# Patient Record
Sex: Female | Born: 1985 | Race: Black or African American | Hispanic: No | Marital: Single | Smoking: Former smoker
Health system: Southern US, Community
[De-identification: ages and names within clinical notes are randomized; demographics above are authoritative.]

## PROBLEM LIST (undated history)

## (undated) DIAGNOSIS — G629 Polyneuropathy, unspecified: Secondary | ICD-10-CM

## (undated) DIAGNOSIS — E7800 Pure hypercholesterolemia, unspecified: Secondary | ICD-10-CM

## (undated) DIAGNOSIS — R51 Headache: Secondary | ICD-10-CM

## (undated) DIAGNOSIS — G8929 Other chronic pain: Secondary | ICD-10-CM

## (undated) DIAGNOSIS — F445 Conversion disorder with seizures or convulsions: Secondary | ICD-10-CM

## (undated) DIAGNOSIS — R42 Dizziness and giddiness: Secondary | ICD-10-CM

## (undated) DIAGNOSIS — M549 Dorsalgia, unspecified: Secondary | ICD-10-CM

## (undated) DIAGNOSIS — R519 Headache, unspecified: Secondary | ICD-10-CM

## (undated) DIAGNOSIS — F32A Depression, unspecified: Secondary | ICD-10-CM

## (undated) DIAGNOSIS — M1990 Unspecified osteoarthritis, unspecified site: Secondary | ICD-10-CM

## (undated) DIAGNOSIS — M797 Fibromyalgia: Secondary | ICD-10-CM

## (undated) DIAGNOSIS — F329 Major depressive disorder, single episode, unspecified: Secondary | ICD-10-CM

## (undated) DIAGNOSIS — G459 Transient cerebral ischemic attack, unspecified: Secondary | ICD-10-CM

## (undated) DIAGNOSIS — R569 Unspecified convulsions: Secondary | ICD-10-CM

## (undated) HISTORY — DX: Pure hypercholesterolemia, unspecified: E78.00

## (undated) HISTORY — DX: Unspecified osteoarthritis, unspecified site: M19.90

## (undated) HISTORY — PX: OVARIAN CYST SURGERY: SHX726

## (undated) HISTORY — DX: Dizziness and giddiness: R42

---

## 2015-02-15 ENCOUNTER — Emergency Department (HOSPITAL_COMMUNITY)
Admission: EM | Admit: 2015-02-15 | Discharge: 2015-02-15 | Disposition: A | Discharge status: Home / Self Care | Payer: Medicare (Managed Care) | Attending: Emergency Medicine | Admitting: Emergency Medicine

## 2015-02-15 ENCOUNTER — Encounter (HOSPITAL_COMMUNITY): Payer: Self-pay | Admitting: *Deleted

## 2015-02-15 DIAGNOSIS — H5711 Ocular pain, right eye: Secondary | ICD-10-CM | POA: Diagnosis present

## 2015-02-15 DIAGNOSIS — F17210 Nicotine dependence, cigarettes, uncomplicated: Secondary | ICD-10-CM | POA: Diagnosis not present

## 2015-02-15 DIAGNOSIS — Z79899 Other long term (current) drug therapy: Secondary | ICD-10-CM | POA: Diagnosis not present

## 2015-02-15 DIAGNOSIS — G629 Polyneuropathy, unspecified: Secondary | ICD-10-CM | POA: Diagnosis not present

## 2015-02-15 DIAGNOSIS — G43109 Migraine with aura, not intractable, without status migrainosus: Secondary | ICD-10-CM | POA: Insufficient documentation

## 2015-02-15 HISTORY — DX: Polyneuropathy, unspecified: G62.9

## 2015-02-15 MED ORDER — DEXAMETHASONE SODIUM PHOSPHATE 10 MG/ML IJ SOLN
10.0000 mg | Freq: Once | INTRAMUSCULAR | Status: AC
  Administered 2015-02-15: 10 mg via INTRAMUSCULAR
  Filled 2015-02-15: qty 1

## 2015-02-15 MED ORDER — FLUORESCEIN SODIUM 1 MG OP STRP
1.0000 | ORAL_STRIP | Freq: Once | OPHTHALMIC | Status: DC
  Filled 2015-02-15: qty 1

## 2015-02-15 MED ORDER — TETRACAINE HCL 0.5 % OP SOLN
2.0000 [drp] | Freq: Once | OPHTHALMIC | Status: DC
  Filled 2015-02-15: qty 2

## 2015-02-15 MED ORDER — BUTALBITAL-APAP-CAFFEINE 50-325-40 MG PO TABS: 1.0000 | ORAL_TABLET | Freq: Four times a day (QID) | ORAL | Status: DC | PRN

## 2015-02-15 MED ORDER — IBUPROFEN 400 MG PO TABS
800.0000 mg | ORAL_TABLET | Freq: Once | ORAL | Status: AC
  Administered 2015-02-15: 800 mg via ORAL
  Filled 2015-02-15: qty 2

## 2015-02-15 MED ORDER — DIPHENHYDRAMINE HCL 25 MG PO CAPS
50.0000 mg | ORAL_CAPSULE | Freq: Once | ORAL | Status: AC
  Administered 2015-02-15: 50 mg via ORAL
  Filled 2015-02-15: qty 2

## 2015-02-15 MED ORDER — KETOROLAC TROMETHAMINE 60 MG/2ML IM SOLN
60.0000 mg | Freq: Once | INTRAMUSCULAR | Status: AC
  Administered 2015-02-15: 60 mg via INTRAMUSCULAR
  Filled 2015-02-15: qty 2

## 2015-02-15 NOTE — ED Notes (Signed)
PT [facility] . Pt . Pt is now here today with same pain in the RT eye . Pt reports blurred vision in Rt .

## 2015-02-15 NOTE — ED Notes (Signed)
Declined W/C at D/C and was escorted to lobby by RN. 

## 2015-02-15 NOTE — ED Provider Notes (Signed)
CSN: [id number]     Arrival date & time 02/15/15  [id number] History  By signing my name below, I, Budd PalmerVanessa Prueter, attest that this documentation has been prepared under the direction and in the presence of PA Fayrene HelperBowie Valdez Brannan. Electronically Signed: Budd PalmerVanessa Prueter, ED Scribe. 02/15/2015. 9:29 AM.    Chief Complaint  Patient presents with  . Eye Pain   The history is provided by the patient and a relative. No language interpreter was used.   HPI Comments: Angela Doyle is a 29 y.o. female who presents to the Emergency Department complaining of right eye pain onset 2 weeks ago. She reports associated HA, neck pain, and right-sided facial pain. Pt states she was seen in the ED in [place] one week ago, where she was diagnosed with a migraine. She notes at the time, she had right sided eye pressure, as well as right sided facial and neck pain. She notes that at the time she was given anti-nausea medication and a percocet before being discharged. She reports a PMHx of migraines, but states that this feels different. She states that since being seen, the pain has progressively worsened, and she has been having vision problems as well. She notes a PMHx of seizures, with her last one occuring 3.5 weeks ago, per aunt. She notes pt just moved here and does not have a neurologist in the area yet. She states she is taking Kepra and Neurontin for this. Pt denies fever, eye discharge, and rash. Does wear corrective lenses.  Past Medical History  Diagnosis Date  . Seizures (HCC)   . Neuropathy [facility](HCC)    Past Surgical History  Procedure Laterality Date  . Cesarean section  2011   History reviewed. No pertinent family history. Social History  Substance Use Topics  . Smoking status: Current Every Day Smoker    Types: Cigarettes  . Smokeless tobacco: Never Used  . Alcohol Use: No   OB History    No data available     Review of Systems  All other systems reviewed and are negative.     Allergies   Pineapple  Home Medications   Prior to Admission medications   Medication Sig Start Date End Date Taking? Authorizing Provider  gabapentin (NEURONTIN) 300 MG capsule Take 300 mg by mouth 2 (two) times daily.   Yes Historical Provider, MD  levETIRAcetam (KEPPRA) 500 MG tablet Take 1,500 mg by mouth 2 (two) times daily.   Yes Historical Provider, MD   BP 131/79 mmHg  Pulse 78  Temp(Src) 98.3 F (36.8 C) (Oral)  Resp 18  Ht 5\' 2"  (1.575 m)  Wt 220 lb (99.791 kg)  BMI 40.23 kg/m2  SpO2 97%  LMP 02/15/2015 Physical Exam  Constitutional: She is oriented to person, place, and time. She appears well-developed and well-nourished.  HENT:  Head: Normocephalic and atraumatic.  Eyes: Conjunctivae and EOM are normal. Pupils are equal, round, and reactive to light. Right eye exhibits no discharge. Left eye exhibits no discharge. No scleral icterus.  Lids everted, no foreign object noted, no crusting.  Neck: Normal range of motion.  No nuchal rigidity  Pulmonary/Chest: Effort normal. No respiratory distress.  Neurological: She is alert and oriented to person, place, and time. She has normal strength. No cranial nerve deficit or sensory deficit. Coordination normal. GCS eye subscore is 4. GCS verbal subscore is 5. GCS motor subscore is 6.  Normal grip strength  Skin: Skin is warm and dry. No rash noted. She is not diaphoretic. No  erythema.  Psychiatric: She has a normal mood and affect.  Nursing note and vitals reviewed.   ED Course  Procedures  DIAGNOSTIC STUDIES: Oxygen Saturation is 97% on RA, adequate by my interpretation.    COORDINATION OF CARE: 9:29 AM - Discussed probable migraine. Discussed plans to order a migraine cocktail and observe. Will order fioricet. Pt advised of plan for treatment and pt agrees.    MDM   Final diagnoses:  Complicated migraine    BP 131/79 mmHg  Pulse 78  Temp(Src) 98.3 F (36.8 C) (Oral)  Resp 18  Ht  (1.575 m)  Wt 99.791 kg  BMI  40.23 kg/m2  SpO2 97%  LMP 02/15/2015  I personally performed the services described in this documentation, which was scribed in my presence. The recorded information has been reviewed and is accurate.     9:50 AM Patient presents with persistent right-sided face pain with eye pain and neck pain. She also endorsed headache. She is afebrile, vital signs stable, low suspicion for meningitis. Her eye exam is unremarkable. I suspect that this is a complicated Migraine. She has no focal neuro deficit. Will treat symptoms and refer patient to neurologist for further management. Return precaution discussed. Migraine cocktail given in the ED with improvement.    Fayrene Helper, PA-C 02/15/15 1003  Azalia Bilis, MD 02/15/15 [phone]

## 2015-02-15 NOTE — Discharge Instructions (Signed)
Recurrent Migraine Headache A migraine headache is an intense, throbbing pain on one or both sides of your head. Recurrent migraines keep coming back. A migraine can last for 30 minutes to several hours. CAUSES  The exact cause of a migraine headache is not always known. However, a migraine may be caused when nerves in the brain become irritated and release chemicals that cause inflammation. This causes pain. Certain things may also trigger migraines, such as:   Alcohol.  Smoking.  Stress.  Menstruation.  Aged cheeses.  Foods or drinks that contain nitrates, glutamate, aspartame, or tyramine.  Lack of sleep.  Chocolate.  Caffeine.  Hunger.  Physical exertion.  Fatigue.  Medicines used to treat chest pain (nitroglycerine), birth control pills, estrogen, and some blood pressure medicines. SYMPTOMS   Pain on one or both sides of your head.  Pulsating or throbbing pain.  Severe pain that prevents daily activities.  Pain that is aggravated by any physical activity.  Nausea, vomiting, or both.  Dizziness.  Pain with exposure to bright lights, loud noises, or activity.  General sensitivity to bright lights, loud noises, or smells. Before you get a migraine, you may get warning signs that a migraine is coming (aura). An aura may include:  Seeing flashing lights.  Seeing bright spots, halos, or zigzag lines.  Having tunnel vision or blurred vision.  Having feelings of numbness or tingling.  Having trouble talking.  Having muscle weakness. DIAGNOSIS  A recurrent migraine headache is often diagnosed based on:  Symptoms.  Physical examination.  A CT scan or MRI of your head. These imaging tests cannot diagnose migraines but can help rule out other causes of headaches.  TREATMENT  Medicines may be given for pain and nausea. Medicines can also be given to help prevent recurrent migraines. HOME CARE INSTRUCTIONS  Only take over-the-counter or prescription  medicines for pain or discomfort as directed by your health care provider. The use of long-term narcotics is not recommended.  Lie down in a dark, quiet room when you have a migraine.  Keep a journal to find out what may trigger your migraine headaches. For example, write down:  What you eat and drink.  How much sleep you get.  Any change to your diet or medicines.  Limit alcohol consumption.  Quit smoking if you smoke.  Get 7-9 hours of sleep, or as recommended by your health care provider.  Limit stress.  Keep lights dim if bright lights bother you and make your migraines worse. SEEK MEDICAL CARE IF:   You do not get relief from the medicines given to you.  You have a recurrence of pain.  You have a fever. SEEK IMMEDIATE MEDICAL CARE IF:  Your migraine becomes severe.  You have a stiff neck.  You have loss of vision.  You have muscular weakness or loss of muscle control.  You start losing your balance or have trouble walking.  You feel faint or pass out.  You have severe symptoms that are different from your first symptoms. MAKE SURE YOU:   Understand these instructions.  Will watch your condition.  Will get help right away if you are not doing well or get worse.   This information is not intended to replace advice given to you by your health care provider. Make sure you discuss any questions you have with your health care provider.   Document Released: 11/09/2000 Document Revised: 03/07/2014 Document Reviewed: 10/22/2012 Elsevier Interactive Patient Education [employer]   Emergency Department  Resource Guide 1) Find a Librarian, academic and Pay Out of Pocket Although you won't have to find out who is covered by your insurance plan, it is a good idea to ask around and get recommendations. You will then need to call the office and see if the doctor you have chosen will accept you as a new patient and what types of options they offer for patients who are  self-pay. Some doctors offer discounts or will set up payment plans for their patients who do not have insurance, but you will need to ask so you aren't surprised when you get to your appointment.  2) Contact Your Local Health Department Not all health departments have doctors that can see patients for sick visits, but many do, so it is worth a call to see if yours does. If you don't know where your local health department is, you can check in your phone book. The CDC also has a tool to help you locate your state's health department, and many state websites also have listings of all of their local health departments.  3) Find a Walk-in Clinic If your illness is not likely to be very severe or complicated, you may want to try a walk in clinic. These are popping up all over the country in pharmacies, drugstores, and shopping centers. They're usually staffed by nurse practitioners or physician assistants that have been trained to treat common illnesses and complaints. They're usually fairly quick and inexpensive. However, if you have serious medical issues or chronic medical problems, these are probably not your best option.  No Primary Care Doctor: - Call Health Connect at  [phone] - they can help you locate a primary care doctor that  accepts your insurance, provides certain services, etc. - Physician Referral Service- [phone]  Chronic Pain Problems: Organization         Address  Phone   Notes  [facility]  [phone] Patients need to be referred by their primary care doctor.   Medication Assistance: Organization         Address  Phone   Notes  [facility] Medication [facility] [address] [place] [place], [place] [address] [phone] --Must be a resident of [facility] -- Must have NO insurance coverage whatsoever (no Medicaid/ Medicare, etc.) -- The pt. MUST have a primary care doctor that directs their care regularly and follows them in  the community   [facility]  [phone]   [employer]  [phone]    Agencies that provide inexpensive medical care: Organization         Address  Phone   Notes  [facility]  [phone]   [facility]    [phone]   [facility] [address] [place], [place] [address] [phone]   [facility] [address] [place] [place] [phone]   Planned Parenthood    [phone]   [facility]    [phone]   [facility]  [address], [place] Phone:  [phone], Fax:  [phone] Hours of Operation:  9 am - 6 pm, M-F.  Also accepts Medicaid/Medicare and self-pay.  [facility]  [address], [place] Phone: [phone], Fax: [phone]. Hours of Operation:  8:30 am - 5:30 pm, M-F.  Also accepts Medicaid and self-pay.  [facility] [place] [address]  [place] Phone: [phone]   [facility] [address] Natasha Bence [place], [place] [phone], Ext. [phone] Mondays & Thursdays: 7-9 AM.  First 15 patients are seen on a first come, first serve basis.    Medicaid-accepting [facility] Providers:  Organization         Address  Phone   Notes  [facility] [address], [place] [phone] Also accepts self-pay patients.  [facility] [address] Laurell Josephs [place], [place]  [phone]   [facility] [address], [place] [phone]   [facility] [address], [place] [phone]   Renaye Rakers [address], [place]   [phone] Only accepts [place] patients after they have their name applied to their card.   Self-Pay (no insurance) in [facility]:  Organization         Address  Phone   Notes  Sickle Cell Patients,  [facility] [address] [place], [place] [phone]   [facility] [address] [place], [place] [phone]   [facility]  [address], [place] [phone]   [facility]/Dr. Osei-Bonsu  [address], [place] or [address], [place] [phone] Phone number for both [place] and [place] locations is the same.  [facility] [address] [place] [phone]   [facility] [address], [place] or [address] [phone]   [facility] [address], [place] [phone], phone; [phone], fax Sees patients 1st and 3rd Saturday of every month.  Must not qualify for public or private insurance (i.e. Medicaid, Medicare, [place] Health Choice, Veterans' Benefits)  Household income should be no more than 200% of the poverty level The clinic cannot treat you if you are pregnant or think you are pregnant  Sexually transmitted diseases are not treated at the clinic.    Dental Care: Organization         Address  Phone  Notes  [facility] Department of [facility] [address] [place], [place] [phone] Accepts children up to age 72 who are enrolled in [place] or [place] Health Choice; pregnant women with a Medicaid card; and children who have applied for Medicaid or [place] Health Choice, but were declined, whose parents can pay a reduced fee at time of service.  [facility] of [facility]  [address], [place] [phone] Accepts children up to age 21 who are enrolled in [place] or [place] Health Choice; pregnant women with a Medicaid card; and children who have applied for Medicaid or [place] Health Choice, but were declined, whose parents can pay a reduced fee at time of service.  [facility]   [address] [place], [place] [phone] Patients are seen by appointment only. Walk-ins are not accepted. [facility] will see patients 66 years of age and older. Monday - Tuesday (8am-5pm) Most Wednesdays (8:30-5pm) $30 per visit, cash only  [facility]  [address], [facility] [phone] Patients are seen by appointment only. Walk-ins are not accepted. [facility] will see patients 98 years of age and older. One Wednesday  Evening (Monthly: Volunteer Based).  $30 per visit, cash only  [employer]  [phone] for adults; Children under age 13, call Graduate Pediatric Dentistry at [phone]. Children aged 59-14, please call [phone] to request a pediatric application.  Dental services are provided in all areas of dental care including fillings, crowns and bridges, complete and partial dentures, implants, gum treatment, root canals, and extractions. Preventive care is also provided. Treatment is provided to both adults and children. Patients are selected via a lottery and there is often a waiting list.   [facility] [address], [place]  [phone] [email or web address]   [facility] [address] [place], [place] [phone], Ext. [phone] Second and Fourth Thursday of each month, opens at 6:30 AM; Clinic ends at 9 AM.  Patients are seen on a first-come first-served basis, and a limited number are seen during each clinic.   [facility]  [address] Ether Griffins [place], [place] [phone]   Eligibility Requirements You must have lived in [place], [place], or [place] counties for at least the last three months.   You cannot be eligible for state or federal sponsored [place], including [employer], [place], or [employer].   You generally cannot be eligible for healthcare insurance through your employer.    How to apply: Eligibility screenings are  held every Tuesday and Wednesday afternoon from 1:00 pm until 4:00 pm. You do not need an appointment for the interview!  [facility] [address] [place], [place] [phone]   [facility] Health Department  [phone]   [facility] Health Department  [phone]   [facility] Health Department  [phone]    Behavioral Health Resources in the Community: Intensive Outpatient Programs Organization         Address  Phone  Notes  [facility] [address] [place], [place] [phone]   [facility] Outpatient [address] [place], [place] [phone]   [facility]: [facility] [address] [place], [place]  [phone]   [facility] Mental Health [address],  [place], [place] [phone] or [phone]   Substance Abuse Resources Organization         Address  Phone  Notes  [facility] Drug Services  [phone]   [facility]  [phone]   [facility] [place]  [phone]   Floydene Flock  [phone]   Residential & Outpatient Substance Abuse Program  [phone]   Psychological Services Organization         Address  Phone  Notes  [facility] Behavioral Health  [phone]   [facility]  [phone]   [facility] Mental Health [address] [place] [phone] or [phone]    Mobile Crisis Teams Organization         Address  Phone  Notes  [facility], Mobile Crisis Care Unit  [phone]   [facility]  [address] [place], [place] [phone]   Doristine Locks [address] [place] [phone]    Self-Help/Support Groups Organization         Address  Phone             Notes  [facility] [place] - variety of support groups  336- I7437963 Call for more information  Narcotics Anonymous (NA), [facility] [address], [employer] [place]  2 meetings at this location  Residential Treatment Programs Organization         Address  Phone  Notes  [facility] [address],    [place]  [phone]   [facility]  [address] [place] [phone], [place], [place] [phone]   [facility] [address], [place] [phone] Admissions: 8am-3pm M-F  [facility] [address]    [place], [place] [phone]   [facility] [address], [place], [place] [phone]   [facility] [address]  [place], [place] [phone]   [facility] - Intensive Outpatient [address], [place], [place] [phone]   [facility] ([facility] [address]  [place], [place] [phone] or [phone]   [facility] ([facility]) [address] [place], [place] [phone] Accepts Medicaid  [facility] [address]  [place] [phone] Substance Abuse/Addiction Treatment   [facility] Behavioral Health Resources Organization         Address  Phone  Notes  [facility]  [phone]) [phone]   Angie FavaJulie Brannon, PhD [address] [place], [place]   [phone](336) [phone] or [phone](336) [phone]   [facility]   [address] [place], [place] [phone]) [phone]   [facility] [address], [place], [place] [phone]) [phone] Insurance/Medicaid/sponsorship through [facility]  [facility] [address]                                    [place], [place] [phone]) [phone] Therapy/tele-psych/case  [facility] [address]   [place], [place] [phone](336) [phone]    Dr. Lolly MustacheArfeen  [phone](336) [phone]   [facility]  [employer] [facility] 1) [address], [place] 2) [address], [place] 3)  [address], [place] [phone](336) [phone](336) [phone]  [phone]([phone]   [facility] Child Abuse Hotline [phone](336) [phone] or [phone](336) [phone] (After  Hours)

## 2015-03-25 ENCOUNTER — Emergency Department (HOSPITAL_COMMUNITY)
Admission: EM | Admit: 2015-03-25 | Discharge: 2015-03-26 | Disposition: A | Discharge status: Home / Self Care | Payer: Medicaid Other | Attending: Emergency Medicine | Admitting: Emergency Medicine

## 2015-03-25 ENCOUNTER — Encounter (HOSPITAL_COMMUNITY): Payer: Self-pay | Admitting: Emergency Medicine

## 2015-03-25 DIAGNOSIS — F17210 Nicotine dependence, cigarettes, uncomplicated: Secondary | ICD-10-CM | POA: Insufficient documentation

## 2015-03-25 DIAGNOSIS — G629 Polyneuropathy, unspecified: Secondary | ICD-10-CM | POA: Insufficient documentation

## 2015-03-25 DIAGNOSIS — Z79899 Other long term (current) drug therapy: Secondary | ICD-10-CM | POA: Insufficient documentation

## 2015-03-25 DIAGNOSIS — Z760 Encounter for issue of repeat prescription: Secondary | ICD-10-CM | POA: Insufficient documentation

## 2015-03-25 MED ORDER — LACOSAMIDE 50 MG PO TABS
100.0000 mg | ORAL_TABLET | Freq: Once | ORAL | Status: AC
  Administered 2015-03-25: 100 mg via ORAL
  Filled 2015-03-25: qty 2

## 2015-03-25 NOTE — ED Notes (Signed)
Patient alert, oriented, in no acute distress

## 2015-03-25 NOTE — ED Notes (Signed)
Patient visitor at bedside given peanut butter & saltine crackers per request.

## 2015-03-25 NOTE — ED Notes (Signed)
Patient presents for medication refill for vimpat. Ran out two days ago, setting up medicaid. No seizure since running out of vimpat. Patient A&O x4, ambulatory with steady gait in triage. C/o HA, denies visual changes.

## 2015-03-26 MED ORDER — LEVETIRACETAM 500 MG PO TABS: 1500.0000 mg | ORAL_TABLET | Freq: Two times a day (BID) | ORAL | Status: DC

## 2015-03-26 MED ORDER — LACOSAMIDE 100 MG PO TABS: 100.0000 mg | ORAL_TABLET | Freq: Two times a day (BID) | ORAL | Status: DC

## 2015-03-26 NOTE — Discharge Instructions (Signed)
Medicine Refill at the Emergency Department  We have refilled your medicine today, but it is best for you to get refills through your primary health care provider's office. In the future, please plan ahead so you do not need to get refills from the emergency department.  If the medicine we refilled was a maintenance medicine, you may have received only enough to get you by until you are able to see your regular health care provider.     This information is not intended to replace advice given to you by your health care provider. Make sure you discuss any questions you have with your health care provider.     Document Released: 06/03/2003 Document Revised: 03/07/2014 Document Reviewed: 05/24/2013  Elsevier Interactive Patient Education 2016 Elsevier Inc.

## 2015-03-26 NOTE — ED Provider Notes (Signed)
CSN: [id number]     Arrival date & time 03/25/15  2115 History   First MD Initiated Contact with Patient 03/25/15 2254     Chief Complaint  Patient presents with  . Medication Refill     (Consider location/radiation/quality/duration/timing/severity/associated sxs/prior Treatment) HPI Comments: Patient presents to the emergency department with chief complaint of medication refill. She states that she is new to the area. She has run out of her Vimpat. She takes this for seizures. She has not had her medication several days. She denies any new seizures. She denies any symptoms at this time. She states that occasionally she feels jittery. She denies any fevers, chills, recent illness.  The history is provided by the patient. No language interpreter was used.    Past Medical History  Diagnosis Date  . Seizures (HCC)   . Neuropathy [facility])    Past Surgical History  Procedure Laterality Date  . Cesarean section  2011   No family history on file. Social History  Substance Use Topics  . Smoking status: Current Every Day Smoker    Types: Cigarettes  . Smokeless tobacco: Never Used  . Alcohol Use: No   OB History    No data available     Review of Systems  All other systems reviewed and are negative.     Allergies  Pineapple  Home Medications   Prior to Admission medications   Medication Sig Start Date End Date Taking? Authorizing Provider  butalbital-acetaminophen-caffeine (FIORICET) [phone] MG tablet Take 1-2 tablets by mouth every 6 (six) hours as needed for headache. 02/15/15 02/15/16 Yes Fayrene Helper, PA-C  cyclobenzaprine (FLEXERIL) 10 MG tablet Take 10 mg by mouth 3 (three) times daily as needed for muscle spasms.    Yes Historical Provider, MD  gabapentin (NEURONTIN) 300 MG capsule Take 300 mg by mouth 3 (three) times daily.    Yes Historical Provider, MD  levETIRAcetam (KEPPRA) 500 MG tablet Take 1,500 mg by mouth 2 (two) times daily.   Yes Historical Provider, MD   meclizine (ANTIVERT) 12.5 MG tablet Take 12.5 mg by mouth 3 (three) times daily as needed for dizziness.   Yes Historical Provider, MD  sertraline (ZOLOFT) 100 MG tablet Take 100 mg by mouth daily.   Yes Historical Provider, MD  Lacosamide (VIMPAT) 100 MG TABS Take 1 tablet (100 mg total) by mouth 2 (two) times daily. 03/26/15   Roxy Horseman, PA-C   BP 113/77 mmHg  Pulse 74  Temp(Src) 98.5 F (36.9 C) (Oral)  Resp 16  SpO2 98% Physical Exam  Constitutional: She is oriented to person, place, and time. She appears well-developed and well-nourished.  HENT:  Head: Normocephalic and atraumatic.  Eyes: Conjunctivae and EOM are normal. Pupils are equal, round, and reactive to light.  Neck: Normal range of motion. Neck supple.  Cardiovascular: Normal rate and regular rhythm.  Exam reveals no gallop and no friction rub.   No murmur heard. Pulmonary/Chest: Effort normal and breath sounds normal. No respiratory distress. She has no wheezes. She has no rales. She exhibits no tenderness.  Abdominal: Soft. Bowel sounds are normal. She exhibits no distension and no mass. There is no tenderness. There is no rebound and no guarding.  Musculoskeletal: Normal range of motion. She exhibits no edema or tenderness.  Neurological: She is alert and oriented to person, place, and time.  Skin: Skin is warm and dry.  Psychiatric: She has a normal mood and affect. Her behavior is normal. Judgment and thought content normal.  Nursing note and vitals reviewed.   ED Course  Procedures (including critical care time)   MDM   Final diagnoses:  Medication refill    Patient here for refill of seizure medicine. Refill in the ED. Recommend primary care/neurology follow-up. Patient understands and agrees with plan. She is stable and ready for discharge.    Roxy Horseman, PA-C 03/26/15 0015  Cy Blamer, MD 03/26/15 [phone]

## 2015-03-26 NOTE — ED Notes (Signed)
Discharge instructions, follow up care, and rx x2 reviewed with patient. Patient verbalized understanding. 

## 2015-03-30 ENCOUNTER — Encounter (HOSPITAL_COMMUNITY): Payer: Self-pay

## 2015-03-30 ENCOUNTER — Emergency Department (HOSPITAL_COMMUNITY)
Admission: EM | Admit: 2015-03-30 | Discharge: 2015-03-30 | Disposition: A | Discharge status: Home / Self Care | Payer: Medicaid Other | Attending: Emergency Medicine | Admitting: Emergency Medicine

## 2015-03-30 DIAGNOSIS — F17210 Nicotine dependence, cigarettes, uncomplicated: Secondary | ICD-10-CM | POA: Insufficient documentation

## 2015-03-30 DIAGNOSIS — G40909 Epilepsy, unspecified, not intractable, without status epilepticus: Secondary | ICD-10-CM | POA: Insufficient documentation

## 2015-03-30 DIAGNOSIS — Z79899 Other long term (current) drug therapy: Secondary | ICD-10-CM | POA: Diagnosis not present

## 2015-03-30 DIAGNOSIS — R569 Unspecified convulsions: Secondary | ICD-10-CM | POA: Diagnosis present

## 2015-03-30 DIAGNOSIS — Z3202 Encounter for pregnancy test, result negative: Secondary | ICD-10-CM | POA: Insufficient documentation

## 2015-03-30 LAB — BLOOD GAS, VENOUS

## 2015-03-30 LAB — I-STAT VENOUS BLOOD GAS, ED
ACID-BASE DEFICIT: 2 mmol/L (ref 0.0–2.0)
Bicarbonate: 24.8 mEq/L — ABNORMAL HIGH (ref 20.0–24.0)
O2 SAT: 46 %
PCO2 VEN: 50.8 mmHg — AB (ref 45.0–50.0)
TCO2: 26 mmol/L (ref 0–100)
pH, Ven: 7.297 (ref 7.250–7.300)
pO2, Ven: 28 mmHg — CL (ref 30.0–45.0)

## 2015-03-30 LAB — COMPREHENSIVE METABOLIC PANEL
ALBUMIN: 3.9 g/dL (ref 3.5–5.0)
ALK PHOS: 82 U/L (ref 38–126)
ALT: 24 U/L (ref 14–54)
AST: 24 U/L (ref 15–41)
Anion gap: 13 (ref 5–15)
BILIRUBIN TOTAL: 0.7 mg/dL (ref 0.3–1.2)
BUN: 9 mg/dL (ref 6–20)
CO2: 24 mmol/L (ref 22–32)
CREATININE: 0.94 mg/dL (ref 0.44–1.00)
Calcium: 9.4 mg/dL (ref 8.9–10.3)
Chloride: 105 mmol/L (ref 101–111)
GFR calc Af Amer: >60 mL/min (ref >60)
GFR calc non Af Amer: >60 mL/min (ref >60)
GLUCOSE: 98 mg/dL (ref 65–99)
POTASSIUM: 4 mmol/L (ref 3.5–5.1)
Sodium: 142 mmol/L (ref 135–145)
TOTAL PROTEIN: 7.3 g/dL (ref 6.5–8.1)

## 2015-03-30 LAB — RAPID URINE DRUG SCREEN, HOSP PERFORMED
Amphetamines: NOT DETECTED
BARBITURATES: NOT DETECTED
Benzodiazepines: POSITIVE — AB
Cocaine: NOT DETECTED
OPIATES: NOT DETECTED
Tetrahydrocannabinol: NOT DETECTED

## 2015-03-30 LAB — CBC WITH DIFFERENTIAL/PLATELET
BASOS ABS: 0 10*3/uL (ref 0.0–0.1)
Basophils Relative: 0 %
EOS PCT: 1 %
Eosinophils Absolute: 0.1 10*3/uL (ref 0.0–0.7)
HCT: 43.8 % (ref 36.0–46.0)
Hemoglobin: 15 g/dL (ref 12.0–15.0)
LYMPHS PCT: 35 %
Lymphs Abs: 2.4 10*3/uL (ref 0.7–4.0)
MCH: 31.7 pg (ref 26.0–34.0)
MCHC: 34.2 g/dL (ref 30.0–36.0)
MCV: 92.6 fL (ref 78.0–100.0)
MONO ABS: 0.7 10*3/uL (ref 0.1–1.0)
Monocytes Relative: 11 %
Neutro Abs: 3.5 10*3/uL (ref 1.7–7.7)
Neutrophils Relative %: 53 %
PLATELETS: 185 10*3/uL (ref 150–400)
RBC: 4.73 MIL/uL (ref 3.87–5.11)
RDW: 12.8 % (ref 11.5–15.5)
WBC: 6.7 10*3/uL (ref 4.0–10.5)

## 2015-03-30 LAB — POC URINE PREG, ED: PREG TEST UR: NEGATIVE

## 2015-03-30 LAB — CK: CK TOTAL: 439 U/L — AB (ref 38–234)

## 2015-03-30 LAB — CBG MONITORING, ED: Glucose-Capillary: 87 mg/dL (ref 65–99)

## 2015-03-30 MED ORDER — SODIUM CHLORIDE 0.9 % IV SOLN
200.0000 mg | Freq: Two times a day (BID) | INTRAVENOUS | Status: DC
  Administered 2015-03-30: 200 mg via INTRAVENOUS
  Filled 2015-03-30 (×2): qty 20

## 2015-03-30 MED ORDER — SODIUM CHLORIDE 0.9 % IV SOLN
1000.0000 mg | Freq: Once | INTRAVENOUS | Status: DC
  Filled 2015-03-30: qty 10

## 2015-03-30 MED ORDER — FENTANYL CITRATE (PF) 100 MCG/2ML IJ SOLN
100.0000 ug | Freq: Once | INTRAMUSCULAR | Status: AC
  Administered 2015-03-30: 100 ug via INTRAVENOUS
  Filled 2015-03-30: qty 2

## 2015-03-30 MED ORDER — MECLIZINE HCL 25 MG PO TABS: 12.5000 mg | ORAL_TABLET | Freq: Two times a day (BID) | ORAL | Status: DC | PRN

## 2015-03-30 MED ORDER — LEVETIRACETAM 500 MG PO TABS: 1500.0000 mg | ORAL_TABLET | Freq: Two times a day (BID) | ORAL | Status: DC

## 2015-03-30 MED ORDER — MECLIZINE HCL 12.5 MG PO TABS: 12.5000 mg | ORAL_TABLET | Freq: Three times a day (TID) | ORAL | Status: DC | PRN

## 2015-03-30 MED ORDER — LACOSAMIDE 100 MG PO TABS: 1.0000 | ORAL_TABLET | Freq: Two times a day (BID) | ORAL | Status: DC

## 2015-03-30 NOTE — ED Notes (Signed)
Pt verbal at this time but continues to have involuntary movements/twitching of neck.  MD made aware.

## 2015-03-30 NOTE — Discharge Planning (Signed)
Pt provided savings card for Vimpat.  Pt aware that she may visit [facility] to fill Rx and apply for additional savings programs.

## 2015-03-30 NOTE — ED Notes (Signed)
Phlebotomy at bedside.

## 2015-03-30 NOTE — Progress Notes (Signed)
Spoke to patient regarding primary care resources and the [facility] orange card. Patient has not met the minimum requirement of being a [employer] resident at this time to be a candidate for the orange card. Follow up appointment made with the [facility] for Friday March 3,2017 @ 10:45am, pt verbalized understanding of the upcoming appointment. Family at bedside states they have started the medicaid process here in [place]. Patient was instructed to contact me in 2 weeks if no response was given from medicaid to proceed with orange card process. My contact information provided for any future questions or concerns. No other [place] needs identified at this time. NCM C.Wood consulted regarding patients medications.   [place] Specialist [facility] [phone]

## 2015-03-30 NOTE — Discharge Instructions (Signed)
Epilepsy Call the sickle cell clinic tomorrow arrange to get it primary care physician. You may get your medications tomorrow at the [facility] and [facility] People with epilepsy have times when they shake and jerk uncontrollably (seizures). This happens when there is a sudden change in brain function. Epilepsy may have many possible causes. Anything that disturbs the normal pattern of brain cell activity can lead to seizures. HOME CARE   Follow your doctor's instructions about driving and safety during normal activities.  Get enough sleep.  Only take medicine as told by your doctor.  Avoid things that you know can cause you to have seizures (triggers).  Write down when your seizures happen and what you remember about each seizure. Write down anything you think may have caused the seizure to happen.  Tell the people you live and work with that you have seizures. Make sure they know how to help you. They should:  Cushion your head and body.  Turn you on your side.  Not restrain you.  Not place anything inside your mouth.  Call for local emergency medical help if there is any question about what has happened.  Keep all follow-up visits with your doctor. This is very important. GET HELP IF:  You get an infection or start to feel sick. You may have more seizures when you are sick.  You are having seizures more often.  Your seizure pattern is changing. GET HELP RIGHT AWAY IF:   A seizure does not stop after a few seconds or minutes.  A seizure causes you to have trouble breathing.  A seizure gives you a very bad headache.  A seizure makes you unable to speak or use a part of your body.   This information is not intended to replace advice given to you by your health care provider. Make sure you discuss any questions you have with your health care provider.   Document Released: 12/12/2008 Document Revised: 12/05/2012 Document Reviewed: 09/26/2012 Elsevier  Interactive Patient Education [employer]

## 2015-03-30 NOTE — ED Notes (Signed)
Pt brought in EMS for 3 seizures today.  Pt is normally on Keppra and Vimpac but family reports pt has been out of medications due to insurance issues.  Seizures are tonic clonic and pt had 1 witnessed seizure with EMS.  Pt given 2.5mg  Valium and is sleeping with even and unlabored respirations upon arrival

## 2015-03-30 NOTE — ED Provider Notes (Signed)
CSN: [id number]     Arrival date & time 03/30/15  1128 History   First MD Initiated Contact with Patient 03/30/15 1152     Chief Complaint  Patient presents with  . Seizures     (Consider location/radiation/quality/duration/timing/severity/associated sxs/prior Treatment) HPI Level V caveat patient unresponsive history is obtained from EMS here to I attempted to call patient's telephone, no answer. Patient reportedly had 3 seizures while at home today. She had been out of Keppra and Vimpat 1 week. EMS treated patient with Valium 2.5 mill igrams IV in the field.  Past Medical History  Diagnosis Date  . Seizures (HCC)   . Neuropathy [facility])    Past Surgical History  Procedure Laterality Date  . Cesarean section  2011   History reviewed. No pertinent family history. Social History  Substance Use Topics  . Smoking status: Current Every Day Smoker    Types: Cigarettes  . Smokeless tobacco: Never Used  . Alcohol Use: No   OB History    No data available     Review of Systems  Unable to perform ROS: Patient unresponsive  Neurological: Positive for seizures.      Allergies  Pineapple  Home Medications   Prior to Admission medications   Medication Sig Start Date End Date Taking? Authorizing Provider  butalbital-acetaminophen-caffeine (FIORICET) [phone] MG tablet Take 1-2 tablets by mouth every 6 (six) hours as needed for headache. 02/15/15 02/15/16  Fayrene Helper, PA-C  cyclobenzaprine (FLEXERIL) 10 MG tablet Take 10 mg by mouth 3 (three) times daily as needed for muscle spasms.     Historical Provider, MD  gabapentin (NEURONTIN) 300 MG capsule Take 300 mg by mouth 3 (three) times daily.     Historical Provider, MD  Lacosamide (VIMPAT) 100 MG TABS Take 1 tablet (100 mg total) by mouth 2 (two) times daily. 03/26/15   Roxy Horseman, PA-C  levETIRAcetam (KEPPRA) 500 MG tablet Take 3 tablets (1,500 mg total) by mouth 2 (two) times daily. 03/26/15   Roxy Horseman, PA-C  meclizine  (ANTIVERT) 12.5 MG tablet Take 12.5 mg by mouth 3 (three) times daily as needed for dizziness.    Historical Provider, MD  sertraline (ZOLOFT) 100 MG tablet Take 100 mg by mouth daily.    Historical Provider, MD   BP 114/80 mmHg  Pulse 71  Temp(Src) 97.9 F (36.6 C) (Oral)  Resp 25  Ht  (1.702 m)  Wt 210 lb (95.255 kg)  BMI 32.88 kg/m2  SpO2 98% Physical Exam  Constitutional:  Unresponsive. Opens eyes to noxious stimulus. Does not follow commands. Nonverbal. Glasgow Coma Score equals 7  HENT:  Head: Normocephalic and atraumatic.  Eyes: Conjunctivae are normal.  Pupils 2 mm reactive to light  Neck: Neck supple. No tracheal deviation present. No thyromegaly present.  Cardiovascular: Normal rate and regular rhythm.   No murmur heard. Pulmonary/Chest: Effort normal and breath sounds normal.  Abdominal: Soft. Bowel sounds are normal. She exhibits no distension. There is no tenderness.  Obese  Musculoskeletal: Normal range of motion. She exhibits no edema or tenderness.  Neurological: Coordination normal.  Unresponsive Glasgow Coma Scale score 7spontaneous movement of her extremities. Does not follow commands  Skin: Skin is warm and dry. No rash noted.  Nursing note and vitals reviewed.  12:20 PM patient noted have mild generalized seizure activity. She is managing her airway.. Intravenous Vimpat ordered.  Patient's aunt with whom she lives arrived and provided further history. Patient has not had any Vimpat in 13 days.  She currently does have Keppra and gabapentin has only for Keppra tablets left but has had this morning's dose of Keppra. She's had similar episodes in the past after missing her medications.   1:40 AM patient is alert Glasgow Coma Score 15 complains of diffuse body aches. No longer having seizure activity after treatment with intravenous Vimpat  At 430 pm pt alert ambulates without assistance and feels ready to go home. Alert gcs 15Pain improved after treatment  with iv opioid Case mangement was consulted and made arrangements for pt to get Rxs for vimpat and keppra free of charge . Walker offered to pt as she had one  When sheli ved in [place] but she declined it ED Course  Procedures (including critical care time) Labs Review Labs Reviewed  CBG MONITORING, ED    Imaging Review No results found. I have personally reviewed and evaluated these images and lab results as part of my medical decision-making.   EKG Interpretation None     Results for orders placed or performed during the hospital encounter of 03/30/15  Blood gas, venous  Result Value Ref Range   FIO2 TEST WILL BE CREDITED    O2 Content TEST WILL BE CREDITED L/min   Delivery systems TEST WILL BE CREDITED    Mode TEST WILL BE CREDITED    VT TEST WILL BE CREDITED mL   LHR TEST WILL BE CREDITED resp/min   Hi Frequency JET Vent Rate TEST WILL BE CREDITED    Peep/cpap TEST WILL BE CREDITED cm H20   PIP TEST WILL BE CREDITED cm H2O   Hi Frequency JET Vent PIP TEST WILL BE CREDITED    Pressure support TEST WILL BE CREDITED cm H20   Pressure control TEST WILL BE CREDITED cm H20   pH, Ven TEST WILL BE CREDITED 7.250 - 7.300   pCO2, Ven TEST WILL BE CREDITED 45.0 - 50.0 mmHg   pO2, Ven TEST WILL BE CREDITED 30.0 - 45.0 mmHg   Bicarbonate TEST WILL BE CREDITED 20.0 - 24.0 mEq/L   TCO2 TEST WILL BE CREDITED 0 - 100 mmol/L   Acid-Base Excess TEST WILL BE CREDITED 0.0 - 2.0 mmol/L   Acid-base deficit TEST WILL BE CREDITED 0.0 - 2.0 mmol/L   O2 Saturation TEST WILL BE CREDITED %   Patient temperature TEST WILL BE CREDITED    Amplitude TEST WILL BE CREDITED    Map TEST WILL BE CREDITED cmH20   Hertz TEST WILL BE CREDITED    Nitric Oxide TEST WILL BE CREDITED    Collection site TEST WILL BE CREDITED    Drawn by TEST WILL BE CREDITED    Sample type TEST WILL BE CREDITED    Mechanical Rate TEST WILL BE CREDITED   Comprehensive metabolic panel  Result Value Ref Range   Sodium 142 135 -  145 mmol/L   Potassium 4.0 3.5 - 5.1 mmol/L   Chloride 105 101 - 111 mmol/L   CO2 24 22 - 32 mmol/L   Glucose, Bld 98 65 - 99 mg/dL   BUN 9 6 - 20 mg/dL   Creatinine, Ser 1.61 0.44 - 1.00 mg/dL   Calcium 9.4 8.9 - 09.6 mg/dL   Total Protein 7.3 6.5 - 8.1 g/dL   Albumin 3.9 3.5 - 5.0 g/dL   AST 24 15 - 41 U/L   ALT 24 14 - 54 U/L   Alkaline Phosphatase 82 38 - 126 U/L   Total Bilirubin 0.7 0.3 - 1.2 mg/dL  GFR calc non Af Amer >60 >60 mL/min   GFR calc Af Amer >60 >60 mL/min   Anion gap 13 5 - 15  CBC with Differential/Platelet  Result Value Ref Range   WBC 6.7 4.0 - 10.5 K/uL   RBC 4.73 3.87 - 5.11 MIL/uL   Hemoglobin 15.0 12.0 - 15.0 g/dL   HCT 96.0 45.4 - 09.8 %   MCV 92.6 78.0 - 100.0 fL   MCH 31.7 26.0 - 34.0 pg   MCHC 34.2 30.0 - 36.0 g/dL   RDW 11.9 14.7 - 82.9 %   Platelets 185 150 - 400 K/uL   Neutrophils Relative % 53 %   Neutro Abs 3.5 1.7 - 7.7 K/uL   Lymphocytes Relative 35 %   Lymphs Abs 2.4 0.7 - 4.0 K/uL   Monocytes Relative 11 %   Monocytes Absolute 0.7 0.1 - 1.0 K/uL   Eosinophils Relative 1 %   Eosinophils Absolute 0.1 0.0 - 0.7 K/uL   Basophils Relative 0 %   Basophils Absolute 0.0 0.0 - 0.1 K/uL  Urine rapid drug screen (hosp performed)  Result Value Ref Range   Opiates NONE DETECTED NONE DETECTED   Cocaine NONE DETECTED NONE DETECTED   Benzodiazepines POSITIVE (A) NONE DETECTED   Amphetamines NONE DETECTED NONE DETECTED   Tetrahydrocannabinol NONE DETECTED NONE DETECTED   Barbiturates NONE DETECTED NONE DETECTED  CK  Result Value Ref Range   Total CK 439 (H) 38 - 234 U/L  CBG monitoring, ED  Result Value Ref Range   Glucose-Capillary 87 65 - 99 mg/dL  I-Stat venous blood gas, ED  Result Value Ref Range   pH, Ven 7.297 7.250 - 7.300   pCO2, Ven 50.8 (H) 45.0 - 50.0 mmHg   pO2, Ven 28.0 (LL) 30.0 - 45.0 mmHg   Bicarbonate 24.8 (H) 20.0 - 24.0 mEq/L   TCO2 26 0 - 100 mmol/L   O2 Saturation 46.0 %   Acid-base deficit 2.0 0.0 - 2.0 mmol/L    Patient temperature HIDE    Sample type VENOUS    Comment NOTIFIED PHYSICIAN   POC urine preg, ED (not at [facility])  Result Value Ref Range   Preg Test, Ur NEGATIVE NEGATIVE   No results found.  MDM  Pt had sz secondary to missed doses of antiepilectics . Mildly elevated ck d/t mild rhabdomyolysis from sz. Should clearspontaneously,as pt wit normal renal function  Final diagnoses:  None   Plan Rx keppra , vimpat, meclizine (pt requests rx as suffers from chronic vertigo), [facility] to get PMD Dx Seizure disorder     Doug Sou, MD 03/30/15 1755

## 2015-03-30 NOTE — ED Notes (Signed)
Called pharmacy regarding Vimpat, reports they are still mixing it and will send it up ASAP

## 2015-03-30 NOTE — ED Notes (Signed)
Pt now calm and resting comfortably.  Family remains at bedside and vitals remain stable.

## 2015-03-31 MED FILL — VIMPAT 100 MG TABLET: 100 | 14 days supply | Qty: 28 | Fill #0

## 2015-03-31 MED FILL — TRAVEL SICKNESS 25 MG TAB C: 25 | 30 days supply | Qty: 30 | Fill #0

## 2015-03-31 MED FILL — ?LEVETIRACETAM 500 MG TABLE: 500 | 30 days supply | Qty: 180 | Fill #0

## 2015-04-17 ENCOUNTER — Encounter (HOSPITAL_COMMUNITY): Payer: Self-pay | Admitting: Emergency Medicine

## 2015-04-17 ENCOUNTER — Telehealth: Payer: Self-pay | Admitting: *Deleted

## 2015-04-17 ENCOUNTER — Emergency Department (HOSPITAL_COMMUNITY)
Admission: EM | Admit: 2015-04-17 | Discharge: 2015-04-17 | Disposition: A | Discharge status: Home / Self Care | Payer: Medicaid - Out of State | Attending: Emergency Medicine | Admitting: Emergency Medicine

## 2015-04-17 DIAGNOSIS — R200 Anesthesia of skin: Secondary | ICD-10-CM | POA: Diagnosis present

## 2015-04-17 DIAGNOSIS — M79602 Pain in left arm: Secondary | ICD-10-CM | POA: Diagnosis not present

## 2015-04-17 DIAGNOSIS — F17210 Nicotine dependence, cigarettes, uncomplicated: Secondary | ICD-10-CM | POA: Insufficient documentation

## 2015-04-17 MED FILL — levETIRAcetam 500 MG TABS: 500 | 30 days supply | Qty: 180 | Fill #0

## 2015-04-17 NOTE — ED Notes (Signed)
Unable to locate when called for room x3 

## 2015-04-17 NOTE — ED Notes (Signed)
Pt reports left arm pain and numbness for the last 2 weeks. Pt moving all extremities equally. No neuro deficits, alert, oriented 4, VSS.

## 2015-04-20 MED FILL — VIMPAT 100 MG TABLET: 100 | 30 days supply | Qty: 60 | Fill #0

## 2015-05-01 ENCOUNTER — Ambulatory Visit: Payer: Medicaid - Out of State | Admitting: Family Medicine

## 2015-05-13 ENCOUNTER — Ambulatory Visit: Payer: Medicaid - Out of State | Admitting: Family Medicine

## 2015-05-16 ENCOUNTER — Encounter (HOSPITAL_COMMUNITY): Payer: Self-pay | Admitting: Certified Nurse Midwife

## 2015-05-16 ENCOUNTER — Inpatient Hospital Stay (HOSPITAL_COMMUNITY): Payer: Medicaid Other

## 2015-05-16 ENCOUNTER — Inpatient Hospital Stay (HOSPITAL_COMMUNITY)
Admission: AD | Admit: 2015-05-16 | Discharge: 2015-05-16 | Disposition: A | Discharge status: Home / Self Care | Payer: Medicaid Other | Source: Ambulatory Visit | Attending: Obstetrics & Gynecology | Admitting: Obstetrics & Gynecology

## 2015-05-16 DIAGNOSIS — R1030 Lower abdominal pain, unspecified: Secondary | ICD-10-CM | POA: Diagnosis not present

## 2015-05-16 DIAGNOSIS — R102 Pelvic and perineal pain: Secondary | ICD-10-CM | POA: Insufficient documentation

## 2015-05-16 HISTORY — DX: Headache: R51

## 2015-05-16 HISTORY — DX: Headache, unspecified: R51.9

## 2015-05-16 LAB — WET PREP, GENITAL
Sperm: NONE SEEN
TRICH WET PREP: NONE SEEN
YEAST WET PREP: NONE SEEN

## 2015-05-16 LAB — URINALYSIS, ROUTINE W REFLEX MICROSCOPIC
BILIRUBIN URINE: NEGATIVE
Glucose, UA: NEGATIVE mg/dL
KETONES UR: NEGATIVE mg/dL
NITRITE: NEGATIVE
PH: 6.5 (ref 5.0–8.0)
Protein, ur: NEGATIVE mg/dL
Specific Gravity, Urine: 1.02 (ref 1.005–1.030)

## 2015-05-16 LAB — CBC WITH DIFFERENTIAL/PLATELET
BASOS PCT: 0 %
Basophils Absolute: 0 10*3/uL (ref 0.0–0.1)
EOS ABS: 0.1 10*3/uL (ref 0.0–0.7)
Eosinophils Relative: 1 %
HEMATOCRIT: 41.1 % (ref 36.0–46.0)
HEMOGLOBIN: 14 g/dL (ref 12.0–15.0)
Lymphocytes Relative: 44 %
Lymphs Abs: 2.9 10*3/uL (ref 0.7–4.0)
MCH: 31.8 pg (ref 26.0–34.0)
MCHC: 34.1 g/dL (ref 30.0–36.0)
MCV: 93.4 fL (ref 78.0–100.0)
MONOS PCT: 4 %
Monocytes Absolute: 0.3 10*3/uL (ref 0.1–1.0)
NEUTROS ABS: 3.2 10*3/uL (ref 1.7–7.7)
NEUTROS PCT: 51 %
Platelets: 224 10*3/uL (ref 150–400)
RBC: 4.4 MIL/uL (ref 3.87–5.11)
RDW: 13.4 % (ref 11.5–15.5)
WBC: 6.5 10*3/uL (ref 4.0–10.5)

## 2015-05-16 LAB — URINE MICROSCOPIC-ADD ON

## 2015-05-16 LAB — POCT PREGNANCY, URINE: PREG TEST UR: NEGATIVE

## 2015-05-16 MED ORDER — KETOROLAC TROMETHAMINE 60 MG/2ML IM SOLN
60.0000 mg | Freq: Once | INTRAMUSCULAR | Status: AC
  Administered 2015-05-16: 60 mg via INTRAMUSCULAR
  Filled 2015-05-16: qty 2

## 2015-05-16 MED ORDER — NAPROXEN 500 MG PO TABS: 500.0000 mg | ORAL_TABLET | Freq: Two times a day (BID) | ORAL | Status: DC

## 2015-05-16 NOTE — MAU Provider Note (Signed)
CSN: [id number]     Arrival date & time 05/16/15  1351 History   None    Chief Complaint  Patient presents with  . Abdominal Pain    Pt states "iud pain"     (Consider location/radiation/quality/duration/timing/severity/associated sxs/prior Treatment) Patient is a 30 y.o. female presenting with abdominal pain. The history is provided by the patient.  Abdominal Pain The primary symptoms of the illness include abdominal pain. The current episode started more than 2 days ago. The onset of the illness was gradual.  The patient states that she believes she is currently not pregnant. The patient has not had a change in bowel habit.   Zoeie Nation is a 30 y.o. G2P1011 who presents to the MAU with abdominal pain that started about 2 weeks ago. She describes the pain as being in the lower abdomen and cramping. She has an IUD that was placed about a year ago while she was living in [place]. She has not tried to feel the string but is afraid that the IUD may be the cause of her pain. She has a new sex partner that she has had unprotected sex with for the past 2 months. She has a hx of Chlamydia. She denies UTI symptoms.   Past Medical History  Diagnosis Date  . Seizures (HCC)   . Neuropathy (HCC)   . Epilepsy (HCC)   . Neuropathy (HCC)   . Headache    Past Surgical History  Procedure Laterality Date  . Cesarean section  2011  . Ovarian cyst surgery     No family history on file. Social History  Substance Use Topics  . Smoking status: Current Every Day Smoker    Types: Cigarettes  . Smokeless tobacco: Never Used  . Alcohol Use: No   OB History    Gravida Para Term Preterm AB TAB SAB Ectopic Multiple Living   Review of Systems  Gastrointestinal: Positive for abdominal pain.  all other systems negative    Allergies  Pineapple  Home Medications   Prior to Admission medications   Medication Sig Start Date End Date Taking? Authorizing Provider  gabapentin  (NEURONTIN) 300 MG capsule Take 300 mg by mouth 3 (three) times daily.    Yes Historical Provider, MD  Lacosamide (VIMPAT) 100 MG TABS Take 1 tablet (100 mg total) by mouth 2 (two) times daily. 03/26/15  Yes Roxy Horseman, PA-C  levETIRAcetam (KEPPRA) 500 MG tablet Take 3 tablets (1,500 mg total) by mouth 2 (two) times daily. 03/30/15  Yes Doug Sou, MD  meclizine (ANTIVERT) 12.5 MG tablet Take 1 tablet (12.5 mg total) by mouth 3 (three) times daily as needed for dizziness. 03/30/15  Yes Doug Sou, MD  sertraline (ZOLOFT) 100 MG tablet Take 100 mg by mouth daily.   Yes Historical Provider, MD  butalbital-acetaminophen-caffeine (FIORICET) [phone] MG tablet Take 1-2 tablets by mouth every 6 (six) hours as needed for headache. 02/15/15 02/15/16  Fayrene Helper, PA-C  cyclobenzaprine (FLEXERIL) 10 MG tablet Take 10 mg by mouth 3 (three) times daily as needed for muscle spasms.     Historical Provider, MD  meclizine (ANTIVERT) 25 MG tablet Take 0.5 tablets (12.5 mg total) by mouth 2 (two) times daily as needed for dizziness. Patient not taking: Reported on 05/16/2015 03/30/15   Doug Sou, MD   BP 133/74 mmHg  Pulse 92  Temp(Src) 98 F (36.7 C) (Oral)  Resp 20  Ht  5\' 2"  (1.575 m)  Wt 234 lb (106.142 kg)  BMI 42.79 kg/m2  LMP 05/06/2015 Physical Exam  Constitutional: She is oriented to person, place, and time. No distress.  obese  HENT:  Head: Normocephalic.  Eyes: EOM are normal.  Neck: Neck supple.  Cardiovascular: Normal rate.   Pulmonary/Chest: Effort normal.  Abdominal: Soft. There is tenderness in the suprapubic area and left lower quadrant. There is no rebound, no guarding and no CVA tenderness.  Genitourinary:  External genitalia without lesions, IUD string visible from cervix. Mild CMT, left adnexal tenderness, unable to palpate uterus due to patient habitus.   Musculoskeletal: Normal range of motion.  Neurological: She is alert and oriented to person, place, and time. No  cranial nerve deficit.  Skin: Skin is warm and dry.  Psychiatric: She has a normal mood and affect. Her behavior is normal.  Nursing note and vitals reviewed.   ED Course  Procedures (including critical care time) Labs Review Labs Reviewed  WET PREP, GENITAL - Abnormal; Notable for the following:    Clue Cells Wet Prep HPF POC PRESENT (*)    WBC, Wet Prep HPF POC FEW (*)    All other components within normal limits  URINALYSIS, ROUTINE W REFLEX MICROSCOPIC (NOT AT [facility]) - Abnormal; Notable for the following:    Hgb urine dipstick MODERATE (*)    Leukocytes, UA SMALL (*)    All other components within normal limits  URINE MICROSCOPIC-ADD ON - Abnormal; Notable for the following:    Squamous Epithelial / LPF 0-5 (*)    Bacteria, UA FEW (*)    All other components within normal limits  CBC WITH DIFFERENTIAL/PLATELET  HIV ANTIBODY (ROUTINE TESTING)  RPR  POCT PREGNANCY, URINE  GC/CHLAMYDIA PROBE AMP ([place]) NOT AT [facility]    Imaging Review [place] Transvaginal Non-ob  05/16/2015  CLINICAL DATA:  Pelvic pain EXAM: TRANSABDOMINAL AND TRANSVAGINAL ULTRASOUND OF PELVIS TECHNIQUE: Both transabdominal and transvaginal ultrasound examinations of the pelvis were performed. Transabdominal technique was performed for global imaging of the pelvis including uterus, ovaries, adnexal regions, and pelvic cul-de-sac. It was necessary to proceed with endovaginal exam following the transabdominal exam to visualize the ovaries. COMPARISON:  None FINDINGS: Uterus Measurements: 8.6 x 4.6 x 4.5 cm. No fibroids or other mass visualized. Endometrium IUD is noted in place. Minimal fluid is noted in the cervical canal. Right ovary Measurements: 3.0 x 3.8 x 2.3 cm. Scattered follicles are noted. The dominant follicle measures 1.4 cm. Left ovary Measurements: 3.6 x 2.1 x 3.2 cm. Within normal limits. Other findings No abnormal free fluid. IMPRESSION: IUD in place. Mild follicular changes.  No acute abnormality noted.  Electronically Signed   By: Alcide CleverMark  Lukens M.D.   On: 05/16/2015 15:54   [place] Pelvis Complete  05/16/2015  CLINICAL DATA:  Pelvic pain EXAM: TRANSABDOMINAL AND TRANSVAGINAL ULTRASOUND OF PELVIS TECHNIQUE: Both transabdominal and transvaginal ultrasound examinations of the pelvis were performed. Transabdominal technique was performed for global imaging of the pelvis including uterus, ovaries, adnexal regions, and pelvic cul-de-sac. It was necessary to proceed with endovaginal exam following the transabdominal exam to visualize the ovaries. COMPARISON:  None FINDINGS: Uterus Measurements: 8.6 x 4.6 x 4.5 cm. No fibroids or other mass visualized. Endometrium IUD is noted in place. Minimal fluid is noted in the cervical canal. Right ovary Measurements: 3.0 x 3.8 x 2.3 cm. Scattered follicles are noted. The dominant follicle measures 1.4 cm. Left ovary Measurements: 3.6 x 2.1 x 3.2 cm. Within normal  limits. Other findings No abnormal free fluid. IMPRESSION: IUD in place. Mild follicular changes.  No acute abnormality noted. Electronically Signed   By: Alcide Clever M.D.   On: 05/16/2015 15:54   I have personally reviewed and evaluated these images and lab results as part of my medical decision-making.   MDM  30 y.o. female with pelvic pain stable for d/c with IUD in correct position and no torsion, abscess or other findings on exam or ultrasound that would indicate any immediate intervention. Will treat for pelvic pain with NSAIDS and have patient follow up with the GYN Clinic or GYN of choice. Discussed with the patient and all questioned fully answered. She will return if any problems arise. Rx for Naprosyn.   Final diagnoses:  Lower abdominal pain

## 2015-05-16 NOTE — Discharge Instructions (Signed)
Your ultrasound today shows that the IUD in in correct position. There is no abscess or other findings that would suggest pain. Follow up with the GYN Clinic or the GYN of choice for your pap smear and routine care. Return here as needed.

## 2015-05-16 NOTE — MAU Note (Signed)
Got an IUD almost a year ago. 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 Pain in abdomen now and was unable to finish shift at work.

## 2015-05-16 NOTE — MAU Note (Signed)
Patient c/o increased pain requested order for pain medicatino.

## 2015-05-17 LAB — RPR: RPR Ser Ql: NONREACTIVE

## 2015-05-17 LAB — HIV ANTIBODY (ROUTINE TESTING W REFLEX): HIV SCREEN 4TH GENERATION: NONREACTIVE

## 2015-05-18 LAB — GC/CHLAMYDIA PROBE AMP (~~LOC~~) NOT AT ARMC
CHLAMYDIA, DNA PROBE: NEGATIVE
NEISSERIA GONORRHEA: NEGATIVE

## 2015-05-25 MED FILL — VIMPAT 100 MG TABLET: 100 | 30 days supply | Qty: 60 | Fill #1

## 2015-05-25 MED FILL — levETIRAcetam 500 MG TABS: 500 | 30 days supply | Qty: 180 | Fill #1

## 2015-05-26 ENCOUNTER — Emergency Department (HOSPITAL_COMMUNITY)
Admission: EM | Admit: 2015-05-26 | Discharge: 2015-05-26 | Disposition: A | Discharge status: Home / Self Care | Payer: Medicaid Other | Attending: Emergency Medicine | Admitting: Emergency Medicine

## 2015-05-26 ENCOUNTER — Encounter (HOSPITAL_COMMUNITY): Payer: Self-pay | Admitting: Family Medicine

## 2015-05-26 DIAGNOSIS — F17210 Nicotine dependence, cigarettes, uncomplicated: Secondary | ICD-10-CM | POA: Diagnosis not present

## 2015-05-26 DIAGNOSIS — Z79899 Other long term (current) drug therapy: Secondary | ICD-10-CM | POA: Insufficient documentation

## 2015-05-26 DIAGNOSIS — R569 Unspecified convulsions: Secondary | ICD-10-CM | POA: Diagnosis present

## 2015-05-26 DIAGNOSIS — G40909 Epilepsy, unspecified, not intractable, without status epilepticus: Secondary | ICD-10-CM | POA: Diagnosis not present

## 2015-05-26 LAB — CBC WITH DIFFERENTIAL/PLATELET
BASOS ABS: 0 10*3/uL (ref 0.0–0.1)
BASOS PCT: 0 %
EOS ABS: 0.1 10*3/uL (ref 0.0–0.7)
EOS PCT: 2 %
HCT: 42.8 % (ref 36.0–46.0)
Hemoglobin: 14 g/dL (ref 12.0–15.0)
Lymphocytes Relative: 39 %
Lymphs Abs: 2.9 10*3/uL (ref 0.7–4.0)
MCH: 30.5 pg (ref 26.0–34.0)
MCHC: 32.7 g/dL (ref 30.0–36.0)
MCV: 93.2 fL (ref 78.0–100.0)
MONO ABS: 0.5 10*3/uL (ref 0.1–1.0)
Monocytes Relative: 7 %
Neutro Abs: 3.9 10*3/uL (ref 1.7–7.7)
Neutrophils Relative %: 52 %
PLATELETS: 191 10*3/uL (ref 150–400)
RBC: 4.59 MIL/uL (ref 3.87–5.11)
RDW: 13 % (ref 11.5–15.5)
WBC: 7.5 10*3/uL (ref 4.0–10.5)

## 2015-05-26 LAB — BASIC METABOLIC PANEL
ANION GAP: 9 (ref 5–15)
BUN: 12 mg/dL (ref 6–20)
CALCIUM: 8.8 mg/dL — AB (ref 8.9–10.3)
CO2: 22 mmol/L (ref 22–32)
Chloride: 109 mmol/L (ref 101–111)
Creatinine, Ser: 0.81 mg/dL (ref 0.44–1.00)
GFR calc Af Amer: >60 mL/min (ref >60)
GLUCOSE: 98 mg/dL (ref 65–99)
POTASSIUM: 3.8 mmol/L (ref 3.5–5.1)
SODIUM: 140 mmol/L (ref 135–145)

## 2015-05-26 MED ORDER — LORAZEPAM 2 MG/ML IJ SOLN
1.0000 mg | Freq: Once | INTRAMUSCULAR | Status: AC
  Administered 2015-05-26: 1 mg via INTRAVENOUS
  Filled 2015-05-26: qty 1

## 2015-05-26 MED ORDER — ACETAMINOPHEN 500 MG PO TABS: 500.0000 mg | ORAL_TABLET | Freq: Four times a day (QID) | ORAL | Status: DC | PRN

## 2015-05-26 MED ORDER — GABAPENTIN 300 MG PO CAPS: 300.0000 mg | ORAL_CAPSULE | Freq: Three times a day (TID) | ORAL | Status: DC

## 2015-05-26 MED ORDER — LEVETIRACETAM 500 MG PO TABS
500.0000 mg | ORAL_TABLET | Freq: Once | ORAL | Status: AC
  Administered 2015-05-26: 500 mg via ORAL
  Filled 2015-05-26: qty 1

## 2015-05-26 MED ORDER — SODIUM CHLORIDE 0.9 % IV SOLN
1000.0000 mg | Freq: Once | INTRAVENOUS | Status: AC
  Administered 2015-05-26: 1000 mg via INTRAVENOUS
  Filled 2015-05-26: qty 10

## 2015-05-26 MED ORDER — LACOSAMIDE 100 MG PO TABS: 100.0000 mg | ORAL_TABLET | Freq: Two times a day (BID) | ORAL | Status: DC

## 2015-05-26 MED ORDER — GABAPENTIN 300 MG PO CAPS
300.0000 mg | ORAL_CAPSULE | Freq: Once | ORAL | Status: AC
  Administered 2015-05-26: 300 mg via ORAL
  Filled 2015-05-26: qty 1

## 2015-05-26 MED ORDER — ACETAMINOPHEN 325 MG PO TABS
650.0000 mg | ORAL_TABLET | Freq: Once | ORAL | Status: AC
  Administered 2015-05-26: 650 mg via ORAL
  Filled 2015-05-26: qty 2

## 2015-05-26 MED ORDER — LEVETIRACETAM 500 MG PO TABS: 1500.0000 mg | ORAL_TABLET | Freq: Two times a day (BID) | ORAL | Status: DC

## 2015-05-26 MED ORDER — SODIUM CHLORIDE 0.9 % IV SOLN
100.0000 mg | Freq: Two times a day (BID) | INTRAVENOUS | Status: DC
  Administered 2015-05-26: 100 mg via INTRAVENOUS
  Filled 2015-05-26 (×2): qty 10

## 2015-05-26 NOTE — ED Notes (Signed)
PA Will at the bedside   

## 2015-05-26 NOTE — ED Notes (Signed)
Pt here for seizure like activity that started this am. sts she hasnt been taking her seizure meds since Saturday. Pt having jerking movements. Responding and following commands.

## 2015-05-26 NOTE — Discharge Instructions (Signed)

## 2015-05-26 NOTE — ED Provider Notes (Signed)
CSN: [id number]     Arrival date & time 05/26/15  0757 History   First MD Initiated Contact with Patient 05/26/15 [phone]     Chief Complaint  Patient presents with  . Seizures   Level V Caveat: Seizure   Angela Doyle is a 30 y.o. female with a history of seizures who presents to the ED with a seizure today. The patient's mother is at bedside. She reports that the patient started having a seizure today. The patient has recently moved to the area from [place]. She was seen in the emergency department approximately 2 months ago and is provided with 2 months worth of her seizure medication. The patient's mother reports that she ran out of her Vimpat and gabapentin 3 days ago. She also reports she is out of her Keppra beginning today. Her last dose of Keppra was last night. Patient is been having jerking like movements to her mouth and right arm. No fevers, no vomiting, no recent illness.    Patient is a 30 y.o. female presenting with seizures. The history is provided by a parent. The history is limited by the condition of the patient.  Seizures   Past Medical History  Diagnosis Date  . Seizures (HCC)   . Neuropathy (HCC)   . Epilepsy (HCC)   . Neuropathy (HCC)   . Headache    Past Surgical History  Procedure Laterality Date  . Cesarean section  2011  . Ovarian cyst surgery     History reviewed. No pertinent family history. Social History  Substance Use Topics  . Smoking status: Current Every Day Smoker    Types: Cigarettes  . Smokeless tobacco: Never Used  . Alcohol Use: No   OB History    Gravida Para Term Preterm AB TAB SAB Ectopic Multiple Living   Review of Systems  Unable to perform ROS: Other (seizure)  Constitutional: Negative for fever.  Skin: Negative for wound.  Neurological: Positive for seizures. Negative for syncope.      Allergies  Pineapple  Home Medications   Prior to Admission medications   Medication Sig Start Date End Date  Taking? Authorizing Provider  butalbital-acetaminophen-caffeine (FIORICET) [id number] MG tablet Take 1-2 tablets by mouth every 6 (six) hours as needed for headache. 02/15/15 02/15/16 Yes Fayrene Helper, PA-C  cyclobenzaprine (FLEXERIL) 10 MG tablet Take 10 mg by mouth 3 (three) times daily as needed for muscle spasms.    Yes Historical Provider, MD  meclizine (ANTIVERT) 12.5 MG tablet Take 1 tablet (12.5 mg total) by mouth 3 (three) times daily as needed for dizziness. 03/30/15  Yes Doug Sou, MD  naproxen (NAPROSYN) 500 MG tablet Take 1 tablet (500 mg total) by mouth 2 (two) times daily. 05/16/15  Yes Hope Orlene Och, NP  sertraline (ZOLOFT) 100 MG tablet Take 100 mg by mouth daily.   Yes Historical Provider, MD  gabapentin (NEURONTIN) 300 MG capsule Take 1 capsule (300 mg total) by mouth 3 (three) times daily. 05/26/15   Everlene Farrier, PA-C  Lacosamide (VIMPAT) 100 MG TABS Take 1 tablet (100 mg total) by mouth 2 (two) times daily. 05/26/15   Everlene Farrier, PA-C  levETIRAcetam (KEPPRA) 500 MG tablet Take 3 tablets (1,500 mg total) by mouth 2 (two) times daily. 05/26/15   Everlene Farrier, PA-C   BP 103/64 mmHg  Pulse 97  Temp(Src) 98.6 F (37 C) (Oral)  Resp 18  SpO2 97%  LMP 05/06/2015 Physical Exam  Constitutional: She appears well-developed and well-nourished. No distress.  Nontoxic appearing. Patient will open eyes to verbal command.  HENT:  Head: Normocephalic and atraumatic.  Mouth/Throat: Oropharynx is clear and moist.  Airway is intact.   Eyes: Conjunctivae are normal. Pupils are equal, round, and reactive to light. Right eye exhibits no discharge. Left eye exhibits no discharge.  Neck: Neck supple.  Cardiovascular: Normal rate, regular rhythm, normal heart sounds and intact distal pulses.  Exam reveals no gallop and no friction rub.   No murmur heard. Pulmonary/Chest: Effort normal and breath sounds normal. No respiratory distress. She has no wheezes. She has no rales.  Abdominal:  Soft. There is no tenderness.  Musculoskeletal: She exhibits no edema.  Lymphadenopathy:    She has no cervical adenopathy.  Neurological: She is alert.  Patient will open eyes to verbal command and she is following simple commands. She is having jerking movement of her mouth and right arm. She is not answering my questions. Airway is intact.   Skin: Skin is warm and dry. No rash noted. She is not diaphoretic. No erythema. No pallor.  Nursing note and vitals reviewed.   ED Course  Procedures (including critical care time) Labs Review Labs Reviewed  BASIC METABOLIC PANEL - Abnormal; Notable for the following:    Calcium 8.8 (*)    All other components within normal limits  CBC WITH DIFFERENTIAL/PLATELET    Imaging Review No results found.    EKG Interpretation   Date/Time:  Tuesday May 26 2015 10:00:49 EDT Ventricular Rate:  75 PR Interval:  155 QRS Duration: 81 QT Interval:  390 QTC Calculation: 436 R Axis:   50 Text Interpretation:  Sinus rhythm Borderline low voltage, extremity leads  Confirmed by [facility] MD, Barbara CowerJASON [phone]([id number]) on 05/26/2015 11:23:04 AM Also  confirmed by [facility], JASON [phone]([id number])  on 05/26/2015 11:23:18 AM      Filed Vitals:   05/26/15 1015 05/26/15 1045 05/26/15 1115 05/26/15 1130  BP: 118/61 111/75 116/68 103/64  Pulse: 77 79 81 97  Temp:      TempSrc:      Resp: 22 22 21 18   SpO2: 97% 98% 98% 97%     MDM   Meds given in ED:  Medications  lacosamide (VIMPAT) 100 mg in sodium chloride 0.9 % 25 mL IVPB (100 mg Intravenous Given 05/26/15 1018)  acetaminophen (TYLENOL) tablet 650 mg (not administered)  levETIRAcetam (KEPPRA) tablet 500 mg (not administered)  levETIRAcetam (KEPPRA) 1,000 mg in sodium chloride 0.9 % 100 mL IVPB (0 mg Intravenous Stopped 05/26/15 0956)  gabapentin (NEURONTIN) capsule 300 mg (300 mg Oral Given 05/26/15 1026)  LORazepam (ATIVAN) injection 1 mg (1 mg Intravenous Given 05/26/15 1027)    New Prescriptions   No medications  on file    Final diagnoses:  Seizure (HCC)   This is a 30 y.o. female with a history of seizures who presents to the ED with a seizure today. The patient's mother is at bedside. She reports that the patient started having a seizure today. [place] She was seen in the emergency department approximately 2 months ago and is provided with 2 months worth of her seizure medication. The patient's mother reports that she ran out of her Vimpat and gabapentin 3 days ago. She also reports she is out of her Keppra beginning today. Her last dose of Keppra was last night. Patient is been having jerking  like movements to her mouth and right arm. On exam the patient is afebrile nontoxic appearing. The patient is having a seizure on arrival with jerking movements of her mouth and right arm. She is following commands. She is not answering questions verbally. We'll provide with home meds of Keppra, Vimpat and gabapentin. After patient received a gram of Keppra her seizure has stopped. She still waiting Vimpat and gabapentin. She is alert and oriented. She complains of some chest pain and generalized body pain. She reports this is typical after her seizure. Patient's chest pain is reproducible on palpation. EKG shows normal sinus rhythm. At third reevaluation patient has had Vimpat, Keppra and gabapentin. She has not had any further seizures. She is alert and oriented 3. She has no focal neurological deficits. She is spontaneously moving all of her extremities without difficulty. She reports feeling ready for discharge. She does complain of some generalized body ache after her seizure.  Patient has follow-up with primary care in 2 days. Will provide with prescriptions for Vimpat, keppra and gabapentin. I encouraged close follow-up by primary care and discussed strict and specific return precautions. I advised the patient to follow-up with their primary care provider this week.  I advised the patient to return to the emergency department with new or worsening symptoms or new concerns. The patient verbalized understanding and agreement with plan.    This patient was discussed with Dr. Clayborne Dana who agrees with assessment and plan.   Everlene Farrier, PA-C 05/26/15 1148  Marily Memos, MD 05/27/15 1050

## 2015-05-28 ENCOUNTER — Other Ambulatory Visit: Payer: Self-pay | Admitting: Family Medicine

## 2015-05-28 ENCOUNTER — Encounter: Payer: Self-pay | Admitting: Family Medicine

## 2015-05-28 ENCOUNTER — Ambulatory Visit (INDEPENDENT_AMBULATORY_CARE_PROVIDER_SITE_OTHER): Payer: Medicaid Other | Admitting: Family Medicine

## 2015-05-28 VITALS — BP 125/84 | HR 106 | Temp 98.1°F | Resp 16 | Ht 62.0 in | Wt 241.0 lb

## 2015-05-28 DIAGNOSIS — G40909 Epilepsy, unspecified, not intractable, without status epilepticus: Secondary | ICD-10-CM | POA: Diagnosis not present

## 2015-05-28 DIAGNOSIS — E8881 Metabolic syndrome: Secondary | ICD-10-CM

## 2015-05-28 DIAGNOSIS — G629 Polyneuropathy, unspecified: Secondary | ICD-10-CM

## 2015-05-28 DIAGNOSIS — G40901 Epilepsy, unspecified, not intractable, with status epilepticus: Secondary | ICD-10-CM | POA: Insufficient documentation

## 2015-05-28 DIAGNOSIS — R8290 Unspecified abnormal findings in urine: Secondary | ICD-10-CM

## 2015-05-28 DIAGNOSIS — Z23 Encounter for immunization: Secondary | ICD-10-CM

## 2015-05-28 DIAGNOSIS — F17200 Nicotine dependence, unspecified, uncomplicated: Secondary | ICD-10-CM | POA: Diagnosis not present

## 2015-05-28 DIAGNOSIS — F32A Depression, unspecified: Secondary | ICD-10-CM

## 2015-05-28 DIAGNOSIS — G4733 Obstructive sleep apnea (adult) (pediatric): Secondary | ICD-10-CM

## 2015-05-28 DIAGNOSIS — R600 Localized edema: Secondary | ICD-10-CM

## 2015-05-28 DIAGNOSIS — F329 Major depressive disorder, single episode, unspecified: Secondary | ICD-10-CM

## 2015-05-28 LAB — POCT URINALYSIS DIP (DEVICE)
BILIRUBIN URINE: NEGATIVE
Glucose, UA: NEGATIVE mg/dL
Ketones, ur: NEGATIVE mg/dL
NITRITE: NEGATIVE
PH: 6.5 (ref 5.0–8.0)
Protein, ur: NEGATIVE mg/dL
Specific Gravity, Urine: 1.025 (ref 1.005–1.030)
UROBILINOGEN UA: 0.2 mg/dL (ref 0.0–1.0)

## 2015-05-28 LAB — LIPID PANEL
CHOLESTEROL: 183 mg/dL (ref 125–200)
HDL: 44 mg/dL — ABNORMAL LOW (ref >=46)
LDL CALC: 118 mg/dL (ref <130)
Total CHOL/HDL Ratio: 4.2 Ratio (ref <=5.0)
Triglycerides: 104 mg/dL (ref <150)
VLDL: 21 mg/dL (ref <30)

## 2015-05-28 LAB — TSH: TSH: 1.93 mIU/L

## 2015-05-28 MED ORDER — SERTRALINE HCL 100 MG PO TABS: 100.0000 mg | ORAL_TABLET | Freq: Every day | ORAL | Status: DC

## 2015-05-28 MED ORDER — GABAPENTIN 300 MG PO CAPS: 300.0000 mg | ORAL_CAPSULE | Freq: Three times a day (TID) | ORAL | Status: DC

## 2015-05-28 MED ORDER — LACOSAMIDE 100 MG PO TABS: 100.0000 mg | ORAL_TABLET | Freq: Two times a day (BID) | ORAL | Status: DC

## 2015-05-28 MED ORDER — FUROSEMIDE 20 MG PO TABS: 20.0000 mg | ORAL_TABLET | Freq: Every day | ORAL | Status: DC

## 2015-05-28 MED ORDER — LEVETIRACETAM 500 MG PO TABS: 1500.0000 mg | ORAL_TABLET | Freq: Two times a day (BID) | ORAL | Status: DC

## 2015-05-28 NOTE — Progress Notes (Signed)
Subjective:    Patient ID: Angela Doyle, female    DOB: 10-28-1985, 28 y.o.   MRN: [id number]  HPI Ms. Angela Doyle, a 30 year old female with a history of seizure disorder and neuropathy presents to establish care. Patient says that she relocated from [place] several months ago and was out of medications until emergency room evaluation on 05/26/2015. She maintains that she has been taking medications consistently since that time. She states that she was a patient of Dr. Phill Mutter at [facility] [place] [facility] in [place], [place]. She says that she was under the care of neurology while living in [place]. Patient's aunt reports that her most recent seizure was 2 days ago.  Patient had been out of medications for several days. She states that she has been taking medications consistently since going to the emergency department.    Patient is also complaining of migraine headaches. Symptoms began several years ago. Generally, the headaches last several hours per day and occur intermittently. The headache is not specific to any time of day.  The headaches are usually dull and poorly described and are bilateral in location.  The patient rates her most severe headaches a 6 on a scale from 1 to 10. Recently, the headaches are stable. Precipitating factors have not been determined.  The headaches are usually not preceded by an aura. Associated neurologic symptoms which are present include: depression and numbness of extremities. The patient denies loss of balance, muscle weakness and vision problems.  Other history includes: migraine headaches diagnosed in the past.  Patient complains of swelling to lower extremities for greater than 1 week. She states that it has been difficult to apply shoes. She does not have a history of high blood pressure, but she does endorse a family history of heart disease. The location of the edema is to bilateral lower extremities.   The edema has been moderate.    The swelling has  been aggravated by dependency of involved area and increased salt intake, relieved by elevation of involved area. Cardiac risk factors include obesity (BMI >= 30 kg/m2), sedentary lifestyle and smoking/ tobacco exposure.   She describes symptoms of neuropathy. She states that she has had neuropathy for several years that has been controlled on gabapentin. The patient denies fatigue, recent falls, weakness.   Patient also reports a history of depression. complains of depression. She states that the onset was several years ago and it was primarily situational initially. She reports occasional symptoms of anhedonia, depressed mood and hopelessness.   She denies current suicidal and homicidal plan or intent.  She states that symptoms are controlled on Zoloft.   Past Medical History  Diagnosis Date  . Seizures (HCC)   . Neuropathy (HCC)   . Epilepsy (HCC)   . Neuropathy (HCC)   . Headache    There is no immunization history on file for this patient.  Allergies  Allergen Reactions  . Pineapple Swelling    Oral swelling   Past Surgical History  Procedure Laterality Date  . Cesarean section  2011  . Ovarian cyst surgery     Social History   Social History  . Marital Status: Single    Spouse Name: N/A  . Number of Children: N/A  . Years of Education: N/A   Occupational History  . Not on file.   Social History Main Topics  . Smoking status: Current Every Day Smoker    Types: Cigarettes  . Smokeless tobacco: Never Used  .  Alcohol Use: No  . Drug Use: No  . Sexual Activity: Yes   Other Topics Concern  . Not on file   Social History Narrative   Review of Systems  Constitutional: Positive for unexpected weight change (weight gain). Negative for fever and fatigue.  HENT: Negative for congestion.   Eyes: Positive for photophobia. Negative for visual disturbance.  Respiratory: Negative.  Negative for chest tightness, shortness of breath and wheezing.   Cardiovascular: Positive  for leg swelling. Negative for chest pain and palpitations.  Gastrointestinal: Negative.   Endocrine: Negative.  Negative for cold intolerance, heat intolerance, polydipsia, polyphagia and polyuria.  Genitourinary: Negative.   Musculoskeletal: Negative.   Skin: Negative.   Allergic/Immunologic: Negative.   Neurological: Positive for numbness and headaches.  Hematological: Negative.   Psychiatric/Behavioral: Negative for suicidal ideas and sleep disturbance.       History of depression       Objective:   Physical Exam  Constitutional: She is oriented to person, place, and time. She appears well-developed and well-nourished.  Morbid obesity  HENT:  Head: Normocephalic and atraumatic.  Right Ear: External ear normal.  Left Ear: External ear normal.  Mouth/Throat: Oropharynx is clear and moist.  Eyes: Conjunctivae and EOM are normal. Pupils are equal, round, and reactive to light.  Neck: Normal range of motion. Neck supple.  Cardiovascular: Normal rate, regular rhythm, normal heart sounds and intact distal pulses.   Pulses:      Carotid pulses are 2+ on the right side, and 2+ on the left side.      Radial pulses are 2+ on the right side, and 2+ on the left side.       Femoral pulses are 2+ on the right side, and 2+ on the left side.      Popliteal pulses are 2+ on the right side, and 2+ on the left side.       Dorsalis pedis pulses are 2+ on the right side, and 2+ on the left side.       Posterior tibial pulses are 2+ on the right side, and 2+ on the left side.  2 + pitting edema to lower extremities  Pulmonary/Chest: Effort normal and breath sounds normal.  Abdominal: Soft. Bowel sounds are normal.  Musculoskeletal: Normal range of motion.  Neurological: She is alert and oriented to person, place, and time. She has normal reflexes.  Skin: Skin is warm and dry.  Psychiatric: She has a normal mood and affect. Her behavior is normal. Judgment and thought content normal.       BP  125/84 mmHg  Pulse 106  Temp(Src) 98.1 F (36.7 C) (Oral)  Resp 16  Ht  (1.575 Doyle)  Wt 241 lb (109.317 kg)  BMI 44.07 kg/m2  LMP 05/06/2015 Assessment & Plan:  1. Nonintractable epilepsy with status epilepticus, unspecified epilepsy type (HCC) Will continue medications as previously prescribed. I will also review medical records from [place] as they come available. Will also forward notes to neurology. I will defer to neurology for further evaluation.   - Ambulatory referral to Neurology  2. Seizure disorder (HCC) There have been some compliance issues here. I have discussed with her the great importance of following the treatment plan exactly as directed in order to achieve a good medical outcome. - levETIRAcetam (KEPPRA) 500 MG tablet; Take 3 tablets (1,500 mg total) by mouth 2 (two) times daily.  Dispense: 180 tablet; Refill: 3 - Lacosamide (VIMPAT) 100 MG TABS; Take 1 tablet (  100 mg total) by mouth 2 (two) times daily.  Dispense: 60 tablet; Refill: 2  3. Neuropathy (HCC) - gabapentin (NEURONTIN) 300 MG capsule; Take 1 capsule (300 mg total) by mouth 3 (three) times daily.  Dispense: 90 capsule; Refill: 3  4. Depression She maintains that depression is controlled on Zoloft. Will continue medication.  - sertraline (ZOLOFT) 100 MG tablet; Take 1 tablet (100 mg total) by mouth daily.  Dispense: 30 tablet; Refill: 2  5. Obstructive sleep apnea  - Split night study; Future  6. Metabolic syndrome Current BMI is 44.07 and patient has excessive weight around waist. Recommend a lowfat, low carbohydrate diet divided over 5-6 small meals, increase water intake to 6-8 glasses, and 150 minutes per week of cardiovascular exercise.   - Hemoglobin A1c - TSH - Lipid Panel  7. Edema of both legs I will start a trial of Lasix 20 mg daily. Elevate lower extremities to heart level while at rest.  - furosemide (LASIX) 20 MG tablet; Take 1 tablet (20 mg total) by mouth daily.  Dispense: 30  tablet; Refill: 0  8. Immunization due - Pneumococcal polysaccharide vaccine 23-valent greater than or equal to 2yo subcutaneous/IM  9. Tobacco dependence Smoking cessation instruction/counseling given:  counseled patient on the dangers of tobacco use, advised patient to stop smoking, and reviewed strategies to maximize success  10. Abnormal urinalysis Urine leukocytes present, will add on urine culture.  - Urine culture  Requested medical records. Will review as they become available   RTC: Will schedule follow up appointment after reviewing labs.       Orian Amberg M, FNP  The patient was given clear instructions to go to ER or return to medical center if symptoms do not improve, worsen or new problems develop. The patient verbalized understanding. Will notify patient with laboratory results.

## 2015-05-28 NOTE — Patient Instructions (Addendum)
Epilepsy Epilepsy is a disorder in which a person has repeated seizures over time. A seizure is a release of abnormal electrical activity in the brain. Seizures can cause a change in attention, behavior, or the ability to remain awake and alert (altered mental status). Seizures often involve uncontrollable shaking (convulsions).  Most people with epilepsy lead normal lives. However, people with epilepsy are at an increased risk of falls, accidents, and injuries. Therefore, it is important to begin treatment right away. CAUSES  Epilepsy has many possible causes. Anything that disturbs the normal pattern of brain cell activity can lead to seizures. This may include:   Head injury.  Birth trauma.  High fever as a child.  Stroke.  Bleeding into or around the brain.  Certain drugs.  Prolonged low oxygen, such as what occurs after CPR efforts.  Abnormal brain development.  Certain illnesses, such as meningitis, encephalitis (brain infection), malaria, and other infections.  An imbalance of nerve signaling chemicals (neurotransmitters).  SIGNS AND SYMPTOMS  The symptoms of a seizure can vary greatly from one person to another. Right before a seizure, you may have a warning (aura) that a seizure is about to occur. An aura may include the following symptoms:  Fear or anxiety.  Nausea.  Feeling like the room is spinning (vertigo).  Vision changes, such as seeing flashing lights or spots. Common symptoms during a seizure include:  Abnormal sensations, such as an abnormal smell or a bitter taste in the mouth.   Sudden, general body stiffness.   Convulsions that involve rhythmic jerking of the face, arm, or leg on one or both sides.   Sudden change in consciousness.   Appearing to be awake but not responding.   Appearing to be asleep but cannot be awakened.   Grimacing, chewing, lip smacking, drooling, tongue biting, or loss of bowel or bladder control. After a seizure,  you may feel sleepy for a while. DIAGNOSIS  Your health care provider will ask about your symptoms and take a medical history. Descriptions from any witnesses to your seizures will be very helpful in the diagnosis. A physical exam, including a detailed neurological exam, is necessary. Various tests may be done, such as:   An electroencephalogram (EEG). This is a painless test of your brain waves. In this test, a diagram is created of your brain waves. These diagrams can be interpreted by a specialist.  An MRI of the brain.   A CT scan of the brain.   A spinal tap (lumbar puncture, LP).  Blood tests to check for signs of infection or abnormal blood chemistry. TREATMENT  There is no cure for epilepsy, but it is generally treatable. Once epilepsy is diagnosed, it is important to begin treatment as soon as possible. For most people with epilepsy, seizures can be controlled with medicines. The following may also be used:  A pacemaker for the brain (vagus nerve stimulator) can be used for people with seizures that are not well controlled by medicine.  Surgery on the brain. For some people, epilepsy eventually goes away. HOME CARE INSTRUCTIONS   Follow your health care provider's recommendations on driving and safety in normal activities.  Get enough rest. Lack of sleep can cause seizures.  Only take over-the-counter or prescription medicines as directed by your health care provider. Take any prescribed medicine exactly as directed.  Avoid any known triggers of your seizures.  Keep a seizure diary. Record what you recall about any seizure, especially any possible trigger.   Make   sure the people you live and work with know that you are prone to seizures. They should receive instructions on how to help you. In general, a witness to a seizure should:   Cushion your head and body.   Turn you on your side.   Avoid unnecessarily restraining you.   Not place anything inside your  mouth.   Call for emergency medical help if there is any question about what has occurred.   Follow up with your health care provider as directed. You may need regular blood tests to monitor the levels of your medicine.  SEEK MEDICAL CARE IF:   You develop signs of infection or other illness. This might increase the risk of a seizure.   You seem to be having more frequent seizures.   Your seizure pattern is changing.  SEEK IMMEDIATE MEDICAL CARE IF:   You have a seizure that does not stop after a few moments.   You have a seizure that causes any difficulty in breathing.   You have a seizure that results in a very severe headache.   You have a seizure that leaves you with the inability to speak or use a part of your body.    This information is not intended to replace advice given to you by your health care provider. Make sure you discuss any questions you have with your health care provider.   Document Released: 02/14/2005 Document Revised: 12/05/2012 Document Reviewed: 09/26/2012 Elsevier Interactive Patient Education 2016 Else[employer] Inc[employer] Peripheral Neuropathy Peripheral neuropathy is a type of nerve damage. It affects nerves that carry signals between the spinal cord and other parts of the body. These are called peripheral nerves. With peripheral neuropathy, one nerve or a group of nerves may be damaged.  CAUSES  Many things can damage peripheral nerves. For some people with peripheral neuropathy, the cause is unknown. Some causes include:  Diabetes. This is the most common cause of peripheral neuropathy.  Injury to a nerve.  Pressure or stress on a nerve that lasts a long time.  Too little vitamin B. Alcoholism can lead to this.  Infections.  Autoimmune diseases, such as multiple sclerosis and systemic lupus erythematosus.  Inherited nerve diseases.  Some medicines, such as cancer drugs.  Toxic substances, such as lead and mercury.  Too little blood  flowing to the legs.  Kidney disease.  Thyroid disease. SIGNS AND SYMPTOMS  Different people have different symptoms. The symptoms you have will depend on which of your nerves is damaged. Common symptoms include:  Loss of feeling (numbness) in the feet and hands.  Tingling in the feet and hands.  Pain that burns.  Very sensitive skin.  Weakness.  Not being able to move a part of the body (paralysis).  Muscle twitching.  Clumsiness or poor coordination.  Loss of balance.  Not being able to control your bladder.  Feeling dizzy.  Sexual problems. DIAGNOSIS  Peripheral neuropathy is a symptom, not a disease. Finding the cause of peripheral neuropathy can be hard. To figure that out, your health care provider will take a medical history and do a physical exam. A neurological exam will also be done. This involves checking things affected by your brain, spinal cord, and nerves (nervous system). For example, your health care provider will check your reflexes, how you move, and what you can feel.  Other types of tests may also be ordered, such as:  Blood tests.  A test of the fluid in your spinal cord.  Imaging  tests, such as CT scans or an MRI.  Electromyography (EMG). This test checks the nerves that control muscles.  Nerve conduction velocity tests. These tests check how fast messages pass through your nerves.  Nerve biopsy. A small piece of nerve is removed. It is then checked under a microscope. TREATMENT   Medicine is often used to treat peripheral neuropathy. Medicines may include:  Pain-relieving medicines. Prescription or over-the-counter medicine may be suggested.  Antiseizure medicine. This may be used for pain.  Antidepressants. These also may help ease pain from neuropathy.  Lidocaine. This is a numbing medicine. You might wear a patch or be given a shot.  Mexiletine. This medicine is typically used to help control irregular heart rhythms.  Surgery.  Surgery may be needed to relieve pressure on a nerve or to destroy a nerve that is causing pain.  Physical therapy to help movement.  Assistive devices to help movement. HOME CARE INSTRUCTIONS   Only take over-the-counter or prescription medicines as directed by your health care provider. Follow the instructions carefully for any given medicines. Do not take any other medicines without first getting approval from your health care provider.  If you have diabetes, work closely with your health care provider to keep your blood sugar under control.  If you have numbness in your feet:  Check every day for signs of injury or infection. Watch for redness, warmth, and swelling.  Wear padded socks and comfortable shoes. These help protect your feet.  Do not do things that put pressure on your damaged nerve.  Do not smoke. Smoking keeps blood from getting to damaged nerves.  Avoid or limit alcohol. Too much alcohol can cause a lack of B vitamins. These vitamins are needed for healthy nerves.  Develop a good support system. Coping with peripheral neuropathy can be stressful. Talk to a mental health specialist or join a support group if you are struggling.  Follow up with your health care provider as directed. SEEK MEDICAL CARE IF:   You have new signs or symptoms of peripheral neuropathy.  You are struggling emotionally from dealing with peripheral neuropathy.  You have a fever. SEEK IMMEDIATE MEDICAL CARE IF:   You have an injury or infection that is not healing.  You feel very dizzy or begin vomiting.  You have chest pain.  You have trouble breathing.   This information is not intended to replace advice given to you by your health care provider. Make sure you discuss any questions you have with your health care provider.   Document Released: 02/04/2002 Document Revised: 10/27/2010 Document Reviewed: 10/22/2012 Elsevier Interactive Patient Education 2016 Elsevier Inc. Metabolic  Syndrome Metabolic syndrome is the presence of at least three factors that increase your risk of getting cardiovascular disease and diabetes. These factors are:  High blood sugar.  High blood triglyceride level.  High blood pressure.  Low levels of good blood cholesterol (high-density lipoprotein or HDL).  Excess weight around the waist. This factor is present with a waist measurement of:  More than 40 inches in men.  More than 35 inches in women. Metabolic syndrome is sometimes called insulin resistance syndrome and syndrome X. CAUSES The exact cause is not known, but genetics and lifestyle choices play a role. RISK FACTORS You are more likely to develop metabolic syndrome if:  You eat a diet high in calories and saturated fat.  You do not exercise regularly.  You are overweight.  You have a family history of metabolic syndrome.  You  are Asian.  You are older in age.  You have insulin resistance.  You use any tobacco products, including cigarettes, chewing tobacco, or electronic cigarettes. SIGNS AND SYMPTOMS Metabolic syndrome has no specific symptoms. DIAGNOSIS To make a diagnosis, your health care provider will determine whether you have at least three of the factors that make up metabolic syndrome by:  Taking your blood pressure.  Measuring your waist.  Ordering blood tests. TREATMENT Treatment may include:  Lifestyle changes to reduce your risk for heart disease and stroke, such as:  Exercise.  Weight loss.  Maintaining a healthy diet.  Quitting the use of any tobacco products, including cigarettes, chewing tobacco, or electronic cigarettes.  Medicines that:  Help your body to maintain glucose control.  Reduce your blood pressure and your blood triglyceride levels. HOME CARE INSTRUCTIONS  Exercise regularly.  Maintain a healthy diet.  Do not use any tobacco products, including cigarettes, chewing tobacco, or electronic cigarettes. If you  need help quitting, ask your health care provider.  Keep all follow-up visits as directed by your health care provider. This is important.  Measure your waist regularly and record the measurement. To measure your waist:  Stand up straight.  Breathe out.  Wrap the measuring tape around the part of your waist that is just above your hipbones.  Read the measurement. SEEK MEDICAL CARE IF:  You feel very tired.  You develop excessive thirst.  You pass large quantities of urine.  You put on weight around your waist.  You have headaches over and over again.  You have a dizzy spell. SEEK IMMEDIATE MEDICAL CARE IF:  You develop sudden blurred vision.  You develop a sudden dizzy spell.  You have sudden trouble speaking or swallowing.  You have sudden weakness in your arm or leg.  You have chest pains or trouble breathing.  You feel like your heartbeat is abnormal.  You faint.   This information is not intended to replace advice given to you by your health care provider. Make sure you discuss any questions you have with your health care provider.   Document Released: 05/24/2007 Document Revised: 03/07/2014 Document Reviewed: 09/20/2013 Elsevier Interactive Patient Education [employer]

## 2015-05-29 ENCOUNTER — Telehealth: Payer: Self-pay

## 2015-05-29 LAB — HEMOGLOBIN A1C
HEMOGLOBIN A1C: 5.9 % — AB (ref <5.7)
Mean Plasma Glucose: 123 mg/dL

## 2015-05-29 NOTE — Telephone Encounter (Signed)
Called and spoke with patient, advised of lab results, advised of elevated hgba1c and the need to start low fat/lowcarb diet. Asked patient to eat 5 to 6 very small meals daily, drink 6 to 8 glasses of water, and to exercise 150 minutes weekly. Patient verbalized understanding and had no other questions at this time. Thanks!

## 2015-05-29 NOTE — Telephone Encounter (Signed)
-----   Message from Massie MaroonLachina M Hollis, [place] sent at 05/29/2015  8:15 AM EDT ----- Regarding: lab results Please inform Ms. Turpen that hemoglobin a1C is 5.9, which is consistent with prediabetes. Recommend a lowfat, low carbohydrate diet divided over 5-6 small meals, increase water intake to 6-8 glasses, and 150 minutes per week of cardiovascular exercise. Will re-check a1C in 6 months. Please follow up as scheduled.  Thanks  ----- Message -----    From: [facility]    Sent: 05/29/2015  12:22 AM      To: Massie MaroonLachina M Hollis, FNP

## 2015-05-29 NOTE — Telephone Encounter (Signed)
Called and left a message for patient to return call regarding labs. Thanks!

## 2015-06-02 ENCOUNTER — Other Ambulatory Visit: Payer: Self-pay

## 2015-06-02 ENCOUNTER — Encounter: Payer: Self-pay | Admitting: Family Medicine

## 2015-06-02 MED ORDER — MECLIZINE HCL 12.5 MG PO TABS: 12.5000 mg | ORAL_TABLET | Freq: Three times a day (TID) | ORAL | Status: DC | PRN

## 2015-06-02 NOTE — Telephone Encounter (Signed)
Refill for meclizine has been sent to pharmacy. Thanks!

## 2015-06-03 ENCOUNTER — Emergency Department (HOSPITAL_COMMUNITY)
Admission: EM | Admit: 2015-06-03 | Discharge: 2015-06-03 | Disposition: A | Discharge status: Home / Self Care | Payer: Medicaid Other | Attending: Emergency Medicine | Admitting: Emergency Medicine

## 2015-06-03 ENCOUNTER — Encounter (HOSPITAL_COMMUNITY): Payer: Self-pay | Admitting: *Deleted

## 2015-06-03 DIAGNOSIS — Z791 Long term (current) use of non-steroidal anti-inflammatories (NSAID): Secondary | ICD-10-CM | POA: Diagnosis not present

## 2015-06-03 DIAGNOSIS — G40909 Epilepsy, unspecified, not intractable, without status epilepticus: Secondary | ICD-10-CM | POA: Insufficient documentation

## 2015-06-03 DIAGNOSIS — F17210 Nicotine dependence, cigarettes, uncomplicated: Secondary | ICD-10-CM | POA: Insufficient documentation

## 2015-06-03 DIAGNOSIS — Z79899 Other long term (current) drug therapy: Secondary | ICD-10-CM | POA: Insufficient documentation

## 2015-06-03 DIAGNOSIS — R569 Unspecified convulsions: Secondary | ICD-10-CM | POA: Diagnosis present

## 2015-06-03 DIAGNOSIS — E669 Obesity, unspecified: Secondary | ICD-10-CM | POA: Insufficient documentation

## 2015-06-03 LAB — CBG MONITORING, ED: GLUCOSE-CAPILLARY: 112 mg/dL — AB (ref 65–99)

## 2015-06-03 MED ORDER — LORAZEPAM 2 MG/ML IJ SOLN
1.0000 mg | Freq: Once | INTRAMUSCULAR | Status: AC
  Administered 2015-06-03: 1 mg via INTRAVENOUS
  Filled 2015-06-03: qty 1

## 2015-06-03 MED ORDER — LACOSAMIDE 200 MG/20ML IV SOLN
100.0000 mg | Freq: Two times a day (BID) | INTRAVENOUS | Status: DC
  Administered 2015-06-03: 100 mg via INTRAVENOUS
  Filled 2015-06-03: qty 10

## 2015-06-03 MED ORDER — OXYCODONE-ACETAMINOPHEN 5-325 MG PO TABS: 1.0000 | ORAL_TABLET | Freq: Three times a day (TID) | ORAL | Status: DC | PRN

## 2015-06-03 MED ORDER — DIAZEPAM 10 MG RE GEL: 10.0000 mg | Freq: Once | RECTAL | Status: DC

## 2015-06-03 MED ORDER — SODIUM CHLORIDE 0.9 % IV SOLN
1500.0000 mg | INTRAVENOUS | Status: AC
  Administered 2015-06-03: 1500 mg via INTRAVENOUS
  Filled 2015-06-03: qty 15

## 2015-06-03 MED ORDER — SODIUM CHLORIDE 0.9 % IV SOLN
1000.0000 mg | Freq: Once | INTRAVENOUS | Status: DC
  Filled 2015-06-03: qty 10

## 2015-06-03 MED ORDER — MORPHINE SULFATE (PF) 4 MG/ML IV SOLN
4.0000 mg | Freq: Once | INTRAVENOUS | Status: AC
  Administered 2015-06-03: 4 mg via INTRAVENOUS
  Filled 2015-06-03: qty 1

## 2015-06-03 NOTE — ED Notes (Signed)
Pt able to assist when getting into a gown and following commands until Family member came into room

## 2015-06-03 NOTE — Discharge Instructions (Signed)

## 2015-06-03 NOTE — ED Notes (Signed)
Per EMS, pt from home, family called d/t a focal seizure.  Reports has hx of epilepsy.   States pt's arms was shaking but pt held her R arm up when EMS told her he needed to check her BP.

## 2015-06-03 NOTE — ED Provider Notes (Signed)
CSN: [id number]     Arrival date & time 06/03/15  1630 History   First MD Initiated Contact with Patient 06/03/15 1644     Chief Complaint  Patient presents with  . 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 Pt's aunt is at bedside. She was seen here 8 days ago for the same problem however her aunt states she has not missed a dose of her seizure medicines for at least 5 days. Last dose was this morning. She takes Keppra, Vimpat, and Gabapentin. Aunt states the patient was sitting at home when she started complaining of numbness in her leg and then had a seizure that lasted 5 min. She regained consciousness and began to have another seizure a couple hours later. EMS has brought her in today.    Patient is a 30 y.o. female presenting with seizures.  Seizures   Past Medical History  Diagnosis Date  . Seizures (HCC)   . Neuropathy (HCC)   . Epilepsy (HCC)   . Neuropathy (HCC)   . Headache    Past Surgical History  Procedure Laterality Date  . Cesarean section  2011  . Ovarian cyst surgery     No family history on file. Social History  Substance Use Topics  . Smoking status: Current Every Day Smoker -- 0.25 packs/day    Types: Cigarettes  . Smokeless tobacco: Never Used  . Alcohol Use: Yes     Comment: occ   OB History    Gravida Para Term Preterm AB TAB SAB Ectopic Multiple Living   2 1 1  1     1      Review of Systems  Constitutional: Negative for fever and chills.  Neurological: Positive for seizures.    Allergies  Pineapple  Home Medications   Prior to Admission medications   Medication Sig Start Date End Date Taking? Authorizing Provider  acetaminophen (TYLENOL) 500 MG tablet Take 1 tablet (500 mg total) by mouth every 6 (six) hours as needed. Patient taking differently: Take 500 mg by mouth every 6 (six) hours as needed for moderate pain.  05/26/15  Yes Everlene FarrierWilliam Dansie, PA-C  cyclobenzaprine  (FLEXERIL) 10 MG tablet Take 10 mg by mouth 3 (three) times daily as needed for muscle spasms.    Yes Historical Provider, MD  furosemide (LASIX) 20 MG tablet Take 1 tablet (20 mg total) by mouth daily. 05/28/15  Yes Massie MaroonLachina M Hollis, FNP  gabapentin (NEURONTIN) 300 MG capsule Take 1 capsule (300 mg total) by mouth 3 (three) times daily. 05/28/15  Yes Massie MaroonLachina M Hollis, FNP  Lacosamide (VIMPAT) 100 MG TABS Take 1 tablet (100 mg total) by mouth 2 (two) times daily. 05/28/15  Yes Massie MaroonLachina M Hollis, FNP  levETIRAcetam (KEPPRA) 500 MG tablet Take 3 tablets (1,500 mg total) by mouth 2 (two) times daily. 05/28/15  Yes Massie MaroonLachina M Hollis, FNP  meclizine (ANTIVERT) 12.5 MG tablet Take 1 tablet (12.5 mg total) by mouth 3 (three) times daily as needed for dizziness. 06/02/15  Yes Massie MaroonLachina M Hollis, FNP  naproxen (NAPROSYN) 500 MG tablet Take 1 tablet (500 mg total) by mouth 2 (two) times daily. 05/16/15  Yes Hope Orlene OchM Neese, NP  sertraline (ZOLOFT) 100 MG tablet Take 1 tablet (100 mg total) by mouth daily. 05/28/15  Yes Massie MaroonLachina M Hollis, FNP  butalbital-acetaminophen-caffeine (FIORICET) [phone] MG tablet Take 1-2 tablets by mouth every 6 (six) hours as needed for headache. 02/15/15  02/15/16  Fayrene Helper, PA-C  diazepam (DIASTAT ACUDIAL) 10 MG GEL Place 10 mg rectally once. 06/03/15   Marily Memos, MD  oxyCODONE-acetaminophen (PERCOCET) 5-325 MG tablet Take 1 tablet by mouth every 8 (eight) hours as needed. 06/03/15   Marily Memos, MD   BP 155/91 mmHg  Pulse 80  Temp(Src) 97.9 F (36.6 C) (Axillary)  Resp 22  SpO2 94%  LMP 05/06/2015   Physical Exam  Constitutional: She appears well-developed and well-nourished. No distress.  Obese  HENT:  Head: Normocephalic and atraumatic.  Eyes: Conjunctivae are normal. Pupils are equal, round, and reactive to light.  Eye movement  Cardiovascular: Normal rate and regular rhythm.  Exam reveals no gallop and no friction rub.   No murmur heard. Pulmonary/Chest: Effort normal and  breath sounds normal. No respiratory distress. She has no wheezes. She has no rales. She exhibits no tenderness.  Neurological:  Not responding to questions. Focal jerking of throat, clenched jaw, bilateral clenched fists, and rhythmic jerking of L thigh.  Skin: Skin is warm and dry.  Psychiatric: She has a normal mood and affect.    ED Course  Procedures (including critical care time) Labs Review Labs Reviewed  CBG MONITORING, ED - Abnormal; Notable for the following:    Glucose-Capillary 112 (*)    All other components within normal limits   Meds given in ED:  Medications  LORazepam (ATIVAN) injection 1 mg (1 mg Intravenous Given 06/03/15 1756)  levETIRAcetam (KEPPRA) 1,500 mg in sodium chloride 0.9 % 100 mL IVPB (0 mg Intravenous Stopped 06/03/15 2028)  morphine 4 MG/ML injection 4 mg (4 mg Intravenous Given 06/03/15 1930)    Discharge Medication List as of 06/03/2015  8:50 PM    START taking these medications   Details  diazepam (DIASTAT ACUDIAL) 10 MG GEL Place 10 mg rectally once., Starting 06/03/2015, Print    oxyCODONE-acetaminophen (PERCOCET) 5-325 MG tablet Take 1 tablet by mouth every 8 (eight) hours as needed., Starting 06/03/2015, Until Discontinued, Print         MDM   Final diagnoses:  Seizure (HCC)   30 year old female with ongoing seizure.  Ativan given which has stopped her seizure and she is able to talk.   On reexamination pt is alert however lethargic. She responds appropriately to questions. Now her only complaints are pain in her thighs and numbness in her hands in feet (which is not new). Normal neuro exam. Will give a dose of pain medicine here however pt is already drowsy so will only give one dose. Keppra and Vimpat dose will be given here. Shared visit with Dr. Clayborne Dana. Rx for Diastat and narcotic pain meds will be given. Pt has an appointment with neurology on April 10th which she states she will go to. Pt feels improved after observation and/or treatment  in ED. She is NAD with stable VS. Patient and aunt informed of clinical course, understand medical decision-making process, and agree with plan.     Bethel Born, PA-C 06/04/15 0038  Marily Memos, MD 06/04/15 [phone]

## 2015-06-03 NOTE — ED Provider Notes (Signed)
Medical screening examination/treatment/procedure(s) were conducted as a shared visit with non-physician practitioner(s) and myself.  I personally evaluated the patient during the encounter.  Here for possible seizure activity. Unsure if today's episode was valid. Either way, she is neurologically itnact her. Exam benign. Neuro exam unremarkable. Stable for dc with neurology follow up that is already scheduled.   Marily MemosJason Nimo Verastegui, MD 06/03/15 2053

## 2015-06-08 ENCOUNTER — Encounter: Payer: Self-pay | Admitting: Neurology

## 2015-06-08 ENCOUNTER — Ambulatory Visit (INDEPENDENT_AMBULATORY_CARE_PROVIDER_SITE_OTHER): Payer: Medicaid Other | Admitting: Neurology

## 2015-06-08 VITALS — BP 114/80 | HR 83 | Resp 16 | Ht 62.0 in | Wt 237.0 lb

## 2015-06-08 DIAGNOSIS — F32A Depression, unspecified: Secondary | ICD-10-CM

## 2015-06-08 DIAGNOSIS — F411 Generalized anxiety disorder: Secondary | ICD-10-CM

## 2015-06-08 DIAGNOSIS — F329 Major depressive disorder, single episode, unspecified: Secondary | ICD-10-CM | POA: Diagnosis not present

## 2015-06-08 DIAGNOSIS — G894 Chronic pain syndrome: Secondary | ICD-10-CM | POA: Diagnosis not present

## 2015-06-08 DIAGNOSIS — G629 Polyneuropathy, unspecified: Secondary | ICD-10-CM

## 2015-06-08 DIAGNOSIS — G40909 Epilepsy, unspecified, not intractable, without status epilepticus: Secondary | ICD-10-CM

## 2015-06-08 DIAGNOSIS — R569 Unspecified convulsions: Secondary | ICD-10-CM | POA: Diagnosis not present

## 2015-06-08 DIAGNOSIS — G43009 Migraine without aura, not intractable, without status migrainosus: Secondary | ICD-10-CM | POA: Insufficient documentation

## 2015-06-08 MED ORDER — LEVETIRACETAM 500 MG PO TABS: 1500.0000 mg | ORAL_TABLET | Freq: Two times a day (BID) | ORAL | Status: DC

## 2015-06-08 MED ORDER — GABAPENTIN 300 MG PO CAPS: ORAL_CAPSULE | ORAL | Status: DC

## 2015-06-08 MED ORDER — LACOSAMIDE 100 MG PO TABS: 100.0000 mg | ORAL_TABLET | Freq: Two times a day (BID) | ORAL | Status: DC

## 2015-06-08 NOTE — Patient Instructions (Addendum)
1. Continue Keppra and Vimpat 2. Increase Gabapentin 300mg : Take 1 cap in AM, 1 cap at noon, 2 caps at night for 1 week, then increase to 2 caps in AM, 1 cap at noon, 2 caps at night 3. Refer to Behavioral Medicine for anxiety and depression 4. Refer to Pain Management [facility]. [facility] 6. As per [place] driving laws, no driving until 6 months seizure-free
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
## 2015-06-10 ENCOUNTER — Other Ambulatory Visit: Payer: Self-pay | Admitting: Family Medicine

## 2015-06-10 DIAGNOSIS — F411 Generalized anxiety disorder: Secondary | ICD-10-CM

## 2015-06-10 DIAGNOSIS — F32A Depression, unspecified: Secondary | ICD-10-CM

## 2015-06-10 DIAGNOSIS — F329 Major depressive disorder, single episode, unspecified: Secondary | ICD-10-CM

## 2015-06-10 DIAGNOSIS — G894 Chronic pain syndrome: Secondary | ICD-10-CM

## 2015-06-10 DIAGNOSIS — R569 Unspecified convulsions: Secondary | ICD-10-CM

## 2015-06-14 ENCOUNTER — Telehealth: Payer: Self-pay | Admitting: Neurology

## 2015-06-14 NOTE — Telephone Encounter (Signed)
[place] were [place]. Awake EEG done 07/24/14 normal. MRI brain without contrast done 07/21/14 normal. Records from hospital where she was diagnosed with frontal lobe seizures were unavailable.

## 2015-06-16 ENCOUNTER — Telehealth: Payer: Self-pay | Admitting: Family Medicine

## 2015-06-16 ENCOUNTER — Encounter: Payer: Self-pay | Admitting: Family Medicine

## 2015-06-16 ENCOUNTER — Encounter: Payer: Self-pay | Admitting: Neurology

## 2015-06-16 NOTE — Telephone Encounter (Signed)
Returned call to let Dr Karel JarvisAquino know that there were no more seizures and PT said she feels better and did not have to go to the hospital, did not get the name of who called but it came up [facility]/Dawn

## 2015-06-16 NOTE — Telephone Encounter (Signed)
Patients aunt/Angela Doyle called stating that patient was actively having a seizure and she needed to know what to do. She stated at the time patient had been seizing for about 20 mins, patient was laying in the bed. She was convulsing but was able to speak and answer questions. While we were on the phone when she went back into the patients bedroom she stated that patient was rigid and while I was on the phone with her she was asking her some simple questions like names and patient wouldn't answer her. She states that she didn't take patient to the ED because when patient was seen in the office Dr. Karel JarvisAquino told her not to take her to the ED, but to give our office a call if she had a seizure.  I pulled Dr. Karel JarvisAquino out of a room and explained what was going on with the patient, she advised me to tell Angela NickelBeatrice to make sure patient was laying on her side, and to talk to the patient gently and calmly to try and get her to come out of the seizure and if after about 10-15 mins if patient hasn't started coming down she could go ahead and take her to the ED. She verbalized good understanding and stated she would do what was advised. I told her I would call her back in 15 mins to see how the patient was doing.   I called Angela NickelBeatrice back to see how patient was doing she stated that the patient was doing "much" better now, and she was fully aware and answering all questions. She states she also used Diazepam Gel 10 mg, patient had a Rx that she was given at the hospital to use as rescue med for seizures. I advised that if patient was c/o pain after seizure from the rigidness she experienced during the seizure  she could take either Ibuprofen or Tylenol.

## 2015-06-16 NOTE — Telephone Encounter (Signed)
Noted, thanks!

## 2015-06-21 ENCOUNTER — Encounter (HOSPITAL_COMMUNITY): Payer: Self-pay | Admitting: Emergency Medicine

## 2015-06-21 ENCOUNTER — Emergency Department (HOSPITAL_COMMUNITY)
Admission: EM | Admit: 2015-06-21 | Discharge: 2015-06-21 | Disposition: A | Discharge status: Home / Self Care | Payer: Medicaid Other | Attending: Emergency Medicine | Admitting: Emergency Medicine

## 2015-06-21 DIAGNOSIS — G43909 Migraine, unspecified, not intractable, without status migrainosus: Secondary | ICD-10-CM

## 2015-06-21 DIAGNOSIS — G43009 Migraine without aura, not intractable, without status migrainosus: Secondary | ICD-10-CM | POA: Diagnosis not present

## 2015-06-21 DIAGNOSIS — Z79899 Other long term (current) drug therapy: Secondary | ICD-10-CM | POA: Insufficient documentation

## 2015-06-21 DIAGNOSIS — G40909 Epilepsy, unspecified, not intractable, without status epilepticus: Secondary | ICD-10-CM | POA: Diagnosis not present

## 2015-06-21 DIAGNOSIS — F17210 Nicotine dependence, cigarettes, uncomplicated: Secondary | ICD-10-CM | POA: Insufficient documentation

## 2015-06-21 DIAGNOSIS — G629 Polyneuropathy, unspecified: Secondary | ICD-10-CM | POA: Insufficient documentation

## 2015-06-21 DIAGNOSIS — R51 Headache: Secondary | ICD-10-CM | POA: Diagnosis present

## 2015-06-21 MED ORDER — BUTALBITAL-APAP-CAFFEINE 50-325-40 MG PO TABS: 1.0000 | ORAL_TABLET | Freq: Four times a day (QID) | ORAL | Status: DC | PRN

## 2015-06-21 MED ORDER — METOCLOPRAMIDE HCL 5 MG/ML IJ SOLN: 10.0000 mg | Freq: Once | INTRAMUSCULAR | Status: DC

## 2015-06-21 MED ORDER — DIPHENHYDRAMINE HCL 50 MG/ML IJ SOLN: 25.0000 mg | Freq: Once | INTRAMUSCULAR | Status: DC

## 2015-06-21 MED ORDER — ONDANSETRON 4 MG PO TBDP
4.0000 mg | ORAL_TABLET | Freq: Once | ORAL | Status: AC
  Administered 2015-06-21: 4 mg via ORAL
  Filled 2015-06-21: qty 1

## 2015-06-21 MED ORDER — BUTALBITAL-APAP-CAFFEINE 50-325-40 MG PO TABS
2.0000 | ORAL_TABLET | Freq: Once | ORAL | Status: AC
  Administered 2015-06-21: 2 via ORAL
  Filled 2015-06-21: qty 2

## 2015-06-21 MED ORDER — KETOROLAC TROMETHAMINE 30 MG/ML IJ SOLN: 30.0000 mg | Freq: Once | INTRAMUSCULAR | Status: DC

## 2015-06-21 NOTE — ED Provider Notes (Signed)
CSN: [id number]     Arrival date & time 06/21/15  0259 History   First MD Initiated Contact with Patient 06/21/15 [phone]     Chief Complaint  Patient presents with  . Headache     (Consider location/radiation/quality/duration/timing/severity/associated sxs/prior Treatment) HPI Comments: Patient with a history of Migraines and Seizure Disorder presents today with a chief complaint of headache.  Headache is frontal and has been present for the past week.  She states that she was up with her friend all night last night who was a patient in the ED and the lack of sleep made the headache worse this morning.  She states that the headache is similar to previous migraines.  She denies any acute head injury or trauma.  She reports associated nausea and two episodes of vomiting.  She also reports associated photophobia.  She has not taken anything for symptoms prior to arrival.  She states that she normally takes Fioricet for her migraines, but recently ran out of the medication.  She denies fever, chills, vision changes, neck pain/stiffness, focal weakness, numbness, tingling, seizure activity, or abdominal pain.    The history is provided by the patient.    Past Medical History  Diagnosis Date  . Seizures (HCC)   . Neuropathy (HCC)   . Epilepsy (HCC)   . Neuropathy (HCC)   . Headache   . Vertigo    Past Surgical History  Procedure Laterality Date  . Cesarean section  2011  . Ovarian cyst surgery     Family History  Problem Relation Age of Onset  . Neuropathy Mother   . Heart disease Maternal Grandmother   . Diabetes Maternal Grandmother    Social History  Substance Use Topics  . Smoking status: Current Every Day Smoker -- 0.25 packs/day    Types: Cigarettes  . Smokeless tobacco: Never Used  . Alcohol Use: 0.0 oz/week    0 Standard drinks or equivalent per week     Comment: occ   OB History    Gravida Para Term Preterm AB TAB SAB Ectopic Multiple Living   2 1 1  1     1       Review of Systems  All other systems reviewed and are negative.     Allergies  Pineapple  Home Medications   Prior to Admission medications   Medication Sig Start Date End Date Taking? Authorizing Provider  butalbital-acetaminophen-caffeine (FIORICET) 769762383550-325-40 MG tablet Take 1-2 tablets by mouth every 6 (six) hours as needed for headache. 02/15/15 02/15/16 Yes Fayrene HelperBowie Tran, PA-C  gabapentin (NEURONTIN) 300 MG capsule Take 2 caps in AM, 1 cap at noon, 2 caps at night 06/08/15  Yes Van ClinesKaren M Aquino, MD  Lacosamide (VIMPAT) 100 MG TABS Take 1 tablet (100 mg total) by mouth 2 (two) times daily. 06/08/15  Yes Van ClinesKaren M Aquino, MD  levETIRAcetam (KEPPRA) 500 MG tablet Take 3 tablets (1,500 mg total) by mouth 2 (two) times daily. 06/08/15  Yes Van ClinesKaren M Aquino, MD  meclizine (ANTIVERT) 12.5 MG tablet Take 1 tablet (12.5 mg total) by mouth 3 (three) times daily as needed for dizziness. 06/02/15  Yes Massie MaroonLachina M Hollis, FNP  sertraline (ZOLOFT) 100 MG tablet Take 1 tablet (100 mg total) by mouth daily. 05/28/15  Yes Massie MaroonLachina M Hollis, FNP  acetaminophen (TYLENOL) 500 MG tablet Take 1 tablet (500 mg total) by mouth every 6 (six) hours as needed. Patient not taking: Reported on 06/21/2015 05/26/15   Everlene FarrierWilliam Dansie, PA-C  diazepam (DIASTAT ACUDIAL)  10 MG GEL Place 10 mg rectally once. Patient not taking: Reported on 06/08/2015 06/03/15   Marily Memos, MD  furosemide (LASIX) 20 MG tablet Take 1 tablet (20 mg total) by mouth daily. Patient not taking: Reported on 06/21/2015 05/28/15   Massie Maroon, FNP  naproxen (NAPROSYN) 500 MG tablet Take 1 tablet (500 mg total) by mouth 2 (two) times daily. Patient not taking: Reported on 06/21/2015 05/16/15   Janne Napoleon, NP  oxyCODONE-acetaminophen (PERCOCET) 5-325 MG tablet Take 1 tablet by mouth every 8 (eight) hours as needed. Patient not taking: Reported on 06/21/2015 06/03/15   Marily Memos, MD   BP 134/86 mmHg  Pulse 86  Temp(Src) 98.3 F (36.8 C) (Oral)  Resp 20  Ht   (1.575 m)  Wt 107.276 kg  BMI 43.25 kg/m2  SpO2 97%  LMP 05/01/2015 Physical Exam  Constitutional: She appears well-developed and well-nourished.  HENT:  Head: Normocephalic and atraumatic.  Mouth/Throat: Oropharynx is clear and moist.  Eyes: EOM are normal. Pupils are equal, round, and reactive to light.  Neck: Normal range of motion. Neck supple.  No nuchal rigidity  Cardiovascular: Normal rate, regular rhythm and normal heart sounds.   Pulmonary/Chest: Effort normal and breath sounds normal.  Musculoskeletal: Normal range of motion.  Neurological: She is alert. She has normal strength. No cranial nerve deficit or sensory deficit. Coordination and gait normal.  Normal finger to nose testing, no dysmetria Normal rapid alternating movements   Skin: Skin is warm and dry.  Psychiatric: She has a normal mood and affect.  Nursing note and vitals reviewed.   ED Course  Procedures (including critical care time) Labs Review Labs Reviewed - No data to display  Imaging Review No results found. I have personally reviewed and evaluated these images and lab results as part of my medical decision-making.   EKG Interpretation None     7:07 AM Reassessed patient.  She reports some improvement in her headache at this time. MDM   Final diagnoses:  None   Pt HA treated and improved while in ED.  Presentation is like pts typical HA and non concerning for [facility], ICH, Meningitis, or temporal arteritis. Pt is afebrile with no focal neuro deficits, nuchal rigidity, or change in vision. Pt is to follow up with her Neurologist. Pt verbalizes understanding and is agreeable with plan to dc.  Return precautions given.     Santiago Glad, PA-C 06/21/15 0754  Laurence Spates, MD 06/22/15 [phone]

## 2015-06-21 NOTE — ED Notes (Signed)
Pt. reports migraine headache for 2 days with mild dizziness, denies nausea or vomitting , no fever or photophobia .

## 2015-06-26 ENCOUNTER — Encounter: Payer: Self-pay | Admitting: Neurology

## 2015-06-26 ENCOUNTER — Telehealth: Payer: Self-pay | Admitting: Family Medicine

## 2015-06-26 ENCOUNTER — Encounter: Payer: Self-pay | Admitting: Family Medicine

## 2015-06-26 NOTE — Telephone Encounter (Signed)
Called patient after she sent a my charge message asking about the status of her pain management referral. I did speak with [facility] referral coordinator/Gayle today and she told me that patients records were still in review, but they should be done in 24-48 hours. They will call patient with an appt date.   I did relay this information to the patient and advised her to give me a call back if she hasn't heard anything form their office by Wed morning of next week. She asked what she should do in the meantime for her pain. I explained to her since our office doesn't prescribe pain medication that she could try taking otc meds for pain.

## 2015-06-28 ENCOUNTER — Other Ambulatory Visit: Payer: Self-pay | Admitting: Family Medicine

## 2015-07-01 ENCOUNTER — Telehealth: Payer: Self-pay | Admitting: Neurology

## 2015-07-01 NOTE — Telephone Encounter (Signed)
PT called in regards to a referral to a pain management clinic, she said she is in a lot of pain/Dawn CB#[phone]

## 2015-07-02 ENCOUNTER — Encounter: Payer: Self-pay | Admitting: Family Medicine

## 2015-07-02 ENCOUNTER — Ambulatory Visit: Payer: Medicaid Other | Admitting: Family Medicine

## 2015-07-02 NOTE — Telephone Encounter (Signed)
Returned call. Notified patient that I have called [facility] to check on status of her referral and was told that it's still pending. Once review process is complete they will call patient to schedule an appt.

## 2015-07-13 ENCOUNTER — Encounter: Payer: Self-pay | Admitting: Family Medicine

## 2015-07-13 ENCOUNTER — Telehealth: Payer: Self-pay | Admitting: Family Medicine

## 2015-07-13 ENCOUNTER — Encounter: Payer: Self-pay | Admitting: Neurology

## 2015-07-13 NOTE — Telephone Encounter (Signed)
Patient returned my call. Notified her of all appt information.

## 2015-07-13 NOTE — Telephone Encounter (Signed)
According to the last clinic visit, she was [facility]. Would not make any changes to her AEDs at this time.  Whole body pain is more of a chronic pain syndrome, which our office does not manage.

## 2015-07-13 NOTE — Telephone Encounter (Signed)
Called patient lmovm to rtn my call. I called [facility] to check on status of her referral. I was able to get appt scheduled for her. She is scheduled to see Dr. Nilsa Nuttingakwa on 07/21/15 with arrival time of 9:30 am, [facility] [address] She is to bring her ins card & 2 forms of valid id.

## 2015-07-13 NOTE — Telephone Encounter (Signed)
Patient send my chart msg stating that she is having non stop pain. She is scheduled with [facility] on 07/21/15.  She states the pain is all over her body. She is still taking all of her medications. States she had a seizure last week that lasted about 5 min. Any further advisements?

## 2015-07-13 NOTE — Telephone Encounter (Signed)
Spoke with patient. She is aware of her pain mgmnt appt. She also has Video EEG scheduled for 08/10/15 @ [facility]. She will continue all of her current medications for now.

## 2015-07-15 ENCOUNTER — Ambulatory Visit (HOSPITAL_BASED_OUTPATIENT_CLINIC_OR_DEPARTMENT_OTHER): Payer: Medicaid Other | Attending: Family Medicine | Admitting: Internal Medicine

## 2015-07-15 DIAGNOSIS — R0902 Hypoxemia: Secondary | ICD-10-CM | POA: Diagnosis not present

## 2015-07-15 DIAGNOSIS — G4733 Obstructive sleep apnea (adult) (pediatric): Secondary | ICD-10-CM | POA: Insufficient documentation

## 2015-07-19 DIAGNOSIS — G4733 Obstructive sleep apnea (adult) (pediatric): Secondary | ICD-10-CM | POA: Diagnosis not present

## 2015-07-19 NOTE — Procedures (Signed)
  Patient Name: Angela Doyle, Angela Doyle Study Date: 07/15/2015 Gender: Female D.O.B: 12-Jun-1985 Age (years): 29 Referring Provider: Julianne HandlerLachina Hollis Height (inches): 62 Interpreting Physician: Jetty Duhamellinton Cambri Plourde MD, ABSM Weight (lbs): 230 RPSGT: Ulyess MortSpruill, Vicki BMI: 42 MRN: [id number] Neck Size: 16.00 CLINICAL INFORMATION Sleep Study Type: NPSG Indication for sleep study: Fatigue, Obesity, OSA, Snoring Epworth Sleepiness Score: 7  SLEEP STUDY TECHNIQUE As per the AASM Manual for the Scoring of Sleep and Associated Events v2.3 (April 2016) with a hypopnea requiring 4% desaturations. The channels recorded and monitored were frontal, central and occipital EEG, electrooculogram (EOG), submentalis EMG (chin), nasal and oral airflow, thoracic and abdominal wall motion, anterior tibialis EMG, snore microphone, electrocardiogram, and pulse oximetry.  MEDICATIONS Patient's medications include: charted for review Medications self-administered by patient during sleep study : NEURONTIN, vimpat, KEPPRA, ANTIVERT.  SLEEP ARCHITECTURE The study was initiated at 10:09:23 PM and ended at 4:39:02 AM. Sleep onset time was 4.7 minutes and the sleep efficiency was 97.3%. The total sleep time was 379.0 minutes. Stage REM latency was 107.5 minutes. The patient spent 11.61% of the night in stage N1 sleep, 73.10% in stage N2 sleep, 0.00% in stage N3 and 15.29% in REM. Alpha intrusion was absent. Supine sleep was 20.84%.  RESPIRATORY PARAMETERS The overall apnea/hypopnea index (AHI) was 15.2 per hour. There were 7 total apneas, including 6 obstructive, 0 central and 1 mixed apneas. There were 89 hypopneas and 10 RERAs. The AHI during Stage REM sleep was 42.4 per hour. AHI while supine was 35.7 per hour. The mean oxygen saturation was 93.70%. The minimum SpO2 during sleep was 71.00%. Moderate snoring was noted during this study.  CARDIAC DATA The 2 lead EKG demonstrated sinus rhythm. The mean heart rate was 77.62  beats per minute. Other EKG findings include: None.  LEG MOVEMENT DATA The total PLMS were 0 with a resulting PLMS index of 0.00. Associated arousal with leg movement index was 0.0 .  IMPRESSIONS - Moderate obstructive sleep apnea occurred during this study (AHI = 15.2/h). - There were not enough early events to meet protocol requirements for split CPAP titration. - No significant central sleep apnea occurred during this study (CAI = 0.0/h). - Moderate oxygen desaturation was noted during this study (Min O2 = 71.00%). - The patient snored with Moderate snoring volume. - No cardiac abnormalities were noted during this study. - Clinically significant periodic limb movements did not occur during sleep. No significant associated arousals.  DIAGNOSIS - Obstructive Sleep Apnea (327.23 [G47.33 ICD-10]) - Nocturnal Hypoxemia (327.26 [G47.36 ICD-10])  RECOMMENDATIONS - Therapeutic CPAP titration to determine optimal pressure required to alleviate sleep disordered breathing. - Positional therapy avoiding supine position during sleep. - Avoid alcohol, sedatives and other CNS depressants that may worsen sleep apnea and disrupt normal sleep architecture. - Sleep hygiene should be reviewed to assess factors that may improve sleep quality. - Weight management and regular exercise should be initiated or continued if appropriate.  Waymon BudgeYOUNG,Adylee Leonardo D Diplomate, American Board of Sleep Medicine  ELECTRONICALLY SIGNED ON:  07/19/2015, 1:36 PM  [facility] PH: [phone]   FX: [phone] ACCREDITED BY THE AMERICAN ACADEMY OF SLEEP MEDICINE

## 2015-07-23 ENCOUNTER — Ambulatory Visit (INDEPENDENT_AMBULATORY_CARE_PROVIDER_SITE_OTHER): Payer: Medicaid Other | Admitting: Family Medicine

## 2015-07-23 ENCOUNTER — Encounter: Payer: Self-pay | Admitting: Family Medicine

## 2015-07-23 VITALS — BP 122/82 | HR 92 | Temp 98.4°F | Resp 16 | Ht 62.0 in | Wt 240.0 lb

## 2015-07-23 DIAGNOSIS — G4733 Obstructive sleep apnea (adult) (pediatric): Secondary | ICD-10-CM

## 2015-07-23 DIAGNOSIS — K5900 Constipation, unspecified: Secondary | ICD-10-CM

## 2015-07-23 DIAGNOSIS — R002 Palpitations: Secondary | ICD-10-CM

## 2015-07-23 DIAGNOSIS — F411 Generalized anxiety disorder: Secondary | ICD-10-CM

## 2015-07-23 DIAGNOSIS — G629 Polyneuropathy, unspecified: Secondary | ICD-10-CM

## 2015-07-23 DIAGNOSIS — R569 Unspecified convulsions: Secondary | ICD-10-CM | POA: Diagnosis not present

## 2015-07-23 DIAGNOSIS — G40909 Epilepsy, unspecified, not intractable, without status epilepticus: Secondary | ICD-10-CM

## 2015-07-23 DIAGNOSIS — G894 Chronic pain syndrome: Secondary | ICD-10-CM

## 2015-07-23 LAB — VITAMIN B12: Vitamin B-12: 770 pg/mL (ref 200–1100)

## 2015-07-23 MED ORDER — DOCUSATE SODIUM 100 MG PO CAPS: 100.0000 mg | ORAL_CAPSULE | Freq: Every day | ORAL | Status: DC

## 2015-07-23 MED ORDER — ALPRAZOLAM 0.25 MG PO TABS: 0.2500 mg | ORAL_TABLET | Freq: Two times a day (BID) | ORAL | Status: DC | PRN

## 2015-07-23 MED ORDER — BUSPIRONE HCL 5 MG PO TABS: 5.0000 mg | ORAL_TABLET | Freq: Two times a day (BID) | ORAL | Status: DC

## 2015-07-23 NOTE — Progress Notes (Signed)
Subjective:    Patient ID: Angela Doyle, female    DOB: 11/04/1985, 30 y.o.   MRN: [id number]  HPI Ms. Angela Doyle, a 30 year old female with a history of seizure disorder and neuropathy presents for a follow up. Angela Doyle has a history of seizure disorder.  Patient's most recent seizure was on yesterday. She says that she has been taking medications consistently. Episode duration is several minutes.Seizures appear to be precipitated by stress and anxiety. She does not have a history of CVA. She does not have a history of alcohol or drug abuse. Patient is under the care of neurology and was last seen on 06/08/2015.      She describes symptoms of neuropathy. She states that she has had neuropathy for several years that has been uncontrolled on gabapentin. Gabapentin was recently increased by neurology 1 month ago. The patient denies fatigue, recent falls, weakness.   Patient also reports a history of depression. complains of anxiety.  She states that the onset was several years ago and it was primarily situational initially. She reports occasional symptoms of inability to concentrate, feelings of being out of control,  anhedonia, depressed mood and hopelessness.   She denies current suicidal and homicidal plan or intent.  She states that symptoms are controlled on Zoloft.   Past Medical History  Diagnosis Date  . Seizures (HCC)   . Neuropathy (HCC)   . Epilepsy (HCC)   . Neuropathy (HCC)   . Headache   . Vertigo    Immunization History  Administered Date(s) Administered  . Pneumococcal Polysaccharide-23 05/28/2015    Allergies  Allergen Reactions  . Pineapple Swelling    Oral swelling   Past Surgical History  Procedure Laterality Date  . Cesarean section  2011  . Ovarian cyst surgery     Social History   Social History  . Marital Status: Single    Spouse Name: N/A  . Number of Children: 1  . Years of Education: N/A   Occupational History  . Un employed    Social  History Main Topics  . Smoking status: Current Every Day Smoker -- 0.25 packs/day    Types: Cigarettes  . Smokeless tobacco: Never Used  . Alcohol Use: 0.0 oz/week    0 Standard drinks or equivalent per week     Comment: occ  . Drug Use: No  . Sexual Activity: Yes   Other Topics Concern  . Not on file   Social History Narrative   Review of Systems  Constitutional: Negative for fever and fatigue.  HENT: Negative for congestion.   Eyes: Positive for photophobia. Negative for visual disturbance.  Respiratory: Negative.  Negative for chest tightness, shortness of breath and wheezing.   Cardiovascular: Negative for chest pain and palpitations.  Gastrointestinal: Negative.   Endocrine: Negative.  Negative for cold intolerance, heat intolerance, polydipsia, polyphagia and polyuria.  Genitourinary: Negative.   Musculoskeletal: Negative.   Skin: Negative.   Allergic/Immunologic: Negative.   Neurological: Positive for seizures, numbness and headaches.  Hematological: Negative.   Psychiatric/Behavioral: Negative for suicidal ideas and sleep disturbance. The patient is nervous/anxious.        History of depression       Objective:   Physical Exam  Constitutional: She is oriented to person, place, and time. She appears well-developed and well-nourished.  Morbid obesity  HENT:  Head: Normocephalic and atraumatic.  Right Ear: External ear normal.  Left Ear: External ear normal.  Mouth/Throat: Oropharynx is clear and moist.  Eyes: Conjunctivae and EOM are normal. Pupils are equal, round, and reactive to light.  Neck: Normal range of motion. Neck supple.  Cardiovascular: Normal rate, regular rhythm, normal heart sounds, intact distal pulses and normal pulses.   Pulses:      Carotid pulses are 2+ on the right side, and 2+ on the left side.      Radial pulses are 2+ on the right side, and 2+ on the left side.       Femoral pulses are 2+ on the right side, and 2+ on the left side.       Popliteal pulses are 2+ on the right side, and 2+ on the left side.       Dorsalis pedis pulses are 2+ on the right side, and 2+ on the left side.       Posterior tibial pulses are 2+ on the right side, and 2+ on the left side.  Pulmonary/Chest: Effort normal and breath sounds normal.  Abdominal: Soft. Bowel sounds are normal.  Musculoskeletal: Normal range of motion.  Neurological: She is alert and oriented to person, place, and time. She has normal reflexes.  Skin: Skin is warm and dry.  Psychiatric: Her behavior is normal. Judgment and thought content normal. Her mood appears anxious. She exhibits a depressed mood.       BP 122/82 mmHg  Pulse 92  Temp(Src) 98.4 F (36.9 C) (Oral)  Resp 16  Ht  (1.575 m)  Wt 240 lb (108.863 kg)  BMI 43.89 kg/m2  SpO2 96%  LMP 07/22/2015 Assessment & Plan:   1. Generalized anxiety disorder - busPIRone (BUSPAR) 5 MG tablet; Take 1 tablet (5 mg total) by mouth 2 (two) times daily.  Dispense: 60 tablet; Refill: 1 - ALPRAZolam (XANAX) 0.25 MG tablet; Take 1 tablet (0.25 mg total) by mouth 2 (two) times daily as needed for anxiety.  Dispense: 20 tablet; Refill: 0   GAD 7 : Generalized Anxiety Score 07/23/2015  Nervous, Anxious, on Edge 2  Control/stop worrying 2  Worry too much - different things 2  Trouble relaxing 3  Restless 2  Easily annoyed or irritable 2  Afraid - awful might happen 2  Total GAD 7 Score 15  Anxiety Difficulty Somewhat difficult     2. Neuropathy (HCC) Will continue gabapentin as previously prescribed by neurologist - Vitamin B12  3. Constipation, unspecified constipation type Discussed a high fiber diet and increasing water intake at length.  - docusate sodium (COLACE) 100 MG capsule; Take 1 capsule (100 mg total) by mouth daily.  Dispense: 30 capsule; Refill: 0  4. Convulsions, unspecified convulsion type (HCC) Last seizure activity was on yesterday. Reviewed previous notes   5. Chronic pain  syndrome Previous referral to pain management  6. Heart palpitations  - EKG 12-Lead  7. Seizure disorder [facility]) Patient to follow up with neurology as scheduled  8. Obstructive sleep apnea Reviewed results of sleep study. Patient awaiting CPAP fitting.   RTC: 1 month for GAD    Hollis,[name], FNP   The patient was given clear instructions to go to ER or return to medical center if symptoms do not improve, worsen or new problems develop. The patient verbalized understanding. Will notify patient with laboratory results.

## 2015-07-23 NOTE — Patient Instructions (Signed)
Generalized Anxiety Disorder Generalized anxiety disorder (GAD) is a mental disorder. It interferes with life functions, including relationships, work, and school. GAD is different from normal anxiety, which everyone experiences at some point in their lives in response to specific life events and activities. Normal anxiety actually helps us prepare for and get through these life events and activities. Normal anxiety goes away after the event or activity is over.  GAD causes anxiety that is not necessarily related to specific events or activities. It also causes excess anxiety in proportion to specific events or activities. The anxiety associated with GAD is also difficult to control. GAD can vary from mild to severe. People with severe GAD can have intense waves of anxiety with physical symptoms (panic attacks).  SYMPTOMS The anxiety and worry associated with GAD are difficult to control. This anxiety and worry are related to many life events and activities and also occur more days than not for 6 months or longer. People with GAD also have three or more of the following symptoms (one or more in children):  Restlessness.   Fatigue.  Difficulty concentrating.   Irritability.  Muscle tension.  Difficulty sleeping or unsatisfying sleep. DIAGNOSIS GAD is diagnosed through an assessment by your health care provider. Your health care provider will ask you questions aboutyour mood,physical symptoms, and events in your life. Your health care provider may ask you about your medical history and use of alcohol or drugs, including prescription medicines. Your health care provider may also do a physical exam and blood tests. Certain medical conditions and the use of certain substances can cause symptoms similar to those associated with GAD. Your health care provider may refer you to a mental health specialist for further evaluation. TREATMENT The following therapies are usually used to treat GAD:    Medication. Antidepressant medication usually is prescribed for long-term daily control. Antianxiety medicines may be added in severe cases, especially when panic attacks occur.   Talk therapy (psychotherapy). Certain types of talk therapy can be helpful in treating GAD by providing support, education, and guidance. A form of talk therapy called cognitive behavioral therapy can teach you healthy ways to think about and react to daily life events and activities.  Stress managementtechniques. These include yoga, meditation, and exercise and can be very helpful when they are practiced regularly. A mental health specialist can help determine which treatment is best for you. Some people see improvement with one therapy. However, other people require a combination of therapies.   This information is not intended to replace advice given to you by your health care provider. Make sure you discuss any questions you have with your health care provider.   Document Released: 06/11/2012 Document Revised: 03/07/2014 Document Reviewed: 06/11/2012 Elsevier Interactive Patient Education 2016 [employer]  

## 2015-07-28 ENCOUNTER — Encounter: Payer: Self-pay | Admitting: Family Medicine

## 2015-07-29 ENCOUNTER — Telehealth: Payer: Self-pay | Admitting: Family Medicine

## 2015-07-29 ENCOUNTER — Encounter: Payer: Self-pay | Admitting: Neurology

## 2015-07-29 DIAGNOSIS — G894 Chronic pain syndrome: Secondary | ICD-10-CM

## 2015-07-29 DIAGNOSIS — R569 Unspecified convulsions: Secondary | ICD-10-CM

## 2015-07-29 DIAGNOSIS — G629 Polyneuropathy, unspecified: Secondary | ICD-10-CM

## 2015-07-29 MED ORDER — GABAPENTIN 300 MG PO CAPS: ORAL_CAPSULE | ORAL | Status: DC

## 2015-07-29 NOTE — Telephone Encounter (Signed)
Called patient after she sent my chart msg this morning stating she had seizure & nausea. Patient states she had a seizure this am, doesn't know how long she was seizing states she woke up having seizure. States she has vomited 3-4 times since seizure. She also c/o dizziness, headache, & weakness. States she has been taking her meds daily & hasn't missed any doses. She is resting now.

## 2015-07-29 NOTE — Telephone Encounter (Signed)
Spoke with patient she is agreeable to increase of Gabapentin. Will send in new Rx to her pharmacy to reflect change.

## 2015-07-29 NOTE — Telephone Encounter (Signed)
She can increase gabapentin to 600 mg tid (I know that she is on this for HA as well)

## 2015-07-31 ENCOUNTER — Encounter (HOSPITAL_COMMUNITY): Payer: Self-pay | Admitting: *Deleted

## 2015-07-31 ENCOUNTER — Emergency Department (HOSPITAL_COMMUNITY): Payer: Medicaid Other

## 2015-07-31 ENCOUNTER — Emergency Department (HOSPITAL_COMMUNITY)
Admission: EM | Admit: 2015-07-31 | Discharge: 2015-07-31 | Disposition: A | Discharge status: Home / Self Care | Payer: Medicaid Other | Attending: Emergency Medicine | Admitting: Emergency Medicine

## 2015-07-31 ENCOUNTER — Encounter: Payer: Self-pay | Admitting: Family Medicine

## 2015-07-31 DIAGNOSIS — F17210 Nicotine dependence, cigarettes, uncomplicated: Secondary | ICD-10-CM | POA: Diagnosis not present

## 2015-07-31 DIAGNOSIS — I319 Disease of pericardium, unspecified: Secondary | ICD-10-CM | POA: Diagnosis not present

## 2015-07-31 DIAGNOSIS — R072 Precordial pain: Secondary | ICD-10-CM

## 2015-07-31 DIAGNOSIS — I252 Old myocardial infarction: Secondary | ICD-10-CM | POA: Diagnosis not present

## 2015-07-31 DIAGNOSIS — Z8673 Personal history of transient ischemic attack (TIA), and cerebral infarction without residual deficits: Secondary | ICD-10-CM | POA: Insufficient documentation

## 2015-07-31 DIAGNOSIS — R0602 Shortness of breath: Secondary | ICD-10-CM | POA: Diagnosis present

## 2015-07-31 LAB — BASIC METABOLIC PANEL
Anion gap: 7 (ref 5–15)
BUN: 10 mg/dL (ref 6–20)
CHLORIDE: 108 mmol/L (ref 101–111)
CO2: 25 mmol/L (ref 22–32)
CREATININE: 0.86 mg/dL (ref 0.44–1.00)
Calcium: 9.5 mg/dL (ref 8.9–10.3)
GFR calc Af Amer: >60 mL/min (ref >60)
GFR calc non Af Amer: >60 mL/min (ref >60)
GLUCOSE: 104 mg/dL — AB (ref 65–99)
Potassium: 3.9 mmol/L (ref 3.5–5.1)
SODIUM: 140 mmol/L (ref 135–145)

## 2015-07-31 LAB — BRAIN NATRIURETIC PEPTIDE: B NATRIURETIC PEPTIDE 5: 9.3 pg/mL (ref 0.0–100.0)

## 2015-07-31 LAB — CBC
HEMATOCRIT: 41 % (ref 36.0–46.0)
Hemoglobin: 13.5 g/dL (ref 12.0–15.0)
MCH: 30.3 pg (ref 26.0–34.0)
MCHC: 32.9 g/dL (ref 30.0–36.0)
MCV: 92.1 fL (ref 78.0–100.0)
PLATELETS: 208 10*3/uL (ref 150–400)
RBC: 4.45 MIL/uL (ref 3.87–5.11)
RDW: 13.4 % (ref 11.5–15.5)
WBC: 6.2 10*3/uL (ref 4.0–10.5)

## 2015-07-31 LAB — TROPONIN I
Troponin I: <0.03 ng/mL (ref <0.031)
Troponin I: <0.03 ng/mL (ref <0.031)

## 2015-07-31 LAB — D-DIMER, QUANTITATIVE: D-Dimer, Quant: 0.48 ug/mL-FEU (ref 0.00–0.50)

## 2015-07-31 LAB — I-STAT BETA HCG BLOOD, ED (MC, WL, AP ONLY): I-stat hCG, quantitative: <5 m[IU]/mL (ref <5)

## 2015-07-31 MED ORDER — ASPIRIN 81 MG PO CHEW
324.0000 mg | CHEWABLE_TABLET | Freq: Once | ORAL | Status: AC
  Administered 2015-07-31: 324 mg via ORAL
  Filled 2015-07-31: qty 4

## 2015-07-31 MED ORDER — KETOROLAC TROMETHAMINE 15 MG/ML IJ SOLN
15.0000 mg | Freq: Once | INTRAMUSCULAR | Status: AC
  Administered 2015-07-31: 15 mg via INTRAVENOUS
  Filled 2015-07-31: qty 1

## 2015-07-31 MED ORDER — NITROGLYCERIN 0.4 MG SL SUBL
0.4000 mg | SUBLINGUAL_TABLET | SUBLINGUAL | Status: DC | PRN
  Administered 2015-07-31 (×2): 0.4 mg via SUBLINGUAL
  Filled 2015-07-31: qty 1

## 2015-07-31 MED ORDER — IBUPROFEN 600 MG PO TABS: 600.0000 mg | ORAL_TABLET | Freq: Four times a day (QID) | ORAL | Status: DC | PRN

## 2015-07-31 MED ORDER — PREDNISONE 10 MG PO TABS: 60.0000 mg | ORAL_TABLET | Freq: Every day | ORAL | Status: DC

## 2015-07-31 NOTE — Discharge Instructions (Signed)
We saw you in the ER for the chest pain/shortness of breath. All of our cardiac workup is normal, including labs, EKG and chest X-RAY are normal. We are not sure what is causing your discomfort, but we feel comfortable sending you home at this time. The workup in the ER is not complete, and you should follow up with your primary care doctor for further evaluation.  Please return to the ER if you have worsening chest pain, shortness of breath, pain radiating to your jaw, shoulder, or back, sweats or fainting. Otherwise see the Cardiologist or your primary care doctor as requested.   Pericarditis Pericarditis is swelling (inflammation) of the pericardium. The pericardium is a thin, double-layered, fluid-filled tissue sac that surrounds the heart. The purpose of the pericardium is to contain the heart in the chest cavity and keep the heart from overexpanding. Different types of pericarditis can occur, such as:  Acute pericarditis. Inflammation can develop suddenly in acute pericarditis.  Chronic pericarditis. Inflammation develops gradually and is long-lasting in chronic pericarditis.  Constrictive pericarditis. In this type of pericarditis, the layers of the pericardium stiffen and develop scar tissue. The scar tissue thickens and sticks together. This makes it difficult for the heart to pump and work as it normally does. CAUSES  Pericarditis can be caused from different conditions, such as:  A bacterial, fungal or viral infection.  After a heart attack (myocardial infarction).  After open-heart surgery (coronary bypass graft surgery).  Auto-immune conditions such as lupus, rheumatoid arthritis or scleroderma.  Kidney failure.  Low thyroid condition (hypothyroidism).  Cancer from another part of the body that has spread (metastasized) to the pericardium.  Chest injury or trauma.  After radiation treatment.  Certain medicines. SYMPTOMS  Symptoms of pericarditis can  include:  Chest pain. Chest pain symptoms may increase when laying down and may be relieved when sitting up and leaning forward.  A chronic, dry cough.  Heart palpitations. These may feel like rapid, fluttering or pounding heart beats.  Chest pain may be worse when swallowing.  Dizziness or fainting.  Tiredness, fatigue or lethargy.  Fever. DIAGNOSIS  Pericarditis is diagnosed by the following:  A physical exam. A heart sound called a pericardial friction rub may be heard when your caregiver listens to your heart.  Blood work. Blood may be drawn to check for an infection and to look at your blood chemistry.  Electrocardiography. During electrocardiography your heart's electrical activity is monitored and recorded with a tracing on paper (electrocardiogram [ECG]).  Echocardiography.  Computed tomography (CT).  Magnetic resonance image (MRI). TREATMENT  To treat pericarditis, it is important to know the cause of it. The cause of pericarditis determines the treatment.   If the cause of pericarditis is due to an infection, treatment is based on the type of infection. If an infection is suspected in the pericardial fluid, a procedure called a pericardial fluid culture and biopsy may be done. This takes a sample of the pericardial fluid. The sample is sent to a lab which runs tests on the pericardial fluid to check for an infection.  If the autoimmune disease is the cause, treatment of the autoimmune condition will help improve the pericarditis.  If the cause of pericarditis is not known, anti-inflammatory medicines may be used to help decrease the inflammation.  Surgery may be needed. The following are types of surgeries or procedures that may be done to treat pericarditis:  Pericardial window. A pericardial window makes a cut (incision) into the pericardial sac.  This allows excess fluid in the pericardium to drain.  Pericardiocentesis. A pericardiocentesis is also known as a  pericardial tap. This procedure uses a needle that is guided by X-ray to drain (aspirate) excess fluid from the pericardium.  Pericardiectomy. A pericardiectomy removes part or all of the pericardium. HOME CARE INSTRUCTIONS   Do not smoke. If you smoke, quit. Your caregiver can help you quit smoking.  Maintain a healthy weight.  Follow an exercise program as directed by your health care provider. You may need to limit your exercising until your symptoms go away.  If you drink alcohol, do so in moderation.  Eat a heart healthy diet. A registered dietitian can help you learn about healthy food choices.  Keep a list of all your medicines with you at all times. Include the name, dose, how often it is taken and how it is taken. SEEK IMMEDIATE MEDICAL CARE IF:   You have chest pain or feelings of chest pressure.  You have sweating (diaphoresis) when at rest.  You have irregular heartbeats (palpitations).  You have rapid, racing heart beats.  You have unexplained fainting episodes.  You feel sick to your stomach (nausea) or vomiting without cause.  You have unexplained weakness. If you develop any of the symptoms which originally made you seek care, call for local emergency medical help. Do not drive yourself to the hospital.   This information is not intended to replace advice given to you by your health care provider. Make sure you discuss any questions you have with your health care provider.   Document Released: 08/10/2000 Document Revised: 07/01/2014 Document Reviewed: 08/27/2014 Elsevier Interactive Patient Education 2016 Else[employer] Inc[employer]  Nonspecific Chest Pain  Chest pain can be caused by many different conditions. There is always a chance that your pain could be related to something serious, such as a heart attack or a blood clot in your lungs. Chest pain can also be caused by conditions that are not life-threatening. If you have chest pain, it is very important to follow up  with your health care provider. CAUSES  Chest pain can be caused by:  Heartburn.  Pneumonia or bronchitis.  Anxiety or stress.  Inflammation around your heart (pericarditis) or lung (pleuritis or pleurisy).  A blood clot in your lung.  A collapsed lung (pneumothorax). It can develop suddenly on its own (spontaneous pneumothorax) or from trauma to the chest.  Shingles infection (varicella-zoster virus).  Heart attack.  Damage to the bones, muscles, and cartilage that make up your chest wall. This can include:  Bruised bones due to injury.  Strained muscles or cartilage due to frequent or repeated coughing or overwork.  Fracture to one or more ribs.  Sore cartilage due to inflammation (costochondritis). RISK FACTORS  Risk factors for chest pain may include:  Activities that increase your risk for trauma or injury to your chest.  Respiratory infections or conditions that cause frequent coughing.  Medical conditions or overeating that can cause heartburn.  Heart disease or family history of heart disease.  Conditions or health behaviors that increase your risk of developing a blood clot.  Having had chicken pox (varicella zoster). SIGNS AND SYMPTOMS Chest pain can feel like:  Burning or tingling on the surface of your chest or deep in your chest.  Crushing, pressure, aching, or squeezing pain.  Dull or sharp pain that is worse when you move, cough, or take a deep breath.  Pain that is also felt in your back, neck, shoulder, or  arm, or pain that spreads to any of these areas. Your chest pain may come and go, or it may stay constant. DIAGNOSIS Lab tests or other studies may be needed to find the cause of your pain. Your health care provider may have you take a test called an ambulatory ECG (electrocardiogram). An ECG records your heartbeat patterns at the time the test is performed. You may also have other tests, such as:  Transthoracic echocardiogram (TTE). During  echocardiography, sound waves are used to create a picture of all of the heart structures and to look at how blood flows through your heart.  Transesophageal echocardiogram (TEE).This is a more advanced imaging test that obtains images from inside your body. It allows your health care provider to see your heart in finer detail.  Cardiac monitoring. This allows your health care provider to monitor your heart rate and rhythm in real time.  Holter monitor. This is a portable device that records your heartbeat and can help to diagnose abnormal heartbeats. It allows your health care provider to track your heart activity for several days, if needed.  Stress tests. These can be done through exercise or by taking medicine that makes your heart beat more quickly.  Blood tests.  Imaging tests. TREATMENT  Your treatment depends on what is causing your chest pain. Treatment may include:  Medicines. These may include:  Acid blockers for heartburn.  Anti-inflammatory medicine.  Pain medicine for inflammatory conditions.  Antibiotic medicine, if an infection is present.  Medicines to dissolve blood clots.  Medicines to treat coronary artery disease.  Supportive care for conditions that do not require medicines. This may include:  Resting.  Applying heat or cold packs to injured areas.  Limiting activities until pain decreases. HOME CARE INSTRUCTIONS  If you were prescribed an antibiotic medicine, finish it all even if you start to feel better.  Avoid any activities that bring on chest pain.  Do not use any tobacco products, including cigarettes, chewing tobacco, or electronic cigarettes. If you need help quitting, ask your health care provider.  Do not drink alcohol.  Take medicines only as directed by your health care provider.  Keep all follow-up visits as directed by your health care provider. This is important. This includes any further testing if your chest pain does not go  away.  If heartburn is the cause for your chest pain, you may be told to keep your head raised (elevated) while sleeping. This reduces the chance that acid will go from your stomach into your esophagus.  Make lifestyle changes as directed by your health care provider. These may include:  Getting regular exercise. Ask your health care provider to suggest some activities that are safe for you.  Eating a heart-healthy diet. A registered dietitian can help you to learn healthy eating options.  Maintaining a healthy weight.  Managing diabetes, if necessary.  Reducing stress. SEEK MEDICAL CARE IF:  Your chest pain does not go away after treatment.  You have a rash with blisters on your chest.  You have a fever. SEEK IMMEDIATE MEDICAL CARE IF:   Your chest pain is worse.  You have an increasing cough, or you cough up blood.  You have severe abdominal pain.  You have severe weakness.  You faint.  You have chills.  You have sudden, unexplained chest discomfort.  You have sudden, unexplained discomfort in your arms, back, neck, or jaw.  You have shortness of breath at any time.  You suddenly start to  sweat, or your skin gets clammy.  You feel nauseous or you vomit.  You suddenly feel light-headed or dizzy.  Your heart begins to beat quickly, or it feels like it is skipping beats. These symptoms may represent a serious problem that is an emergency. Do not wait to see if the symptoms will go away. Get medical help right away. Call your local emergency services (911 in the [place] Do not drive yourself to the hospital.   This information is not intended to replace advice given to you by your health care provider. Make sure you discuss any questions you have with your health care provider.   Document Released: 11/24/2004 Document Revised: 03/07/2014 Document Reviewed: 09/20/2013 Elsevier Interactive Patient Education [employer]2016 Elsevier Inc.

## 2015-07-31 NOTE — ED Notes (Signed)
Pt states chest pain, LE edema and sob x 2 days.  Hx of MI and stroke.  States recent seizure activity as well.

## 2015-07-31 NOTE — ED Notes (Signed)
Pt transported to xray 

## 2015-07-31 NOTE — ED Notes (Signed)
Pt ambulated in the hallway with no assistance. Pt stated that the only discomfort she was feeling was her throat and chest whenever she burped. Pt had no other complaints. Pt O2 was 99% while pulse stayed in the low 80s.

## 2015-07-31 NOTE — ED Provider Notes (Signed)
CSN: [id number]     Arrival date & time 07/31/15  1056 History   First MD Initiated Contact with Patient 07/31/15 1131     Chief Complaint  Patient presents with  . Shortness of Breath  . Chest Pain     (Consider location/radiation/quality/duration/timing/severity/associated sxs/prior Treatment) HPI Comments: Pt comes in with cc of chest pain. She reports hx of MI - but denies any stent or interventions. She also reports stroke hx, but has no side effects and unsure if she had a TIA. Pt has been having chest pain x 2 days. Pain is constant, and is located in her mid-chest. She reports that the pain is sharp, worse with inspiration and when she lays flat. She also thinks that the pain is better sitting up. The pain is not exertional. Pain is worse with palpation of her chest. Pt denies smoking hx, drug use. She denies CAD at young age in her immediate family. She reports that her MI was 2 years ago, she had chest pain, and she was in [place] at that time.   ROS 10 Systems reviewed and are negative for acute change except as noted in the HPI.     Patient is a 30 y.o. female presenting with shortness of breath and chest pain. The history is provided by the patient.  Shortness of Breath Associated symptoms: chest pain   Chest Pain Associated symptoms: shortness of breath     Past Medical History  Diagnosis Date  . Seizures (HCC)   . Neuropathy (HCC)   . Epilepsy (HCC)   . Neuropathy (HCC)   . Headache   . Vertigo   . Stroke [facility](HCC)     tia   Past Surgical History  Procedure Laterality Date  . Cesarean section  2011  . Ovarian cyst surgery     Family History  Problem Relation Age of Onset  . Neuropathy Mother   . Heart disease Maternal Grandmother   . Diabetes Maternal Grandmother    Social History  Substance Use Topics  . Smoking status: Current Every Day Smoker -- 0.25 packs/day    Types: Cigarettes  . Smokeless tobacco: Never Used  . Alcohol Use: 0.0 oz/week    0  Standard drinks or equivalent per week     Comment: occ   OB History    Gravida Para Term Preterm AB TAB SAB Ectopic Multiple Living   2 1 1  1     1      Review of Systems  Respiratory: Positive for shortness of breath.   Cardiovascular: Positive for chest pain.      Allergies  Pineapple  Home Medications   Prior to Admission medications   Medication Sig Start Date End Date Taking? Authorizing Provider  acetaminophen (TYLENOL) 500 MG tablet Take 1 tablet (500 mg total) by mouth every 6 (six) hours as needed. Patient not taking: Reported on 06/21/2015 05/26/15   Everlene FarrierWilliam Dansie, PA-C  ALPRAZolam Prudy Feeler(XANAX) 0.25 MG tablet Take 1 tablet (0.25 mg total) by mouth 2 (two) times daily as needed for anxiety. 07/23/15   Massie MaroonLachina M Hollis, FNP  busPIRone (BUSPAR) 5 MG tablet Take 1 tablet (5 mg total) by mouth 2 (two) times daily. 07/23/15   Massie MaroonLachina M Hollis, FNP  butalbital-acetaminophen-caffeine (FIORICET) [phone] MG tablet Take 1-2 tablets by mouth every 6 (six) hours as needed for headache. Patient not taking: Reported on 07/23/2015 06/21/15 06/20/16  Santiago GladHeather Laisure, PA-C  docusate sodium (COLACE) 100 MG capsule Take 1 capsule (100 mg  total) by mouth daily. 07/23/15   Massie Maroon, FNP  furosemide (LASIX) 20 MG tablet TAKE 1 TABLET(20 MG) BY MOUTH DAILY 06/29/15   Massie Maroon, FNP  gabapentin (NEURONTIN) 300 MG capsule Take 2 caps three times daily 07/29/15   Octaviano Batty Tat, DO  Lacosamide (VIMPAT) 100 MG TABS Take 1 tablet (100 mg total) by mouth 2 (two) times daily. 06/08/15   Van Clines, MD  levETIRAcetam (KEPPRA) 500 MG tablet Take 3 tablets (1,500 mg total) by mouth 2 (two) times daily. 06/08/15   Van Clines, MD  meclizine (ANTIVERT) 12.5 MG tablet Take 1 tablet (12.5 mg total) by mouth 3 (three) times daily as needed for dizziness. 06/02/15   Massie Maroon, FNP  naproxen (NAPROSYN) 500 MG tablet Take 1 tablet (500 mg total) by mouth 2 (two) times daily. Patient not taking:  Reported on 06/21/2015 05/16/15   Janne Napoleon, NP  sertraline (ZOLOFT) 100 MG tablet Take 1 tablet (100 mg total) by mouth daily. 05/28/15   Massie Maroon, FNP   BP 113/71 mmHg  Pulse 83  Temp(Src) 98.3 F (36.8 C) (Oral)  Resp 23  Ht  (1.575 m)  Wt 241 lb 8 oz (109.544 kg)  BMI 44.16 kg/m2  SpO2 99%  LMP 07/22/2015 Physical Exam  Constitutional: She is oriented to person, place, and time. She appears well-developed.  HENT:  Head: Normocephalic and atraumatic.  Eyes: EOM are normal.  Neck: Normal range of motion. Neck supple. No JVD present.  Cardiovascular: Normal rate and intact distal pulses.   No murmur heard. Pulmonary/Chest: Effort normal. No respiratory distress. She has no wheezes. She has no rales.  Abdominal: Bowel sounds are normal. There is no tenderness.  Neurological: She is alert and oriented to person, place, and time.  Skin: Skin is warm and dry.  Nursing note and vitals reviewed.   ED Course  Procedures (including critical care time)  3:15 Repeat exam reveals chest pain that has improved significantly since the toradol. Patient is not in any respiratory distress nor is there hypoxia. She was ambulated and her O2 sats were in the 90s with Hr in the 80s. Pain is pleuritic, positional, and wasn't exertional. Trops x 2 neg and dimer is neg. Presume pericarditis, although EKG is not showing classic signs of it. Strict ER return precautions have been discussed, and patient is agreeing with the plan and is comfortable with the workup done and the recommendations from the ER.    Labs Review Labs Reviewed  BASIC METABOLIC PANEL - Abnormal; Notable for the following:    Glucose, Bld 104 (*)    All other components within normal limits  CBC  TROPONIN I  TROPONIN I  D-DIMER, QUANTITATIVE (NOT AT [facility])  BRAIN NATRIURETIC PEPTIDE  I-STAT BETA HCG BLOOD, ED ([facility], [facility], [facility] ONLY)    Imaging Review Dg Chest 2 View  07/31/2015  CLINICAL DATA:  Left chest pain,  shortness of breath EXAM: CHEST  2 VIEW COMPARISON:  None. FINDINGS: Lungs are essentially clear. Right hemidiaphragm is mildly obscured on the frontal radiograph but appears clear on the lateral view. No pleural effusion or pneumothorax. The heart is top-normal in size. Visualized osseous structures are within normal limits. IMPRESSION: No evidence of acute cardiopulmonary disease. Electronically Signed   By: Charline Bills M.D.   On: 07/31/2015 12:25   I have personally reviewed and evaluated these images and lab results as part of my medical decision-making.  EKG Interpretation None      MDM   Final diagnoses:  Pericarditis  Precordial pain    Pt comes in with cc of chest pain. Chest pain is atypical - 2 days, constant, positional, pleuritic, worse with palpation. ACS concerns are low. She reports hx of MI - but denies any stents, catheterization - so i doubt that she had a STEMI. HEAR score is 0. Dimer will be ordered, given some pleurisy. BNP ordered as she reports some orthopnea like symptoms. Pt has no hx of PE, DVT and denies any exogenous estrogen use, long distance travels or surgery in the past 6 weeks, active cancer, recent immobilization.    4:06 PM Dimer, trops, bnp neg.  Will tx as pericarditis.    Derwood Kaplan, MD 07/31/15 [phone]

## 2015-07-31 NOTE — ED Notes (Signed)
Pt reports pain with inspiration, is taking shallow breaths with sat at 78% on room air.  O2 applied via nasal cannula at 2 liters with sats increasing to 84%.

## 2015-08-04 ENCOUNTER — Emergency Department (HOSPITAL_COMMUNITY): Payer: Medicaid Other

## 2015-08-04 ENCOUNTER — Ambulatory Visit: Payer: Self-pay | Admitting: Certified Nurse Midwife

## 2015-08-04 ENCOUNTER — Emergency Department (HOSPITAL_COMMUNITY)
Admission: EM | Admit: 2015-08-04 | Discharge: 2015-08-04 | Disposition: A | Discharge status: Home / Self Care | Payer: Medicaid Other | Attending: Emergency Medicine | Admitting: Emergency Medicine

## 2015-08-04 ENCOUNTER — Encounter (HOSPITAL_COMMUNITY): Payer: Self-pay | Admitting: Emergency Medicine

## 2015-08-04 DIAGNOSIS — Z8669 Personal history of other diseases of the nervous system and sense organs: Secondary | ICD-10-CM | POA: Diagnosis not present

## 2015-08-04 DIAGNOSIS — B373 Candidiasis of vulva and vagina: Secondary | ICD-10-CM | POA: Diagnosis not present

## 2015-08-04 DIAGNOSIS — B9689 Other specified bacterial agents as the cause of diseases classified elsewhere: Secondary | ICD-10-CM

## 2015-08-04 DIAGNOSIS — Z791 Long term (current) use of non-steroidal anti-inflammatories (NSAID): Secondary | ICD-10-CM | POA: Insufficient documentation

## 2015-08-04 DIAGNOSIS — R102 Pelvic and perineal pain: Secondary | ICD-10-CM | POA: Insufficient documentation

## 2015-08-04 DIAGNOSIS — Z79899 Other long term (current) drug therapy: Secondary | ICD-10-CM | POA: Diagnosis not present

## 2015-08-04 DIAGNOSIS — N760 Acute vaginitis: Secondary | ICD-10-CM | POA: Insufficient documentation

## 2015-08-04 DIAGNOSIS — F17210 Nicotine dependence, cigarettes, uncomplicated: Secondary | ICD-10-CM | POA: Insufficient documentation

## 2015-08-04 DIAGNOSIS — Z8673 Personal history of transient ischemic attack (TIA), and cerebral infarction without residual deficits: Secondary | ICD-10-CM | POA: Diagnosis not present

## 2015-08-04 DIAGNOSIS — B3731 Acute candidiasis of vulva and vagina: Secondary | ICD-10-CM

## 2015-08-04 DIAGNOSIS — R1030 Lower abdominal pain, unspecified: Secondary | ICD-10-CM | POA: Diagnosis present

## 2015-08-04 LAB — I-STAT BETA HCG BLOOD, ED (MC, WL, AP ONLY): I-stat hCG, quantitative: <5 m[IU]/mL (ref <5)

## 2015-08-04 LAB — URINALYSIS, ROUTINE W REFLEX MICROSCOPIC
Bilirubin Urine: NEGATIVE
GLUCOSE, UA: NEGATIVE mg/dL
HGB URINE DIPSTICK: NEGATIVE
KETONES UR: NEGATIVE mg/dL
Nitrite: NEGATIVE
PROTEIN: NEGATIVE mg/dL
Specific Gravity, Urine: 1.016 (ref 1.005–1.030)
pH: 5.5 (ref 5.0–8.0)

## 2015-08-04 LAB — COMPREHENSIVE METABOLIC PANEL
ALT: 29 U/L (ref 14–54)
AST: 22 U/L (ref 15–41)
Albumin: 4.3 g/dL (ref 3.5–5.0)
Alkaline Phosphatase: 66 U/L (ref 38–126)
Anion gap: 6 (ref 5–15)
BUN: 11 mg/dL (ref 6–20)
CHLORIDE: 109 mmol/L (ref 101–111)
CO2: 24 mmol/L (ref 22–32)
CREATININE: 0.85 mg/dL (ref 0.44–1.00)
Calcium: 9.1 mg/dL (ref 8.9–10.3)
GFR calc Af Amer: >60 mL/min (ref >60)
GFR calc non Af Amer: >60 mL/min (ref >60)
Glucose, Bld: 98 mg/dL (ref 65–99)
Potassium: 3.6 mmol/L (ref 3.5–5.1)
SODIUM: 139 mmol/L (ref 135–145)
Total Bilirubin: 0.6 mg/dL (ref 0.3–1.2)
Total Protein: 7.7 g/dL (ref 6.5–8.1)

## 2015-08-04 LAB — CBC
HCT: 42.3 % (ref 36.0–46.0)
Hemoglobin: 14.3 g/dL (ref 12.0–15.0)
MCH: 31.1 pg (ref 26.0–34.0)
MCHC: 33.8 g/dL (ref 30.0–36.0)
MCV: 92 fL (ref 78.0–100.0)
PLATELETS: 226 10*3/uL (ref 150–400)
RBC: 4.6 MIL/uL (ref 3.87–5.11)
RDW: 13.2 % (ref 11.5–15.5)
WBC: 9 10*3/uL (ref 4.0–10.5)

## 2015-08-04 LAB — URINE MICROSCOPIC-ADD ON: RBC / HPF: NONE SEEN RBC/hpf (ref 0–5)

## 2015-08-04 LAB — LIPASE, BLOOD: LIPASE: 18 U/L (ref 11–51)

## 2015-08-04 LAB — WET PREP, GENITAL
SPERM: NONE SEEN
TRICH WET PREP: NONE SEEN

## 2015-08-04 MED ORDER — FLUCONAZOLE 150 MG PO TABS: 150.0000 mg | ORAL_TABLET | Freq: Every day | ORAL | Status: AC

## 2015-08-04 MED ORDER — KETOROLAC TROMETHAMINE 60 MG/2ML IM SOLN
60.0000 mg | Freq: Once | INTRAMUSCULAR | Status: AC
  Administered 2015-08-04: 60 mg via INTRAMUSCULAR
  Filled 2015-08-04: qty 2

## 2015-08-04 MED ORDER — METRONIDAZOLE 500 MG PO TABS: 500.0000 mg | ORAL_TABLET | Freq: Two times a day (BID) | ORAL | Status: DC

## 2015-08-04 NOTE — ED Notes (Signed)
Per EMS-pt called EMS with c/o lower abdominal pain, radiating to pelvic area x 1 day. C/o sharp pain on urination. Reports NV x 1 yesterday. Reports IUD in place. Pt is alert, oriented and ambulatory

## 2015-08-04 NOTE — Discharge Instructions (Signed)
Read the information below.   You have bacterial vaginosis and yeast. You are being prescribed an antibiotic and antifungal for treatment. Take full course. The gonorrhea and chlamydia will come back at a later date. If there is any abnormality, you will be notified.  Your pelvic ultrasound did not have a great visualization of IUD; however, no acute pathology noted.  Use the prescribed medication as directed.  Please discuss all new medications with your pharmacist.   Be sure to call and schedule a follow up appointment with [facility] for re-evaluation and discussion of possible alternative contraceptive methods.  You can take ibuprofen or naprosyn at home for pain relief.  You may return to the Emergency Department at any time for worsening condition or any new symptoms that concern you. Return to ED if you develop fever, focal abdominal pain, inability to keep fluids down, blood in urine, or blood in stool.    Abdominal Pain, Adult Many things can cause belly (abdominal) pain. Most times, the belly pain is not dangerous. Many cases of belly pain can be watched and treated at home. HOME CARE   Do not take medicines that help you go poop (laxatives) unless told to by your doctor.  Only take medicine as told by your doctor.  Eat or drink as told by your doctor. Your doctor will tell you if you should be on a special diet. GET HELP IF:  You do not know what is causing your belly pain.  You have belly pain while you are sick to your stomach (nauseous) or have runny poop (diarrhea).  You have pain while you pee or poop.  Your belly pain wakes you up at night.  You have belly pain that gets worse or better when you eat.  You have belly pain that gets worse when you eat fatty foods.  You have a fever. GET HELP RIGHT AWAY IF:   The pain does not go away within 2 hours.  You keep throwing up (vomiting).  The pain changes and is only in the right or left part of the belly.  You  have bloody or tarry looking poop. MAKE SURE YOU:   Understand these instructions.  Will watch your condition.  Will get help right away if you are not doing well or get worse.   This information is not intended to replace advice given to you by your health care provider. Make sure you discuss any questions you have with your health care provider.   Document Released: 08/03/2007 Document Revised: 03/07/2014 Document Reviewed: 10/24/2012 Elsevier Interactive Patient Education 2016 Elsevier Inc.   Bacterial Vaginosis Bacterial vaginosis is an infection of the vagina. It happens when too many germs (bacteria) grow in the vagina. Having this infection puts you at risk for getting other infections from sex. Treating this infection can help lower your risk for other infections, such as:   Chlamydia.  Gonorrhea.  HIV.  Herpes. HOME CARE  Take your medicine as told by your doctor.  Finish your medicine even if you start to feel better.  Tell your sex partner that you have an infection. They should see their doctor for treatment.  During treatment:  Avoid sex or use condoms correctly.  Do not douche.  Do not drink alcohol unless your doctor tells you it is ok.  Do not breastfeed unless your doctor tells you it is ok. GET HELP IF:  You are not getting better after 3 days of treatment.  You have more  grey fluid (discharge) coming from your vagina than before.  You have more pain than before.  You have a fever. MAKE SURE YOU:   Understand these instructions.  Will watch your condition.  Will get help right away if you are not doing well or get worse.   This information is not intended to replace advice given to you by your health care provider. Make sure you discuss any questions you have with your health care provider.   Document Released: 11/24/2007 Document Revised: 03/07/2014 Document Reviewed: 09/26/2012 Elsevier Interactive Patient Education 2016 Elsevier  Inc.  Pelvic Pain, Female Pelvic pain is pain felt below the belly button and between your hips. It can be caused by many different things. It is important to get help right away. This is especially true for severe, sharp, or unusual pain that comes on suddenly.  HOME CARE  Only take medicine as told by your doctor.  Rest as told by your doctor.  Eat a healthy diet, such as fruits, vegetables, and lean meats.  Drink enough fluids to keep your pee (urine) clear or pale yellow, or as told.  Avoid sex (intercourse) if it causes pain.  Apply warm or cold packs to your lower belly (abdomen). Use the type of pack that helps the pain.  Avoid situations that cause you stress.  Keep a journal to track your pain. Write down:  When the pain started.  Where it is located.  If there are things that seem to be related to the pain, such as food or your period.  Follow up with your doctor as told. GET HELP RIGHT AWAY IF:   You have heavy bleeding from the vagina.  You have more pelvic pain.  You feel lightheaded or pass out (faint).  You have chills.  You have pain when you pee or have blood in your pee.  You cannot stop having watery poop (diarrhea).  You cannot stop throwing up (vomiting).  You have a fever or lasting symptoms for more than 3 days.  You have a fever and your symptoms suddenly get worse.  You are being physically or sexually abused.  Your medicine does not help your pain.  You have fluid (discharge) coming from your vagina that is not normal. MAKE SURE YOU:  Understand these instructions.  Will watch your condition.  Will get help if you are not doing well or get worse.   This information is not intended to replace advice given to you by your health care provider. Make sure you discuss any questions you have with your health care provider.   Document Released: 08/03/2007 Document Revised: 03/07/2014 Document Reviewed: 06/06/2011 Elsevier Interactive  Patient Education [employer] Elsevier Inc.

## 2015-08-04 NOTE — ED Provider Notes (Signed)
CSN: [id number]     Arrival date & time 08/04/15  1509 History   First MD Initiated Contact with Patient 08/04/15 1614     Chief Complaint  Patient presents with  . Abdominal Pain    1 day hx of pain     (Consider location/radiation/quality/duration/timing/severity/associated sxs/prior Treatment) HPI Comments: Angela Doyle is a 30 y.o. Female G2P1011with history of epilepsy, depression, anxiety, and chronic pain presents to ED with complaint of pelvic pain. Patient states she has had intermittent pelvic pain for the last year following IUD placement. However, pain was worse today and constant prompting patient to come to ED. Pain is 10/10, sharp in nature, in the pelvic and suprapubic region. Denies vaginal pain, bleeding, discharge. No urinary symptoms. She had diffuse abdominal pain approximately 2 days ago with nausea and vomiting; however, pain now is in the suprapubic region. No repeat episodes of vomiting. No current nausea, diarrhea, constipation., Chills. No chest pain, shortness of breath, dizziness, lightheadedness. She is sexually active with 1 female partner in the last 6 months endorses using barrier contraceptive. History of chlamydia infection approximately 3 years ago. Has history of right ovarian cyst. She had her hymen open at age 30. No trauma to abdomen/pelvis. No change in physical activity.   Patient is a 30 y.o. female presenting with abdominal pain. The history is provided by the patient and medical records.  Abdominal Pain   Past Medical History  Diagnosis Date  . Seizures (HCC)   . Neuropathy (HCC)   . Epilepsy (HCC)   . Neuropathy (HCC)   . Headache   . Vertigo   . 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 Cesarean section  2011  . Ovarian cyst surgery     Family History  Problem Relation Age of Onset  . Neuropathy Mother   . Heart disease Maternal Grandmother   . Diabetes Maternal Grandmother    Social History   Substance Use Topics  . Smoking status: Current Every Day Smoker -- 0.25 packs/day    Types: Cigarettes  . Smokeless tobacco: Never Used  . Alcohol Use: 0.0 oz/week    0 Standard drinks or equivalent per week     Comment: occ   OB History    Gravida Para Term Preterm AB TAB SAB Ectopic Multiple Living   2 1 1  1     1      Review of Systems  Constitutional: Positive for diaphoresis.  Gastrointestinal: Positive for abdominal pain ( suprapubic).  Genitourinary: Positive for pelvic pain.  All other systems reviewed and are negative.     Allergies  Pineapple  Home Medications   Prior to Admission medications   Medication Sig Start Date End Date Taking? Authorizing Provider  ALPRAZolam (XANAX) 0.25 MG tablet Take 1 tablet (0.25 mg total) by mouth 2 (two) times daily as needed for anxiety. 07/23/15  Yes Massie MaroonLachina M Hollis, FNP  busPIRone (BUSPAR) 5 MG tablet Take 1 tablet (5 mg total) by mouth 2 (two) times daily. 07/23/15  Yes Massie MaroonLachina M Hollis, FNP  docusate sodium (COLACE) 100 MG capsule Take 1 capsule (100 mg total) by mouth daily. 07/23/15  Yes Massie MaroonLachina M Hollis, FNP  furosemide (LASIX) 20 MG tablet TAKE 1 TABLET(20 MG) BY MOUTH DAILY 06/29/15  Yes Massie MaroonLachina M Hollis, FNP  gabapentin (NEURONTIN) 300 MG capsule Take 2 caps three times daily 07/29/15  Yes Rebecca S Tat, DO  Lacosamide (VIMPAT) 100 MG  TABS Take 1 tablet (100 mg total) by mouth 2 (two) times daily. 06/08/15  Yes Van Clines, MD  levETIRAcetam (KEPPRA) 500 MG tablet Take 3 tablets (1,500 mg total) by mouth 2 (two) times daily. 06/08/15  Yes Van Clines, MD  meclizine (ANTIVERT) 12.5 MG tablet Take 1 tablet (12.5 mg total) by mouth 3 (three) times daily as needed for dizziness. 06/02/15  Yes Massie Maroon, FNP  predniSONE (DELTASONE) 10 MG tablet Take 6 tablets (60 mg total) by mouth daily. 07/31/15  Yes Derwood Kaplan, MD  sertraline (ZOLOFT) 100 MG tablet Take 1 tablet (100 mg total) by mouth daily. 05/28/15  Yes Massie Maroon, FNP  acetaminophen (TYLENOL) 500 MG tablet Take 1 tablet (500 mg total) by mouth every 6 (six) hours as needed. Patient not taking: Reported on 06/21/2015 05/26/15   Everlene Farrier, PA-C  butalbital-acetaminophen-caffeine (FIORICET) [id number] MG tablet Take 1-2 tablets by mouth every 6 (six) hours as needed for headache. Patient not taking: Reported on 07/23/2015 06/21/15 06/20/16  Santiago Glad, PA-C  fluconazole (DIFLUCAN) 150 MG tablet Take 1 tablet (150 mg total) by mouth daily. 08/04/15 08/11/15  Lona Kettle, PA-C  ibuprofen (ADVIL,MOTRIN) 600 MG tablet Take 1 tablet (600 mg total) by mouth every 6 (six) hours as needed. 07/31/15   Derwood Kaplan, MD  metroNIDAZOLE (FLAGYL) 500 MG tablet Take 1 tablet (500 mg total) by mouth 2 (two) times daily. 08/04/15   Lona Kettle, PA-C  naproxen (NAPROSYN) 500 MG tablet Take 1 tablet (500 mg total) by mouth 2 (two) times daily. Patient not taking: Reported on 06/21/2015 05/16/15   Janne Napoleon, NP   BP 105/58 mmHg  Pulse 74  Temp(Src) 98.3 F (36.8 C) (Oral)  Resp 16  SpO2 96%  LMP 07/22/2015 Physical Exam  Constitutional: She appears well-developed and well-nourished. No distress.  HENT:  Head: Normocephalic and atraumatic.  Mouth/Throat: Oropharynx is clear and moist. No oropharyngeal exudate.  Eyes: Conjunctivae and EOM are normal. Pupils are equal, round, and reactive to light. Right eye exhibits no discharge. Left eye exhibits no discharge. No scleral icterus.  Neck: Normal range of motion. Neck supple.  Cardiovascular: Normal rate, regular rhythm, normal heart sounds and intact distal pulses.   No murmur heard. Pulmonary/Chest: Effort normal and breath sounds normal. No respiratory distress.  Abdominal: Soft. Bowel sounds are normal. She exhibits no distension. There is tenderness ( suprapubic, RLQ, LLQ). There is guarding (suprapubic, RLQ, LLQ). There is no rebound.  Genitourinary: Vagina normal. Pelvic exam was performed  with patient prone. There is no rash, tenderness, lesion or injury on the right labia. There is no rash, tenderness, lesion or injury on the left labia. Cervix exhibits discharge. Cervix exhibits no motion tenderness and no friability. Right adnexum displays no mass and no tenderness. Left adnexum displays no mass and no tenderness.  Chaperone present for duration of exam. Normal external genitalia, no masses, lesions, or rashes. No masses, lesions, ulcerations in vaginal cavity. Cervix is closed. Off-white discharge noted. No bloody discharge. IUD strings noted. No CMT or masses palpated on bimanual.  Musculoskeletal: Normal range of motion.  Lymphadenopathy:    She has no cervical adenopathy.  Neurological: She is alert. Coordination normal.  Skin: Skin is warm and dry. She is not diaphoretic.  Psychiatric: She has a normal mood and affect. Her behavior is normal.    ED Course  Procedures (including critical care time) Labs Review Labs Reviewed  WET PREP, GENITAL -  Abnormal; Notable for the following:    Yeast Wet Prep HPF POC PRESENT (*)    Clue Cells Wet Prep HPF POC PRESENT (*)    WBC, Wet Prep HPF POC MANY (*)    All other components within normal limits  URINALYSIS, ROUTINE W REFLEX MICROSCOPIC (NOT AT [facility]) - Abnormal; Notable for the following:    Leukocytes, UA TRACE (*)    All other components within normal limits  URINE MICROSCOPIC-ADD ON - Abnormal; Notable for the following:    Squamous Epithelial / LPF 0-5 (*)    Bacteria, UA FEW (*)    All other components within normal limits  LIPASE, BLOOD  COMPREHENSIVE METABOLIC PANEL  CBC  I-STAT BETA HCG BLOOD, ED ([facility], [facility], [facility] ONLY)  GC/CHLAMYDIA PROBE AMP ([place]) NOT AT [facility]    Imaging Review US Transvaginal Non-ob  08/04/2015  CLINICAL DATA:  30 year old female with left lower quadrant pain EXAM: TRANSABDOMINAL AND TRANSVAGINAL ULTRASOUND OF PELVIS DOPPLER ULTRASOUND OF OVARIES TECHNIQUE: Both transabdominal and  transvaginal ultrasound examinations of the pelvis were performed. Transabdominal technique was performed for global imaging of the pelvis including uterus, ovaries, adnexal regions, and pelvic cul-de-sac. It was necessary to proceed with endovaginal exam following the transabdominal exam to visualize the endometrium and the ovaries. Color and duplex Doppler ultrasound was utilized to evaluate blood flow to the ovaries. COMPARISON:  Ultrasound dated 05/16/2015 FINDINGS: Uterus Measurements: 11.3 x 4.0 x 4.9 cm. No fibroids or other mass visualized. Endometrium Thickness: 5 mm. An intrauterine device is noted. The positioning of the IUD is not well evaluated on this study. No focal abnormality visualized. Right ovary Measurements: 3.6 x 2.3 x 3.1 cm. Normal appearance/no adnexal mass. Left ovary Measurements: 4.7 x 3.2 x 5.1 cm. There is a 3.6 x 2.7 x 3.9 cm cyst in the left ovary. Pulsed Doppler evaluation of both ovaries demonstrates normal low-resistance arterial and venous waveforms. Other findings No abnormal free fluid. IMPRESSION: Left ovarian cyst otherwise unremarkable pelvic ultrasound. Bilateral ovarian Doppler flow demonstrate. IUD device with suboptimal evaluation for positioning. Electronically Signed   By: Elgie Collard M.D.   On: 08/04/2015 19:52   US Pelvis Complete  08/04/2015  CLINICAL DATA:  30 year old female with left lower quadrant pain EXAM: TRANSABDOMINAL AND TRANSVAGINAL ULTRASOUND OF PELVIS DOPPLER ULTRASOUND OF OVARIES TECHNIQUE: Both transabdominal and transvaginal ultrasound examinations of the pelvis were performed. Transabdominal technique was performed for global imaging of the pelvis including uterus, ovaries, adnexal regions, and pelvic cul-de-sac. It was necessary to proceed with endovaginal exam following the transabdominal exam to visualize the endometrium and the ovaries. Color and duplex Doppler ultrasound was utilized to evaluate blood flow to the ovaries. COMPARISON:   Ultrasound dated 05/16/2015 FINDINGS: Uterus Measurements: 11.3 x 4.0 x 4.9 cm. No fibroids or other mass visualized. Endometrium Thickness: 5 mm. An intrauterine device is noted. The positioning of the IUD is not well evaluated on this study. No focal abnormality visualized. Right ovary Measurements: 3.6 x 2.3 x 3.1 cm. Normal appearance/no adnexal mass. Left ovary Measurements: 4.7 x 3.2 x 5.1 cm. There is a 3.6 x 2.7 x 3.9 cm cyst in the left ovary. Pulsed Doppler evaluation of both ovaries demonstrates normal low-resistance arterial and venous waveforms. Other findings No abnormal free fluid. IMPRESSION: Left ovarian cyst otherwise unremarkable pelvic ultrasound. Bilateral ovarian Doppler flow demonstrate. IUD device with suboptimal evaluation for positioning. Electronically Signed   By: Elgie Collard M.D.   On: 08/04/2015 19:52   [place] Art/ven  Flow Abd Pelv Doppler  08/04/2015  CLINICAL DATA:  30 year old female with left lower quadrant pain EXAM: TRANSABDOMINAL AND TRANSVAGINAL ULTRASOUND OF PELVIS DOPPLER ULTRASOUND OF OVARIES TECHNIQUE: Both transabdominal and transvaginal ultrasound examinations of the pelvis were performed. Transabdominal technique was performed for global imaging of the pelvis including uterus, ovaries, adnexal regions, and pelvic cul-de-sac. It was necessary to proceed with endovaginal exam following the transabdominal exam to visualize the endometrium and the ovaries. Color and duplex Doppler ultrasound was utilized to evaluate blood flow to the ovaries. COMPARISON:  Ultrasound dated 05/16/2015 FINDINGS: Uterus Measurements: 11.3 x 4.0 x 4.9 cm. No fibroids or other mass visualized. Endometrium Thickness: 5 mm. An intrauterine device is noted. The positioning of the IUD is not well evaluated on this study. No focal abnormality visualized. Right ovary Measurements: 3.6 x 2.3 x 3.1 cm. Normal appearance/no adnexal mass. Left ovary Measurements: 4.7 x 3.2 x 5.1 cm. There is a 3.6 x 2.7 x  3.9 cm cyst in the left ovary. Pulsed Doppler evaluation of both ovaries demonstrates normal low-resistance arterial and venous waveforms. Other findings No abnormal free fluid. IMPRESSION: Left ovarian cyst otherwise unremarkable pelvic ultrasound. Bilateral ovarian Doppler flow demonstrate. IUD device with suboptimal evaluation for positioning. Electronically Signed   By: Elgie Collard M.D.   On: 08/04/2015 19:52   I have personally reviewed and evaluated these images and lab results as part of my medical decision-making.   EKG Interpretation None      MDM   Final diagnoses:  Pelvic pain in female  Bacterial vaginosis  Yeast vaginitis   Patient is afebrile and nontoxic. Vital signs are stable. Physical exam remarkable for tenderness to palpation of suprapubic, left lower quadrant, right lower quadrant abdomen with mild guarding. Concern for torsion, ectopic, pyelonephritis, UTI, appendicitis, and diverticulitis.   Pregnancy test negative. CBC reassuring, low suspicion for diverticulitis or appendicitis. Lipase normal, doubt pancreatitis. CMP unremarkable. Urinalysis shows leukocytes, bacteria, WBCs; however, squamous epithelial present, no urinary symptoms. Doubt UTI or pyelonephritis. Suspect specimen contamination. Bilateral Doppler flow on [place]. IUD device not well seen, however no focal abnormality. Wet prep positive for yeast and clue cells. Will treat for yeast and BV. ? Side effect of IUD - patient has had intermittent pain since placement of IUD approximately one year ago.   Pain is improved following Toradol. Patient still tender to palpation in suprapubic region. Discussed results with patient. Provided Rx for antibiotics and antifungal. Discussed symptomatic treatment. Encouraged follow-up with women's clinic for reevaluation and discussion of whether contraceptive methods. Discussed return precautions. Patient voiced understanding and is agreeable.    Lona Kettle,  [place] 08/05/15 [id number]  Rolland Porter, MD 08/13/15 [phone]

## 2015-08-04 NOTE — ED Notes (Signed)
US at bedside

## 2015-08-05 LAB — GC/CHLAMYDIA PROBE AMP (~~LOC~~) NOT AT ARMC
Chlamydia: NEGATIVE
Neisseria Gonorrhea: NEGATIVE

## 2015-08-17 ENCOUNTER — Encounter: Payer: Self-pay | Admitting: Family Medicine

## 2015-08-17 ENCOUNTER — Other Ambulatory Visit: Payer: Self-pay | Admitting: Family Medicine

## 2015-08-18 ENCOUNTER — Ambulatory Visit (HOSPITAL_COMMUNITY): Payer: Medicare (Managed Care) | Admitting: Psychiatry

## 2015-08-18 ENCOUNTER — Telehealth: Payer: Self-pay

## 2015-08-18 DIAGNOSIS — F410 Panic disorder [episodic paroxysmal anxiety] without agoraphobia: Secondary | ICD-10-CM

## 2015-08-18 DIAGNOSIS — F411 Generalized anxiety disorder: Secondary | ICD-10-CM

## 2015-08-18 MED ORDER — ALPRAZOLAM 0.25 MG PO TABS: 0.2500 mg | ORAL_TABLET | Freq: Two times a day (BID) | ORAL | Status: DC | PRN

## 2015-08-18 NOTE — Telephone Encounter (Signed)
Refill request for xanax. Please advise. Thanks!  

## 2015-08-19 ENCOUNTER — Ambulatory Visit (INDEPENDENT_AMBULATORY_CARE_PROVIDER_SITE_OTHER): Payer: Medicaid Other | Admitting: Obstetrics

## 2015-08-19 ENCOUNTER — Encounter: Payer: Self-pay | Admitting: Obstetrics

## 2015-08-19 VITALS — BP 126/72 | HR 96 | Temp 98.7°F | Wt 242.0 lb

## 2015-08-19 DIAGNOSIS — Z1389 Encounter for screening for other disorder: Secondary | ICD-10-CM

## 2015-08-19 DIAGNOSIS — Z30431 Encounter for routine checking of intrauterine contraceptive device: Secondary | ICD-10-CM

## 2015-08-19 DIAGNOSIS — Z01419 Encounter for gynecological examination (general) (routine) without abnormal findings: Secondary | ICD-10-CM | POA: Diagnosis not present

## 2015-08-19 DIAGNOSIS — Z0000 Encounter for general adult medical examination without abnormal findings: Secondary | ICD-10-CM

## 2015-08-19 DIAGNOSIS — N946 Dysmenorrhea, unspecified: Secondary | ICD-10-CM

## 2015-08-19 LAB — POCT URINALYSIS DIPSTICK
Bilirubin, UA: NEGATIVE
Glucose, UA: NEGATIVE
KETONES UA: NEGATIVE
LEUKOCYTES UA: NEGATIVE
Nitrite, UA: NEGATIVE
PH UA: 5
PROTEIN UA: NEGATIVE
RBC UA: NEGATIVE
SPEC GRAV UA: 1.02
Urobilinogen, UA: NEGATIVE

## 2015-08-19 MED ORDER — IBUPROFEN 800 MG PO TABS: 800.0000 mg | ORAL_TABLET | Freq: Three times a day (TID) | ORAL | Status: DC | PRN

## 2015-08-20 ENCOUNTER — Encounter: Payer: Self-pay | Admitting: Obstetrics

## 2015-08-20 NOTE — Progress Notes (Signed)
Subjective:        Angela Doyle is a 30 y.o. female here for a routine exam.  Current complaints: Feels that IUD is out of place..    Personal health questionnaire:  Is patient [name], have a family history of breast and/or ovarian cancer: no Is there a family history of uterine cancer diagnosed at age < 4950, gastrointestinal cancer, urinary tract cancer, family member who is a Personnel officerLynch syndrome-associated carrier: no Is the patient overweight and hypertensive, family history of diabetes, personal history of gestational diabetes, preeclampsia or PCOS: no Is patient over 8055, have PCOS,  family history of premature CHD under age 28, diabetes, smoke, have hypertension or peripheral artery disease:  no At any time, has a partner hit, kicked or otherwise hurt or frightened you?: no Over the past 2 weeks, have you felt down, depressed or hopeless?: no Over the past 2 weeks, have you felt little interest or pleasure in doing things?:no   Gynecologic History Patient's last menstrual period was 07/22/2015. Contraception: IUD Last Pap: unknown. Results were: unknown Last mammogram: n/a. Results were: n/a  Obstetric History OB History  Gravida Para Term Preterm AB SAB TAB Ectopic Multiple Living  2 1 1  1     1     # Outcome Date GA Lbr Len/2nd Weight Sex Delivery Anes PTL Lv  2 AB           1 Term               Past Medical History  Diagnosis Date  . Seizures (HCC)   . Neuropathy (HCC)   . Epilepsy (HCC)   . Neuropathy (HCC)   . Headache   . Vertigo   . Stroke [facility](HCC)     tia    Past Surgical History  Procedure Laterality Date  . Cesarean section  2011  . Ovarian cyst surgery       Current outpatient prescriptions:  .  ALPRAZolam (XANAX) 0.25 MG tablet, Take 1 tablet (0.25 mg total) by mouth 2 (two) times daily as needed for anxiety., Disp: 30 tablet, Rfl: 0 .  busPIRone (BUSPAR) 5 MG tablet, Take 1 tablet (5 mg total) by mouth 2 (two) times daily., Disp: 60  tablet, Rfl: 1 .  docusate sodium (COLACE) 100 MG capsule, Take 1 capsule (100 mg total) by mouth daily., Disp: 30 capsule, Rfl: 0 .  furosemide (LASIX) 20 MG tablet, TAKE 1 TABLET(20 MG) BY MOUTH DAILY, Disp: 30 tablet, Rfl: 0 .  ibuprofen (ADVIL,MOTRIN) 600 MG tablet, Take 1 tablet (600 mg total) by mouth every 6 (six) hours as needed., Disp: 30 tablet, Rfl: 0 .  metroNIDAZOLE (FLAGYL) 500 MG tablet, Take 1 tablet (500 mg total) by mouth 2 (two) times daily., Disp: 14 tablet, Rfl: 0 .  naproxen (NAPROSYN) 500 MG tablet, Take 1 tablet (500 mg total) by mouth 2 (two) times daily., Disp: 30 tablet, Rfl: 0 .  sertraline (ZOLOFT) 100 MG tablet, Take 1 tablet (100 mg total) by mouth daily., Disp: 30 tablet, Rfl: 2 .  gabapentin (NEURONTIN) 300 MG capsule, Take 2 caps three times daily, Disp: 180 capsule, Rfl: 2 .  ibuprofen (ADVIL,MOTRIN) 800 MG tablet, Take 1 tablet (800 mg total) by mouth every 8 (eight) hours as needed., Disp: 30 tablet, Rfl: 5 .  meclizine (ANTIVERT) 12.5 MG tablet, Take 1 tablet (12.5 mg total) by mouth 3 (three) times daily as needed for dizziness., Disp: 30 tablet, Rfl: 1 Allergies  Allergen Reactions  .  Pineapple Swelling    Oral swelling    Social History  Substance Use Topics  . Smoking status: Current Every Day Smoker -- 0.25 packs/day    Types: Cigarettes  . Smokeless tobacco: Never Used  . Alcohol Use: 0.0 oz/week    0 Standard drinks or equivalent per week     Comment: occ    Family History  Problem Relation Age of Onset  . Neuropathy Mother   . Heart disease Maternal Grandmother   . Diabetes Maternal Grandmother       Review of Systems  Constitutional: negative for fatigue and weight loss Respiratory: negative for cough and wheezing Cardiovascular: negative for chest pain, fatigue and palpitations Gastrointestinal: negative for abdominal pain and change in bowel habits Musculoskeletal:negative for myalgias Neurological: negative for gait problems and  tremors Behavioral/Psych: negative for abusive relationship, depression Endocrine: negative for temperature intolerance   Genitourinary:negative for abnormal menstrual periods, genital lesions, hot flashes, sexual problems and vaginal discharge Integument/breast: negative for breast lump, breast tenderness, nipple discharge and skin lesion(s)    Objective:       BP 126/72 mmHg  Pulse 96  Temp(Src) 98.7 F (37.1 C)  Wt 242 lb (109.77 kg)  LMP 07/22/2015 General:   alert  Skin:   no rash or abnormalities  Lungs:   clear to auscultation bilaterally  Heart:   regular rate and rhythm, S1, S2 normal, no murmur, click, rub or gallop  Breasts:   normal without suspicious masses, skin or nipple changes or axillary nodes  Abdomen:  normal findings: no organomegaly, soft, non-tender and no hernia  Pelvis:  External genitalia: normal general appearance Urinary system: urethral meatus normal and bladder without fullness, nontender Vaginal: normal without tenderness, induration or masses Cervix: normal appearance Adnexa: normal bimanual exam Uterus: anteverted and non-tender, normal size   Lab Review Urine pregnancy test Labs reviewed no Radiologic studies reviewed no    Assessment:    Healthy female exam.    Contraceptive Surveillance     Plan:    Ultrasound ordered  Education reviewed: calcium supplements, low fat, low cholesterol diet, safe sex/STD prevention, self breast exams, smoking cessation and weight bearing exercise. Contraception: IUD. Follow up in: 2 weeks.   Meds ordered this encounter  Medications  . ibuprofen (ADVIL,MOTRIN) 800 MG tablet    Sig: Take 1 tablet (800 mg total) by mouth every 8 (eight) hours as needed.    Dispense:  30 tablet    Refill:  5   Orders Placed This Encounter  Procedures  . POCT urinalysis dipstick

## 2015-08-22 ENCOUNTER — Other Ambulatory Visit: Payer: Self-pay | Admitting: Obstetrics

## 2015-08-22 DIAGNOSIS — A5901 Trichomonal vulvovaginitis: Secondary | ICD-10-CM

## 2015-08-22 LAB — NUSWAB VG+, CANDIDA 6SP
ATOPOBIUM VAGINAE: HIGH {score} — AB
BVAB 2: HIGH Score — AB
CANDIDA ALBICANS, NAA: NEGATIVE
CANDIDA GLABRATA, NAA: NEGATIVE
CANDIDA KRUSEI, NAA: NEGATIVE
CANDIDA LUSITANIAE, NAA: NEGATIVE
CANDIDA PARAPSILOSIS, NAA: NEGATIVE
CANDIDA TROPICALIS, NAA: NEGATIVE
Chlamydia trachomatis, NAA: NEGATIVE
NEISSERIA GONORRHOEAE, NAA: NEGATIVE
Trich vag by NAA: POSITIVE — AB

## 2015-08-22 LAB — PAP IG W/ RFLX HPV ASCU: PAP SMEAR COMMENT: 0

## 2015-08-22 MED ORDER — TINIDAZOLE 500 MG PO TABS: 2.0000 g | ORAL_TABLET | Freq: Once | ORAL | Status: DC

## 2015-08-27 ENCOUNTER — Telehealth: Payer: Self-pay | Admitting: *Deleted

## 2015-08-27 NOTE — Telephone Encounter (Signed)
Left Messagefor Patient to call back . BV --RX Tindamax.

## 2015-09-03 ENCOUNTER — Ambulatory Visit: Payer: Medicaid Other | Admitting: Family Medicine

## 2015-09-09 ENCOUNTER — Other Ambulatory Visit: Payer: Self-pay

## 2015-09-09 DIAGNOSIS — F411 Generalized anxiety disorder: Secondary | ICD-10-CM

## 2015-09-09 MED ORDER — BUSPIRONE HCL 5 MG PO TABS: 5.0000 mg | ORAL_TABLET | Freq: Two times a day (BID) | ORAL | Status: DC

## 2015-09-09 NOTE — Telephone Encounter (Signed)
Refill for buspirone sent into pharmacy. Thanks!  

## 2015-09-10 ENCOUNTER — Encounter: Payer: Self-pay | Admitting: Obstetrics

## 2015-09-10 ENCOUNTER — Encounter: Payer: Self-pay | Admitting: Family Medicine

## 2015-09-13 ENCOUNTER — Other Ambulatory Visit: Payer: Self-pay | Admitting: Family Medicine

## 2015-09-15 ENCOUNTER — Ambulatory Visit (HOSPITAL_COMMUNITY): Payer: Medicaid Other | Admitting: Clinical

## 2015-09-15 ENCOUNTER — Other Ambulatory Visit: Payer: Self-pay | Admitting: *Deleted

## 2015-09-15 DIAGNOSIS — A599 Trichomoniasis, unspecified: Secondary | ICD-10-CM

## 2015-09-15 MED ORDER — METRONIDAZOLE 500 MG PO TABS: 2000.0000 mg | ORAL_TABLET | Freq: Once | ORAL | Status: DC

## 2015-09-22 ENCOUNTER — Telehealth: Payer: Self-pay | Admitting: Neurology

## 2015-09-22 NOTE — Telephone Encounter (Signed)
Records from vEEG stay at [facility] reviewed:  Baseline EEG normal. Patient had 2 PB events for left arm stiffening, bilateral hand clenching, eye closure, subtle opening and closing of jaw, resisted eye opening. Normal EEG, c/w Psychogenic non-epileptic events. While staring spells or events where she would wake in AM and fell like she had a seizure were not captured, it was felt these were unlikely to represent epileptic seizures as well. She was discharged off Keppra and Vimpat. Behavioral Medicine f/u was arranged.   It was also noted that she likely has medication overuse HAs and OTC pain med taper was recommended.

## 2015-09-23 ENCOUNTER — Other Ambulatory Visit: Payer: Self-pay | Admitting: Internal Medicine

## 2015-09-28 ENCOUNTER — Ambulatory Visit: Payer: Medicaid Other | Admitting: Family Medicine

## 2015-09-28 ENCOUNTER — Telehealth: Payer: Self-pay

## 2015-09-28 DIAGNOSIS — F411 Generalized anxiety disorder: Secondary | ICD-10-CM

## 2015-09-28 DIAGNOSIS — A5901 Trichomonal vulvovaginitis: Secondary | ICD-10-CM

## 2015-09-28 MED ORDER — BUSPIRONE HCL 5 MG PO TABS: 5.0000 mg | ORAL_TABLET | Freq: Two times a day (BID) | ORAL | 1 refills | Status: DC

## 2015-09-28 MED ORDER — FUROSEMIDE 20 MG PO TABS: ORAL_TABLET | ORAL | 0 refills | Status: DC

## 2015-09-28 MED ORDER — MECLIZINE HCL 12.5 MG PO TABS: 12.5000 mg | ORAL_TABLET | Freq: Three times a day (TID) | ORAL | 1 refills | Status: DC | PRN

## 2015-09-28 NOTE — Telephone Encounter (Signed)
Buspirone, Lasix, and meclizine sent into pharmacy. Thanks!

## 2015-09-30 ENCOUNTER — Telehealth: Payer: Self-pay | Admitting: Neurology

## 2015-09-30 NOTE — Telephone Encounter (Signed)
Received faxed results of MRI cervical and lumbar spine done 09/22/15:  Mild cervical spondylosis, no herniated disc, spinal stenosis, or foraminal stenosis. Facet joint arthritis lower lumbar spine. No herniated disc, spinal stenosis, or foraminal stenosis. 3.5cm cystic lesion right pelvis, most likely ovarian cyst.

## 2015-10-01 ENCOUNTER — Emergency Department (HOSPITAL_COMMUNITY)
Admission: EM | Admit: 2015-10-01 | Discharge: 2015-10-01 | Disposition: A | Discharge status: Home / Self Care | Payer: Medicaid Other | Attending: Emergency Medicine | Admitting: Emergency Medicine

## 2015-10-01 ENCOUNTER — Ambulatory Visit (HOSPITAL_COMMUNITY): Payer: Medicaid Other | Admitting: Clinical

## 2015-10-01 ENCOUNTER — Emergency Department (HOSPITAL_COMMUNITY): Payer: Medicaid Other

## 2015-10-01 ENCOUNTER — Encounter (HOSPITAL_COMMUNITY): Payer: Self-pay | Admitting: Emergency Medicine

## 2015-10-01 DIAGNOSIS — Z79899 Other long term (current) drug therapy: Secondary | ICD-10-CM | POA: Insufficient documentation

## 2015-10-01 DIAGNOSIS — Z791 Long term (current) use of non-steroidal anti-inflammatories (NSAID): Secondary | ICD-10-CM | POA: Insufficient documentation

## 2015-10-01 DIAGNOSIS — F17210 Nicotine dependence, cigarettes, uncomplicated: Secondary | ICD-10-CM | POA: Insufficient documentation

## 2015-10-01 DIAGNOSIS — R1084 Generalized abdominal pain: Secondary | ICD-10-CM | POA: Diagnosis present

## 2015-10-01 DIAGNOSIS — R112 Nausea with vomiting, unspecified: Secondary | ICD-10-CM | POA: Insufficient documentation

## 2015-10-01 LAB — COMPREHENSIVE METABOLIC PANEL
ALBUMIN: 4 g/dL (ref 3.5–5.0)
ALK PHOS: 72 U/L (ref 38–126)
ALT: 36 U/L (ref 14–54)
AST: 27 U/L (ref 15–41)
Anion gap: 6 (ref 5–15)
BUN: 20 mg/dL (ref 6–20)
CALCIUM: 8.7 mg/dL — AB (ref 8.9–10.3)
CO2: 26 mmol/L (ref 22–32)
CREATININE: 0.73 mg/dL (ref 0.44–1.00)
Chloride: 109 mmol/L (ref 101–111)
GFR calc Af Amer: >60 mL/min (ref >60)
GFR calc non Af Amer: >60 mL/min (ref >60)
GLUCOSE: 115 mg/dL — AB (ref 65–99)
Potassium: 4.1 mmol/L (ref 3.5–5.1)
SODIUM: 141 mmol/L (ref 135–145)
Total Bilirubin: 0.7 mg/dL (ref 0.3–1.2)
Total Protein: 7.4 g/dL (ref 6.5–8.1)

## 2015-10-01 LAB — CBC
HCT: 42.7 % (ref 36.0–46.0)
HEMOGLOBIN: 14.5 g/dL (ref 12.0–15.0)
MCH: 31.8 pg (ref 26.0–34.0)
MCHC: 34 g/dL (ref 30.0–36.0)
MCV: 93.6 fL (ref 78.0–100.0)
Platelets: 214 10*3/uL (ref 150–400)
RBC: 4.56 MIL/uL (ref 3.87–5.11)
RDW: 13.3 % (ref 11.5–15.5)
WBC: 4.7 10*3/uL (ref 4.0–10.5)

## 2015-10-01 LAB — URINALYSIS, ROUTINE W REFLEX MICROSCOPIC
BILIRUBIN URINE: NEGATIVE
GLUCOSE, UA: NEGATIVE mg/dL
HGB URINE DIPSTICK: NEGATIVE
Ketones, ur: NEGATIVE mg/dL
Leukocytes, UA: NEGATIVE
Nitrite: NEGATIVE
Protein, ur: NEGATIVE mg/dL
SPECIFIC GRAVITY, URINE: 1.021 (ref 1.005–1.030)
pH: 5.5 (ref 5.0–8.0)

## 2015-10-01 LAB — PREGNANCY, URINE: PREG TEST UR: NEGATIVE

## 2015-10-01 LAB — LIPASE, BLOOD: Lipase: 17 U/L (ref 11–51)

## 2015-10-01 MED ORDER — ONDANSETRON 4 MG PO TBDP: 4.0000 mg | ORAL_TABLET | Freq: Three times a day (TID) | ORAL | 0 refills | Status: DC | PRN

## 2015-10-01 MED ORDER — IOPAMIDOL (ISOVUE-300) INJECTION 61%
100.0000 mL | Freq: Once | INTRAVENOUS | Status: AC | PRN
Start: 2015-10-01 — End: 2015-10-01
  Administered 2015-10-01: 100 mL via INTRAVENOUS

## 2015-10-01 MED ORDER — ONDANSETRON HCL 4 MG/2ML IJ SOLN
4.0000 mg | Freq: Once | INTRAMUSCULAR | Status: AC
  Administered 2015-10-01: 4 mg via INTRAVENOUS
  Filled 2015-10-01: qty 2

## 2015-10-01 MED ORDER — ACETAMINOPHEN 325 MG PO TABS
650.0000 mg | ORAL_TABLET | Freq: Once | ORAL | Status: AC
  Administered 2015-10-01: 650 mg via ORAL
  Filled 2015-10-01: qty 2

## 2015-10-01 MED ORDER — HYDROMORPHONE HCL 1 MG/ML IJ SOLN
1.0000 mg | Freq: Once | INTRAMUSCULAR | Status: AC
  Administered 2015-10-01: 1 mg via INTRAVENOUS
  Filled 2015-10-01: qty 1

## 2015-10-01 NOTE — ED Triage Notes (Signed)
Patient is complaining of RLQ abdominal pain with some nausea.  She denies diarrhea.    BP: 134/80  P:80  O2: 99% on room air  R:20

## 2015-10-01 NOTE — Discharge Instructions (Signed)
Please read and follow all provided instructions.  Your diagnoses today include:  1. Generalized abdominal pain     Tests performed today include: Blood counts and electrolytes Blood tests to check liver and kidney function Blood tests to check pancreas function  Urine test to look for infection and pregnancy CT scan of your abdomen and pelvis -- no problems Vital signs. See below for your results today.   Medications prescribed:  Zofran (ondansetron) - for nausea and vomiting  Take any prescribed medications only as directed.  Home care instructions:  Follow any educational materials contained in this packet.  Follow-up instructions: Please follow-up with your primary care provider in the next 2 days for further evaluation of your symptoms.    Return instructions:  SEEK IMMEDIATE MEDICAL ATTENTION IF: The pain does not go away or becomes severe  A temperature above 101F develops  Repeated vomiting occurs (multiple episodes)  The pain becomes localized to portions of the abdomen. The right side could possibly be appendicitis. In an adult, the left lower portion of the abdomen could be colitis or diverticulitis.  Blood is being passed in stools or vomit (bright red or black tarry stools)  You develop chest pain, difficulty breathing, dizziness or fainting, or become confused, poorly responsive, or inconsolable (young children) If you have any other emergent concerns regarding your health  Additional Information: Abdominal (belly) pain can be caused by many things. Your caregiver performed an examination and possibly ordered blood/urine tests and imaging (CT scan, x-rays, ultrasound). Many cases can be observed and treated at home after initial evaluation in the emergency department. Even though you are being discharged home, abdominal pain can be unpredictable. Therefore, you need a repeated exam if your pain does not resolve, returns, or worsens. Most patients with abdominal pain  don't have to be admitted to the hospital or have surgery, but serious problems like appendicitis and gallbladder attacks can start out as nonspecific pain. Many abdominal conditions cannot be diagnosed in one visit, so follow-up evaluations are very important.  Your vital signs today were: BP 141/94    Pulse 107    Temp 98.6 F (37 C)    Resp 22    SpO2 97%  If your blood pressure (bp) was elevated above 135/85 this visit, please have this repeated by your doctor within one month. --------------

## 2015-10-01 NOTE — ED Notes (Signed)
Attempted blood draw with no success.     

## 2015-10-01 NOTE — ED Notes (Signed)
PA at bedside.

## 2015-10-01 NOTE — ED Provider Notes (Signed)
[facility]-EMERGENCY DEPT Provider Note   CSN: [id number] Arrival date & time: 10/01/15  1339  First Provider Contact:  None       History   Chief Complaint Chief Complaint  Patient presents with  . Abdominal Pain    HPI Angela Doyle is a 30 y.o. female.  Patient presents with complaint of generalized abdominal pain which has been ongoing for one week. Patient has no past surgical history other than a cesarean section. Patient states that she began with more mild pain in the right lower quadrant. This became much worse this morning was associated with vomiting. No fevers, chest pain, shortness of breath. No urinary symptoms, vaginal bleeding or discharge (IUD in place). No change in stools. Over-the-counter medications have not been helping. States that she has had ovarian cysts in the past. The onset of this condition was acute. Aggravating factors: movement. Alleviating factors: none.        Past Medical History:  Diagnosis Date  . Epilepsy (HCC)   . Headache   . Neuropathy (HCC)   . Neuropathy (HCC)   . Seizures (HCC)   . Stroke (HCC)    tia  . Vertigo     Patient Active Problem List   Diagnosis Date Noted  . Convulsions (HCC) 06/08/2015  . Generalized anxiety disorder 06/08/2015  . Chronic pain syndrome 06/08/2015  . Migraine without aura and without status migrainosus, not intractable 06/08/2015  . Nonintractable epilepsy with status epilepticus (HCC) 05/28/2015  . Seizure disorder (HCC) 05/28/2015  . Depression 05/28/2015  . Obstructive sleep apnea 05/28/2015  . Tobacco dependence 05/28/2015  . Neuropathy [facility])     Past Surgical History:  Procedure Laterality Date  . CESAREAN SECTION  2011  . OVARIAN CYST SURGERY      OB History    Gravida Para Term Preterm AB Living   SAB TAB Ectopic Multiple Live Births                   Home Medications    Prior to Admission medications   Medication Sig Start Date End Date Taking? Authorizing  Provider  ALPRAZolam (XANAX) 0.25 MG tablet Take 1 tablet (0.25 mg total) by mouth 2 (two) times daily as needed for anxiety. 08/18/15   Massie Maroon, FNP  busPIRone (BUSPAR) 5 MG tablet Take 1 tablet (5 mg total) by mouth 2 (two) times daily. 09/28/15   Henrietta Hoover, NP  docusate sodium (COLACE) 100 MG capsule Take 1 capsule (100 mg total) by mouth daily. 07/23/15   Massie Maroon, FNP  furosemide (LASIX) 20 MG tablet TAKE 1 TABLET(20 MG) BY MOUTH DAILY 09/28/15   Henrietta Hoover, NP  gabapentin (NEURONTIN) 300 MG capsule Take 2 caps three times daily 07/29/15   Octaviano Batty Tat, DO  ibuprofen (ADVIL,MOTRIN) 600 MG tablet Take 1 tablet (600 mg total) by mouth every 6 (six) hours as needed. 07/31/15   Derwood Kaplan, MD  ibuprofen (ADVIL,MOTRIN) 800 MG tablet Take 1 tablet (800 mg total) by mouth every 8 (eight) hours as needed. 08/19/15   Brock Bad, MD  meclizine (ANTIVERT) 12.5 MG tablet Take 1 tablet (12.5 mg total) by mouth 3 (three) times daily as needed for dizziness. 09/28/15   Henrietta Hoover, NP  metroNIDAZOLE (FLAGYL) 500 MG tablet Take 1 tablet (500 mg total) by mouth 2 (two) times daily. 08/04/15   Lona Kettle, PA-C  metroNIDAZOLE (FLAGYL) 500 MG  tablet Take 4 tablets (2,000 mg total) by mouth once. 09/15/15   Brock Bad, MD  naproxen (NAPROSYN) 500 MG tablet Take 1 tablet (500 mg total) by mouth 2 (two) times daily. 05/16/15   Hope Orlene Och, NP  sertraline (ZOLOFT) 100 MG tablet Take 1 tablet (100 mg total) by mouth daily. 05/28/15   Massie Maroon, FNP  tinidazole (TINDAMAX) 500 MG tablet Take 4 tablets (2,000 mg total) by mouth once. 08/22/15   Brock Bad, MD    Family History Family History  Problem Relation Age of Onset  . Neuropathy Mother   . Heart disease Maternal Grandmother   . Diabetes Maternal Grandmother     Social History Social History  Substance Use Topics  . Smoking status: Current Every Day Smoker    Packs/day: 0.25    Types:  Cigarettes  . Smokeless tobacco: Never Used  . Alcohol use 0.0 oz/week     Comment: occ     Allergies   Pineapple   Review of Systems Review of Systems  Constitutional: Negative for fever.  HENT: Negative for rhinorrhea and sore throat.   Eyes: Negative for redness.  Respiratory: Negative for cough.   Cardiovascular: Negative for chest pain.  Gastrointestinal: Positive for abdominal pain, nausea and vomiting. Negative for constipation and diarrhea.  Genitourinary: Negative for dysuria, vaginal bleeding and vaginal discharge.  Musculoskeletal: Negative for myalgias.  Skin: Negative for rash.  Neurological: Negative for headaches.     Physical Exam Updated Vital Signs BP 114/75 (BP Location: Left Arm)   Pulse 75   Temp 97.9 F (36.6 C) (Oral)   Resp 18   SpO2 99%   Physical Exam  Constitutional: She appears well-developed and well-nourished. She appears distressed (uncomfortable appearing).  HENT:  Head: Normocephalic and atraumatic.  Mouth/Throat: Oropharynx is clear and moist.  Eyes: Conjunctivae are normal. Right eye exhibits no discharge. Left eye exhibits no discharge.  Neck: Normal range of motion. Neck supple.  Cardiovascular: Normal rate, regular rhythm and normal heart sounds.   Pulmonary/Chest: Effort normal and breath sounds normal.  Abdominal: Soft. She exhibits no distension and no mass. There is generalized tenderness. There is no guarding.  Generalized tenderness without focality, patient reports pain seems to be worse on R. No focal RUQ, RLQ, pelvic pain. No rebound.   Neurological: She is alert.  Skin: Skin is warm and dry.  Psychiatric: She has a normal mood and affect.  Nursing note and vitals reviewed.    ED Treatments / Results  Labs (all labs ordered are listed, but only abnormal results are displayed) Labs Reviewed  COMPREHENSIVE METABOLIC PANEL - Abnormal; Notable for the following:       Result Value   Glucose, Bld 115 (*)    Calcium  8.7 (*)    All other components within normal limits  LIPASE, BLOOD  CBC  URINALYSIS, ROUTINE W REFLEX MICROSCOPIC (NOT AT [facility])  PREGNANCY, URINE    EKG  EKG Interpretation None       Radiology Ct Abdomen Pelvis W Contrast  Result Date: 10/01/2015 CLINICAL DATA:  Initial evaluation for acute right lower quadrant abdominal pain. EXAM: CT ABDOMEN AND PELVIS WITH CONTRAST TECHNIQUE: Multidetector CT imaging of the abdomen and pelvis was performed using the standard protocol following bolus administration of intravenous contrast. CONTRAST:  ISOVUE-300 IOPAMIDOL (ISOVUE-300) INJECTION 61% COMPARISON:  Prior ultrasound from 08/04/2015. FINDINGS: Scattered linear opacities within the visualized lung bases most compatible with atelectasis. Visualized lungs are otherwise  clear. Liver demonstrates a normal contrast enhanced appearance. Gallbladder within normal limits. No biliary dilatation. Spleen, adrenal glands, and pancreas demonstrate a normal contrast enhanced appearance. Kidneys are equal in size with symmetric enhancement. No nephrolithiasis, hydronephrosis, or focal enhancing renal mass. Subcentimeter hypodense lesion within the left kidney noted, too small the characterize, but statistically likely reflects a small cyst. Stomach within normal limits. No evidence for bowel obstruction. Appendix well visualized in the right lower quadrant and is of normal caliber and appearance associated inflammatory changes to suggest acute appendicitis. No abnormal wall thickening, inflammatory fat stranding, or mucosal enhancement seen elsewhere about the bowels. Bladder within normal limits. IUD in place within the uterus. Ovaries normal for patient age. Small volume free fluid within the pelvis, presumably physiologic. No free intraperitoneal air. No pathologically enlarged intra-abdominal or pelvic lymph nodes. Normal intravascular enhancement seen throughout the intra-abdominal aorta and its branch  vessels. No acute osseous abnormality. No worrisome lytic or blastic osseous lesions. 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 IMPRESSION: 1. No CT evidence for acute intra-abdominal or pelvic process. 2. Normal appendix. 3. Small volume free fluid within the pelvis, presumably physiologic. Electronically Signed   By: Rise Mu M.D.   On: 10/01/2015 20:26    Procedures Procedures (including critical care time)  Medications Ordered in ED Medications  HYDROmorphone (DILAUDID) injection 1 mg (1 mg Intravenous Given 10/01/15 1555)  ondansetron (ZOFRAN) injection 4 mg (4 mg Intravenous Given 10/01/15 1553)  HYDROmorphone (DILAUDID) injection 1 mg (1 mg Intravenous Given 10/01/15 1716)  iopamidol (ISOVUE-300) 61 % injection 100 mL (100 mLs Intravenous Contrast Given 10/01/15 1956)     Initial Impression / Assessment and Plan / ED Course  I have reviewed the triage vital signs and the nursing notes.  Pertinent labs & imaging results that were available during my care of the patient were reviewed by me and considered in my medical decision making (see chart for details).  Clinical Course   3:11 PM Patient seen and examined. Work-up initiated. Medications ordered.   Vital signs reviewed and are as follows: BP 114/75 (BP Location: Left Arm)   Pulse 75   Temp 97.9 F (36.6 C) (Oral)   Resp 18   SpO2 99%   8:54 PM CT was ordered given patient with persistent Generalized abdominal pain without other etiology. Imaging is negative. Will discharge patient to home with Zofran for nausea. Encouraged PCP follow-up for continued evaluation and recheck.  The patient was urged to return to the Emergency Department immediately with worsening of current symptoms, worsening abdominal pain, persistent vomiting, blood noted in stools, fever, or any other concerns. The patient verbalized understanding.    Final Clinical Impressions(s) / ED Diagnoses   Final  diagnoses:  Generalized abdominal pain   Patient with abdominal pain, non-focal. Vitals are stable, no fever. Labs reassuring. Imaging negative with no signs of appendicitis, pelvic etiology. No signs of dehydration, patient is tolerating PO's. Lungs are clear and no signs suggestive of PNA. Low concern for appendicitis, cholecystitis, pancreatitis, ruptured viscus, UTI, kidney stone, aortic dissection, aortic aneurysm ovarian torsion, or other emergent abdominal etiology. Supportive therapy indicated with return if symptoms worsen.    New Prescriptions New Prescriptions   ONDANSETRON (ZOFRAN ODT) 4 MG DISINTEGRATING TABLET    Take 1 tablet (4 mg total) by mouth every 8 (eight) hours as needed for nausea or vomiting.     Renne Crigler, PA-C 10/01/15 2117    Rolan Bucco, MD 10/01/15 [phone]

## 2015-10-01 NOTE — ED Notes (Signed)
Patient transported to CT 

## 2015-10-02 ENCOUNTER — Encounter: Payer: Self-pay | Admitting: Family Medicine

## 2015-10-15 ENCOUNTER — Encounter: Payer: Self-pay | Admitting: Neurology

## 2015-10-21 ENCOUNTER — Other Ambulatory Visit: Payer: Self-pay | Admitting: Family Medicine

## 2015-10-27 ENCOUNTER — Encounter: Payer: Self-pay | Admitting: Family Medicine

## 2015-10-29 ENCOUNTER — Ambulatory Visit (HOSPITAL_COMMUNITY): Payer: Medicaid Other | Admitting: Psychiatry

## 2015-11-03 ENCOUNTER — Other Ambulatory Visit: Payer: Self-pay | Admitting: Family Medicine

## 2015-11-03 DIAGNOSIS — F410 Panic disorder [episodic paroxysmal anxiety] without agoraphobia: Secondary | ICD-10-CM

## 2015-11-03 DIAGNOSIS — F411 Generalized anxiety disorder: Secondary | ICD-10-CM

## 2015-11-13 ENCOUNTER — Ambulatory Visit: Payer: Medicaid Other | Admitting: Neurology

## 2015-11-15 ENCOUNTER — Other Ambulatory Visit: Payer: Self-pay | Admitting: Obstetrics

## 2015-11-15 DIAGNOSIS — N946 Dysmenorrhea, unspecified: Secondary | ICD-10-CM

## 2015-11-19 ENCOUNTER — Ambulatory Visit (INDEPENDENT_AMBULATORY_CARE_PROVIDER_SITE_OTHER): Payer: Medicaid Other | Admitting: Neurology

## 2015-11-19 ENCOUNTER — Encounter: Payer: Self-pay | Admitting: Neurology

## 2015-11-19 ENCOUNTER — Telehealth: Payer: Self-pay | Admitting: Neurology

## 2015-11-19 VITALS — BP 118/78 | HR 78 | Temp 98.1°F | Ht 62.0 in | Wt 234.4 lb

## 2015-11-19 DIAGNOSIS — G894 Chronic pain syndrome: Secondary | ICD-10-CM

## 2015-11-19 DIAGNOSIS — F445 Conversion disorder with seizures or convulsions: Secondary | ICD-10-CM

## 2015-11-19 DIAGNOSIS — G4440 Drug-induced headache, not elsewhere classified, not intractable: Secondary | ICD-10-CM

## 2015-11-19 DIAGNOSIS — G629 Polyneuropathy, unspecified: Secondary | ICD-10-CM | POA: Diagnosis not present

## 2015-11-19 DIAGNOSIS — R569 Unspecified convulsions: Secondary | ICD-10-CM

## 2015-11-19 DIAGNOSIS — M5440 Lumbago with sciatica, unspecified side: Secondary | ICD-10-CM | POA: Diagnosis not present

## 2015-11-19 MED ORDER — GABAPENTIN 300 MG PO CAPS: ORAL_CAPSULE | ORAL | 6 refills | Status: DC

## 2015-11-19 NOTE — Telephone Encounter (Signed)
PT called and said she needs a referral for pain management/Dawn CB#[phone]

## 2015-11-19 NOTE — Telephone Encounter (Signed)
Please address tomorrow
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
## 2015-11-19 NOTE — Patient Instructions (Signed)
1. Increase Gabapentin 300mg : take 3 capsules three times a day 2. Minimize intake of any over the counter pain medication to 2-3 times a week, otherwise headaches will not get better 3. Refer back to Pain Management (Dr. Nilsa Nuttingakwa) for continuation of care 4. Continue follow-up with psychiatry 5. Follow-up in 6 months, call for any changes

## 2015-11-20 NOTE — Telephone Encounter (Signed)
LVM referral was sent in this morning and to give me a call if she has any other questions or concerns.

## 2015-11-25 ENCOUNTER — Encounter: Payer: Self-pay | Admitting: Family Medicine

## 2015-11-25 ENCOUNTER — Ambulatory Visit (INDEPENDENT_AMBULATORY_CARE_PROVIDER_SITE_OTHER): Payer: Medicaid Other | Admitting: Family Medicine

## 2015-11-25 VITALS — BP 120/74 | HR 85 | Temp 97.7°F | Resp 16 | Ht 62.0 in | Wt 240.0 lb

## 2015-11-25 DIAGNOSIS — F329 Major depressive disorder, single episode, unspecified: Secondary | ICD-10-CM | POA: Diagnosis not present

## 2015-11-25 DIAGNOSIS — Z23 Encounter for immunization: Secondary | ICD-10-CM | POA: Diagnosis not present

## 2015-11-25 DIAGNOSIS — F410 Panic disorder [episodic paroxysmal anxiety] without agoraphobia: Secondary | ICD-10-CM

## 2015-11-25 DIAGNOSIS — H8110 Benign paroxysmal vertigo, unspecified ear: Secondary | ICD-10-CM

## 2015-11-25 DIAGNOSIS — Z8669 Personal history of other diseases of the nervous system and sense organs: Secondary | ICD-10-CM

## 2015-11-25 DIAGNOSIS — F411 Generalized anxiety disorder: Secondary | ICD-10-CM

## 2015-11-25 DIAGNOSIS — M25569 Pain in unspecified knee: Secondary | ICD-10-CM

## 2015-11-25 DIAGNOSIS — F32A Depression, unspecified: Secondary | ICD-10-CM

## 2015-11-25 MED ORDER — SUMATRIPTAN SUCCINATE 50 MG PO TABS: ORAL_TABLET | ORAL | 0 refills | Status: DC

## 2015-11-25 MED ORDER — SERTRALINE HCL 100 MG PO TABS: 100.0000 mg | ORAL_TABLET | Freq: Every day | ORAL | 2 refills | Status: DC

## 2015-11-25 MED ORDER — DICLOFENAC SODIUM 75 MG PO TBEC: DELAYED_RELEASE_TABLET | ORAL | 3 refills | Status: DC

## 2015-11-25 MED ORDER — MECLIZINE HCL 12.5 MG PO TABS: 12.5000 mg | ORAL_TABLET | Freq: Three times a day (TID) | ORAL | 1 refills | Status: DC | PRN

## 2015-11-25 MED ORDER — BUSPIRONE HCL 5 MG PO TABS: 5.0000 mg | ORAL_TABLET | Freq: Two times a day (BID) | ORAL | 3 refills | Status: DC

## 2015-11-25 MED ORDER — ALPRAZOLAM 0.25 MG PO TABS: 0.2500 mg | ORAL_TABLET | Freq: Two times a day (BID) | ORAL | 0 refills | Status: DC | PRN

## 2015-11-25 NOTE — Patient Instructions (Signed)
Continue current medications Will refer to ortho. Make appt ASAp with mental health.

## 2015-11-25 NOTE — Progress Notes (Signed)
Angela Doyle, is a 30 y.o. female  [id number]  [id number]  DOB - 05/25/1985  CC:  Chief Complaint  Patient presents with  . Edema    both legs   . Knee Pain    left knee pain   . Leg Pain    bilateral        HPI: Angela Doyle is a 30 y.o. female patient of Angela Doyle who presents for follow-up. Her main complaint is pedal edema and bilateral lower leg pain and L knee pain.  She is on Lasix 20 mg daily, which is not keeping under control. She mentions a rash on her right leg which she has recently treated with antifungal cream. She has a history of seizure disorder and is followed by neurology. She also had migraine headache and would like a prescription of Imitrex, which she has used in the past. She experiences frequent vertigo and would like refill on meclizine.  Angela Doyle prescribed Xanax, Buspar and Zoloft and referred to mental health. I notice that she has cancelled he last 3 appoints with mental health. I have explained that we will not continue to prescribe her mental health medication so she needs to get in ASAP.  Health maintenance:We are giving Tdap and flu shot to be given today. She reports a PAP in June 2017. She needs HIV screening but we will do that at next visit and we do not need to do further bloodwork today.  Allergies  Allergen Reactions  . Pineapple Swelling    Oral swelling   Past Medical History:  Diagnosis Date  . Epilepsy (HCC)   . Headache   . Neuropathy (HCC)   . Neuropathy (HCC)   . Seizures (HCC)   . Stroke (HCC)    tia  . Vertigo    Current Outpatient Prescriptions on File Prior to Visit  Medication Sig Dispense Refill  . cyclobenzaprine (FLEXERIL) 10 MG tablet TK 1 T PO Q 8 HRS PRN FOR MUSCLE SPASMS  0  . docusate sodium (COLACE) 100 MG capsule Take 1 capsule (100 mg total) by mouth daily. (Patient taking differently: Take 100 mg by mouth daily as needed for mild constipation. ) 30 capsule 0  . gabapentin (NEURONTIN) 300 MG  capsule Take 3 caps three times daily 270 capsule 6  . ibuprofen (ADVIL,MOTRIN) 600 MG tablet Take 1 tablet (600 mg total) by mouth every 6 (six) hours as needed. (Patient taking differently: Take 1,200 mg by mouth every 6 (six) hours as needed for moderate pain. ) 30 tablet 0  . furosemide (LASIX) 20 MG tablet Take 20 mg by mouth daily.     . ondansetron (ZOFRAN ODT) 4 MG disintegrating tablet Take 1 tablet (4 mg total) by mouth every 8 (eight) hours as needed for nausea or vomiting. (Patient not taking: Reported on 11/25/2015) 10 tablet 0   No current facility-administered medications on file prior to visit.    Family History  Problem Relation Age of Onset  . Neuropathy Mother   . Heart disease Maternal Grandmother   . Diabetes Maternal Grandmother    Social History   Social History  . Marital status: Single    Spouse name: N/A  . Number of children: 1  . Years of education: N/A   Occupational History  . Un employed    Social History Main Topics  . Smoking status: Current Every Day Smoker    Packs/day: 0.25    Types: Cigarettes  . Smokeless tobacco: Never Used  .  Alcohol use 0.0 oz/week     Comment: occ  . Drug use: No  . Sexual activity: Yes   Other Topics Concern  . Not on file   Social History Narrative  . No narrative on file    Review of Systems: Constitutional: Negative Skin: Rash on left thigh HENT: Negative, except for recent nasal congestion Eyes: Negative  Neck: Negative Respiratory: Negative Cardiovascular: Positive for swelling of feet and lower legs Gastrointestinal: Negative Genitourinary: Negative  Musculoskeletal: Chronic pain  Neurological: Negative headaches and history of seizure disorder Hematological: Negative  Psychiatric/Behavioral: Positive for depression and anxiety  Objective:   Vitals:   11/25/15 1009  BP: 120/74  Pulse: 85  Resp: 16  Temp: 97.7 F (36.5 C)    Physical Exam: Constitutional: Patient appears well-developed  and well-nourished. No distress. HENT: Normocephalic, atraumatic, External right and left ear normal. Oropharynx is clear and moist.  Eyes: Conjunctivae and EOM are normal. PERRLA, no scleral icterus. Neck: Normal ROM. Neck supple. No lymphadenopathy, No thyromegaly. CVS: RRR, S1/S2 +, no murmurs, no gallops, no rubs Pulmonary: Effort and breath sounds normal, no stridor, rhonchi, wheezes, rales.  Abdominal: Soft. Normoactive BS,, no distension, tenderness, rebound or guarding.  Musculoskeletal: Normal range of motion. No edema and no tenderness.  Neuro: Alert.Normal muscle tone coordination. Non-focal Skin: Skin is warm and dry. No rash noted. Not diaphoretic. No erythema. No pallor. Psychiatric: Normal mood and affect. Behavior, judgment, thought content normal.  Lab Results  Component Value Date   WBC 4.7 10/01/2015   HGB 14.5 10/01/2015   HCT 42.7 10/01/2015   MCV 93.6 10/01/2015   PLT 214 10/01/2015   Lab Results  Component Value Date   CREATININE 0.73 10/01/2015   BUN 20 10/01/2015   NA 141 10/01/2015   K 4.1 10/01/2015   CL 109 10/01/2015   CO2 26 10/01/2015    Lab Results  Component Value Date   HGBA1C 5.9 (H) 05/28/2015   Lipid Panel     Component Value Date/Time   CHOL 183 05/28/2015 1030   TRIG 104 05/28/2015 1030   HDL 44 (L) 05/28/2015 1030   CHOLHDL 4.2 05/28/2015 1030   VLDL 21 05/28/2015 1030   LDLCALC 118 05/28/2015 1030       Assessment and plan:  7. Encounter for immunization  - Flu Vaccine QUAD 36+ mos IM    2. Knee pain, unspecified laterality  - Ambulatory referral to Orthopedic Surgery  3. Need for Tdap vaccination -Given today. - Tdap vaccine greater than or equal to 7yo IM  4. Depression  - sertraline (ZOLOFT) 100 MG tablet; Take 1 tablet (100 mg total) by mouth daily.  Dispense: 30 tablet; Refill: 2  5. Generalized anxiety disorder  - ALPRAZolam (XANAX) 0.25 MG tablet; Take 1 tablet (0.25 mg total) by mouth 2 (two) times  daily as needed for anxiety.  Dispense: 30 tablet; Refill: 0 - continue buspar. -Make an appointment with mental health ASAP     No Follow-up on file.  The patient was given clear instructions to go to ER or return to medical center if symptoms don't improve, worsen or new problems develop. The patient verbalized understanding.    Henrietta Hoover FNP  11/25/2015, 11:38 AM

## 2015-11-29 ENCOUNTER — Encounter: Payer: Self-pay | Admitting: Neurology

## 2015-12-05 ENCOUNTER — Other Ambulatory Visit: Payer: Self-pay | Admitting: Obstetrics

## 2015-12-05 DIAGNOSIS — N946 Dysmenorrhea, unspecified: Secondary | ICD-10-CM

## 2015-12-07 ENCOUNTER — Other Ambulatory Visit: Payer: Self-pay | Admitting: Obstetrics

## 2015-12-07 DIAGNOSIS — N946 Dysmenorrhea, unspecified: Secondary | ICD-10-CM

## 2015-12-09 ENCOUNTER — Other Ambulatory Visit: Payer: Self-pay | Admitting: Obstetrics

## 2015-12-09 DIAGNOSIS — N946 Dysmenorrhea, unspecified: Secondary | ICD-10-CM

## 2015-12-11 ENCOUNTER — Encounter: Payer: Self-pay | Admitting: Obstetrics

## 2015-12-14 ENCOUNTER — Other Ambulatory Visit: Payer: Self-pay | Admitting: Family Medicine

## 2015-12-14 ENCOUNTER — Other Ambulatory Visit: Payer: Self-pay

## 2015-12-15 ENCOUNTER — Other Ambulatory Visit: Payer: Self-pay

## 2015-12-15 DIAGNOSIS — Z8669 Personal history of other diseases of the nervous system and sense organs: Secondary | ICD-10-CM

## 2015-12-15 MED ORDER — SUMATRIPTAN SUCCINATE 50 MG PO TABS: ORAL_TABLET | ORAL | 0 refills | Status: DC

## 2015-12-15 NOTE — Telephone Encounter (Signed)
Refill for sumatriptan 50mg  sent into pharmacy. Thanks!

## 2015-12-17 ENCOUNTER — Other Ambulatory Visit: Payer: Self-pay | Admitting: Obstetrics

## 2015-12-17 DIAGNOSIS — N946 Dysmenorrhea, unspecified: Secondary | ICD-10-CM

## 2015-12-17 MED ORDER — IBUPROFEN 800 MG PO TABS
800.0000 mg | ORAL_TABLET | Freq: Three times a day (TID) | ORAL | 5 refills | Status: DC | PRN
Start: 2015-12-17 — End: 2016-10-19

## 2015-12-18 ENCOUNTER — Encounter: Payer: Self-pay | Admitting: Family Medicine

## 2015-12-18 ENCOUNTER — Encounter: Payer: Self-pay | Admitting: Neurology

## 2015-12-25 ENCOUNTER — Encounter: Payer: Self-pay | Admitting: Family Medicine

## 2015-12-25 ENCOUNTER — Other Ambulatory Visit: Payer: Self-pay | Admitting: Family Medicine

## 2015-12-25 DIAGNOSIS — F411 Generalized anxiety disorder: Secondary | ICD-10-CM

## 2015-12-25 DIAGNOSIS — F410 Panic disorder [episodic paroxysmal anxiety] without agoraphobia: Secondary | ICD-10-CM

## 2015-12-25 MED ORDER — ALPRAZOLAM 0.25 MG PO TABS: 0.2500 mg | ORAL_TABLET | Freq: Two times a day (BID) | ORAL | 0 refills | Status: DC | PRN

## 2015-12-29 ENCOUNTER — Encounter: Payer: Self-pay | Admitting: Family Medicine

## 2015-12-29 ENCOUNTER — Ambulatory Visit (INDEPENDENT_AMBULATORY_CARE_PROVIDER_SITE_OTHER): Payer: Medicaid Other | Admitting: Family Medicine

## 2015-12-29 DIAGNOSIS — M545 Low back pain, unspecified: Secondary | ICD-10-CM | POA: Insufficient documentation

## 2015-12-29 DIAGNOSIS — M79605 Pain in left leg: Secondary | ICD-10-CM

## 2015-12-29 MED ORDER — NORTRIPTYLINE HCL 25 MG PO CAPS: 25.0000 mg | ORAL_CAPSULE | Freq: Every day | ORAL | 2 refills | Status: DC

## 2015-12-29 NOTE — Progress Notes (Signed)
PCP and consultation requested by: Angela Doyle  Subjective:   HPI: Patient is a 30 y.o. female here for bilateral leg pain.  Patient reports she's had 2-3 years of bilateral leg pain from hips down to toes. Describes pain as throbbing, burning, 8/10. Has tried gabapentin, nabumetone, diclofenac, physical therapy. No bowel/bladder dysfunction.  Past Medical History:  Diagnosis Date  . Epilepsy (HCC)   . Headache   . Neuropathy (HCC)   . Neuropathy (HCC)   . Seizures (HCC)   . Stroke (HCC)    tia  . Vertigo     Current Outpatient Prescriptions on File Prior to Visit  Medication Sig Dispense Refill  . ALPRAZolam (XANAX) 0.25 MG tablet Take 1 tablet (0.25 mg total) by mouth 2 (two) times daily as needed for anxiety. 30 tablet 0  . busPIRone (BUSPAR) 5 MG tablet Take 1 tablet (5 mg total) by mouth 2 (two) times daily. 60 tablet 3  . cyclobenzaprine (FLEXERIL) 10 MG tablet TK 1 T PO Q 8 HRS PRN FOR MUSCLE SPASMS  0  . diclofenac (VOLTAREN) 75 MG EC tablet TK 1 T PO Q 12 HRS PRN FOR INFLAMMATION 30 tablet 3  . docusate sodium (COLACE) 100 MG capsule Take 1 capsule (100 mg total) by mouth daily. (Patient taking differently: Take 100 mg by mouth daily as needed for mild constipation. ) 30 capsule 0  . furosemide (LASIX) 20 MG tablet Take 20 mg by mouth daily.     Marland Kitchen. gabapentin (NEURONTIN) 300 MG capsule Take 3 caps three times daily 270 capsule 6  . ibuprofen (ADVIL,MOTRIN) 600 MG tablet Take 1 tablet (600 mg total) by mouth every 6 (six) hours as needed. (Patient taking differently: Take 1,200 mg by mouth every 6 (six) hours as needed for moderate pain. ) 30 tablet 0  . ibuprofen (ADVIL,MOTRIN) 800 MG tablet Take 1 tablet (800 mg total) by mouth every 8 (eight) hours as needed. 30 tablet 5  . meclizine (ANTIVERT) 12.5 MG tablet Take 1 tablet (12.5 mg total) by mouth 3 (three) times daily as needed for dizziness. 30 tablet 1  . ondansetron (ZOFRAN ODT) 4 MG disintegrating tablet  Take 1 tablet (4 mg total) by mouth every 8 (eight) hours as needed for nausea or vomiting. (Patient not taking: Reported on 11/25/2015) 10 tablet 0  . sertraline (ZOLOFT) 100 MG tablet Take 1 tablet (100 mg total) by mouth daily. 30 tablet 2  . SUMAtriptan (IMITREX) 50 MG tablet May repeat in 2 hours if headache persists or recurs. 10 tablet 0   No current facility-administered medications on file prior to visit.     Past Surgical History:  Procedure Laterality Date  . CESAREAN SECTION  2011  . OVARIAN CYST SURGERY      Allergies  Allergen Reactions  . Pineapple Swelling    Oral swelling    Social History   Social History  . Marital status: Single    Spouse name: N/A  . Number of children: 1  . Years of education: N/A   Occupational History  . Un employed    Social History Main Topics  . Smoking status: Current Every Day Smoker    Packs/day: 0.25    Types: Cigarettes  . Smokeless tobacco: Never Used  . Alcohol use 0.0 oz/week     Comment: occ  . Drug use: No  . Sexual activity: Yes   Other Topics Concern  . Not on file   Social History Narrative  .  No narrative on file    Family History  Problem Relation Age of Onset  . Neuropathy Mother   . Heart disease Maternal Grandmother   . Diabetes Maternal Grandmother     BP 117/81   Pulse 78   Ht 5\' 2"  (1.575 m)   Wt 230 lb (104.3 kg)   BMI 42.07 kg/m   Review of Systems: See HPI above.    Objective:  Physical Exam:  Gen: NAD, comfortable in exam room  Back/bilateral legs: No gross deformity, scoliosis. TTP diffusely across low back and lower extremities. FROM with pain flexion, extension, lateral rotations. Strength LEs 5/5 all muscle groups.   Trace MSRs in patellar and achilles tendons, equal bilaterally. Negative SLRs. Sensation intact to light touch bilaterally. Negative logroll bilateral hips Negative fabers and piriformis stretches.    Assessment & Plan:  1. Low back pain radiating into  both legs - reviewed patient's MRI of lumbar spine and cervical spine.  There are no findings that would explain her symptoms (central disc herniation, cauda equina).  Suspect either neuropathy or somatization disorder as cause of her pain.  She is already on gabapentin - will try nortriptyline at low dose.  Discussed tylenol, aleve/ibuprofen, capsaicin.  She was encouraged to follow up with her neurologist.  Can consider evaluation for fibromyalgia if no etiology found by neurology.  F/u prn.

## 2015-12-29 NOTE — Assessment & Plan Note (Signed)
reviewed patient's MRI of lumbar spine and cervical spine.  There are no findings that would explain her symptoms (central disc herniation, cauda equina).  Suspect either neuropathy or somatization disorder as cause of her pain.  She is already on gabapentin - will try nortriptyline at low dose.  Discussed tylenol, aleve/ibuprofen, capsaicin.  She was encouraged to follow up with her neurologist.  Can consider evaluation for fibromyalgia if no etiology found by neurology.  F/u prn.

## 2015-12-29 NOTE — Patient Instructions (Addendum)
Your history, exam suggest a neuropathy as the cause of your pain. Your MRIs are reassuring - I don't see anything on here that would cause your symptoms. Try the nortriptyline 25mg  at bedtime in addition to your gabapentin. Follow back up with your neurologist - nerve conduction studies are a consideration though it sounds like you've had these before (I cannot see the results of them though). If this is all normal consider seeing someone who specializes in fibromyalgia for an evaluation. These are the different other medicines you can take for pain: Tylenol 500mg  1-2 tabs three times a day. Aleve 2 tabs twice a day with food OR ibuprofen 600mg  three times a day with food for pain and inflammation. Capsaicin topically up to 4 times a day. Gabapentin and the nortriptyline as noted above.

## 2015-12-31 ENCOUNTER — Ambulatory Visit (HOSPITAL_COMMUNITY): Payer: Medicaid Other | Admitting: Psychiatry

## 2016-01-01 ENCOUNTER — Encounter: Payer: Self-pay | Admitting: Neurology

## 2016-01-01 ENCOUNTER — Encounter: Payer: Self-pay | Admitting: Family Medicine

## 2016-01-05 ENCOUNTER — Encounter: Payer: Self-pay | Admitting: Family Medicine

## 2016-01-06 ENCOUNTER — Other Ambulatory Visit: Payer: Self-pay

## 2016-01-06 DIAGNOSIS — Z8669 Personal history of other diseases of the nervous system and sense organs: Secondary | ICD-10-CM

## 2016-01-06 MED ORDER — SUMATRIPTAN SUCCINATE 50 MG PO TABS: ORAL_TABLET | ORAL | 3 refills | Status: DC

## 2016-01-06 NOTE — Telephone Encounter (Signed)
Refill for imitrex sent into pharmacy. Thanks!

## 2016-01-07 ENCOUNTER — Ambulatory Visit: Payer: Medicaid Other | Admitting: Obstetrics

## 2016-01-12 ENCOUNTER — Encounter: Payer: Self-pay | Admitting: Family Medicine

## 2016-01-12 ENCOUNTER — Encounter: Payer: Self-pay | Admitting: Obstetrics

## 2016-01-15 ENCOUNTER — Encounter: Payer: Self-pay | Admitting: Family Medicine

## 2016-01-15 ENCOUNTER — Encounter: Payer: Self-pay | Admitting: Neurology

## 2016-01-15 ENCOUNTER — Encounter: Payer: Self-pay | Admitting: Obstetrics

## 2016-01-25 ENCOUNTER — Ambulatory Visit: Payer: Medicaid Other | Admitting: Obstetrics

## 2016-01-25 ENCOUNTER — Encounter: Payer: Self-pay | Admitting: Neurology

## 2016-01-25 ENCOUNTER — Other Ambulatory Visit: Payer: Self-pay

## 2016-01-25 DIAGNOSIS — G629 Polyneuropathy, unspecified: Secondary | ICD-10-CM

## 2016-01-25 MED ORDER — DICLOFENAC SODIUM 75 MG PO TBEC: DELAYED_RELEASE_TABLET | ORAL | 3 refills | Status: DC

## 2016-01-28 ENCOUNTER — Ambulatory Visit (INDEPENDENT_AMBULATORY_CARE_PROVIDER_SITE_OTHER): Payer: Medicaid Other | Admitting: Neurology

## 2016-01-28 ENCOUNTER — Ambulatory Visit: Payer: Medicaid Other | Admitting: Obstetrics

## 2016-01-28 DIAGNOSIS — G629 Polyneuropathy, unspecified: Secondary | ICD-10-CM

## 2016-01-28 DIAGNOSIS — G5622 Lesion of ulnar nerve, left upper limb: Secondary | ICD-10-CM

## 2016-01-28 NOTE — Procedures (Signed)
[facility]  [address], [address]  [place], [place] [address] Tel: [phone] Fax:  [phone] Test Date:  01/28/2016  Patient: Angela Doyle DOB: 08/21/1985 Physician: Nita Sickleonika Patel, DO  Sex: Female Height: 5\' 2"  Ref Phys: Nita Sickleonika Patel, DO  ID#: [id number] Temp: 33.3C Technician: Judie PetitM. Dean   Patient Complaints: This is a 30 year old female referred for evaluation of bilateral hand numbness and tingling.   NCV & EMG Findings: Extensive electrodiagnostic testing of the right upper extremity and additional studies of the left shows:  1. Bilateral median and mixed palmar sensory responses are within normal limits. Left ulnar sensory response shows reduced amplitude (13.1 V).  Right ulnar sensory response is within normal limits. 2. Left ulnar motor responses at the abductor digiti minimi and first dorsal interosseous shows reduced conduction velocity across the elbow with normal latency and amplitude.  3. There is no evidence of active or chronic motor axon loss changes affecting any of the tested muscles. Motor unit configuration and recruitment pattern is within normal limits.  Impression: 1. Left ulnar neuropathy with slowing across the elbow, purely demyelinating in type. 2. There is no evidence of a diffuse myopathy, cervical radiculopathy, or carpal tunnel syndrome affecting the upper extremities.   ___________________________ Nita Sickleonika Patel, DO    Nerve Conduction Studies Anti Sensory Summary Table   Stim Site NR Peak (ms) Norm Peak (ms) P-T Amp (V) Norm P-T Amp  Left Median Anti Sensory (2nd Digit)  33.3C  Wrist    2.9 <3.3 23.4 >20  Right Median Anti Sensory (2nd Digit)  33.3C  Wrist    2.8 <3.3 28.5 >20  Left Ulnar Anti Sensory (5th Digit)  33.3C  Wrist    2.9 <3.0 13.1 >18  Right Ulnar Anti Sensory (5th Digit)  33.3C  Wrist    2.6 <3.0 23.2 >18   Motor Summary Table   Stim Site NR Onset (ms) Norm Onset (ms) O-P Amp (mV) Norm O-P Amp Site1  Site2 Delta-0 (ms) Dist (cm) Vel (m/s) Norm Vel (m/s)  Left Median Motor (Abd Poll Brev)  33.3C  Wrist    2.7 <3.9 11.4 >6 Elbow Wrist 3.6 22.0 61 >51  Elbow    6.3  11.3         Right Median Motor (Abd Poll Brev)  33.3C  Wrist    2.5 <3.9 15.1 >6 Elbow Wrist 3.8 22.0 58 >51  Elbow    6.3  14.8         Left Ulnar Motor (Abd Dig Minimi)  33.3C  Wrist    2.1 <3.0 8.2 >8 B Elbow Wrist 3.8 22.0 58 >51  B Elbow    5.9  8.1  A Elbow B Elbow 2.4 10.0 42 >51  A Elbow    8.3  7.1         Right Ulnar Motor (Abd Dig Minimi)  33.3C  Wrist    2.1 <3.0 8.5 >8 B Elbow Wrist 4.0 23.0 58 >51  B Elbow    6.1  7.9  A Elbow B Elbow 1.8 10.0 56 >51  A Elbow    7.9  7.1         Left Ulnar (FDI) Motor (1st DI)  33.3C  Wrist    3.0 <3.8 9.4 >8 B Elbow Wrist 3.6 21.0 58 >51  B Elbow    6.6  9.3  A Elbow B Elbow 2.6 10.0 38 >51  A Elbow    9.2  8.4  Comparison Summary Table   Stim Site NR Peak (ms) Norm Peak (ms) P-T Amp (V) Site1 Site2 Delta-P (ms) Norm Delta (ms)  Left Median/Ulnar Palm Comparison (Wrist - 8cm)  33.3C  Median Palm    1.5 <2.2 43.5 Median Palm Ulnar Palm 0.0   Ulnar Palm    1.5 <2.2 5.9                Right Median/Ulnar Palm Comparison (Wrist - 8cm)  32.1C  Median Palm    1.7 <2.2 52.5 Median Palm Ulnar Palm 0.1   Ulnar Palm    1.6 <2.2 13.2       EMG   Side Muscle Ins Act Fibs Psw Fasc Number Recrt Dur Dur. Amp Amp. Poly Poly. Comment  Right 1stDorInt Nml Nml Nml Nml Nml Nml Nml Nml Nml Nml Nml Nml N/A  Right Ext Indicis Nml Nml Nml Nml Nml Nml Nml Nml Nml Nml Nml Nml N/A  Right PronatorTeres Nml Nml Nml Nml Nml Nml Nml Nml Nml Nml Nml Nml N/A  Right Biceps Nml Nml Nml Nml Nml Nml Nml Nml Nml Nml Nml Nml N/A  Left FlexDigProf 4&5 Nml Nml Nml Nml Nml Nml Nml Nml Nml Nml Nml Nml N/A  Right Deltoid Nml Nml Nml Nml Nml Nml Nml Nml Nml Nml Nml Nml N/A  Left 1stDorInt Nml Nml Nml Nml Nml Nml Nml Nml Nml Nml Nml Nml N/A  Left Ext Indicis Nml Nml Nml Nml Nml Nml Nml Nml Nml  Nml Nml Nml N/A  Left PronatorTeres Nml Nml Nml Nml Nml Nml Nml Nml Nml Nml Nml Nml N/A  Left Biceps Nml Nml Nml Nml Nml Nml Nml Nml Nml Nml Nml Nml N/A  Left Triceps Nml Nml Nml Nml Nml Nml Nml Nml Nml Nml Nml Nml N/A  Left Deltoid Nml Nml Nml Nml Nml Nml Nml Nml Nml Nml Nml Nml N/A    Waveforms:

## 2016-02-04 ENCOUNTER — Encounter: Payer: Self-pay | Admitting: Obstetrics

## 2016-02-04 ENCOUNTER — Ambulatory Visit (INDEPENDENT_AMBULATORY_CARE_PROVIDER_SITE_OTHER): Payer: Medicaid Other | Admitting: Obstetrics

## 2016-02-04 VITALS — BP 121/87 | HR 80 | Temp 98.1°F | Wt 224.3 lb

## 2016-02-04 DIAGNOSIS — N760 Acute vaginitis: Secondary | ICD-10-CM | POA: Diagnosis not present

## 2016-02-04 DIAGNOSIS — N83202 Unspecified ovarian cyst, left side: Secondary | ICD-10-CM | POA: Diagnosis not present

## 2016-02-04 DIAGNOSIS — N809 Endometriosis, unspecified: Secondary | ICD-10-CM | POA: Diagnosis not present

## 2016-02-04 DIAGNOSIS — N719 Inflammatory disease of uterus, unspecified: Secondary | ICD-10-CM | POA: Diagnosis not present

## 2016-02-04 DIAGNOSIS — Z30431 Encounter for routine checking of intrauterine contraceptive device: Secondary | ICD-10-CM

## 2016-02-04 DIAGNOSIS — R102 Pelvic and perineal pain: Secondary | ICD-10-CM

## 2016-02-04 DIAGNOSIS — Z975 Presence of (intrauterine) contraceptive device: Secondary | ICD-10-CM

## 2016-02-04 DIAGNOSIS — G8929 Other chronic pain: Secondary | ICD-10-CM | POA: Diagnosis not present

## 2016-02-04 DIAGNOSIS — N761 Subacute and chronic vaginitis: Secondary | ICD-10-CM

## 2016-02-04 DIAGNOSIS — B9689 Other specified bacterial agents as the cause of diseases classified elsewhere: Secondary | ICD-10-CM

## 2016-02-04 MED ORDER — METRONIDAZOLE 500 MG PO TABS: 500.0000 mg | ORAL_TABLET | Freq: Two times a day (BID) | ORAL | 1 refills | Status: DC

## 2016-02-04 MED ORDER — CYCLOBENZAPRINE HCL 10 MG PO TABS: ORAL_TABLET | ORAL | 1 refills | Status: DC

## 2016-02-04 MED ORDER — KETOROLAC TROMETHAMINE 60 MG/2ML IM SOLN
60.0000 mg | Freq: Once | INTRAMUSCULAR | Status: AC
  Administered 2016-02-04: 60 mg via INTRAMUSCULAR

## 2016-02-04 MED ORDER — DOXYCYCLINE HYCLATE 100 MG PO CAPS: 100.0000 mg | ORAL_CAPSULE | Freq: Two times a day (BID) | ORAL | 1 refills | Status: DC

## 2016-02-04 MED ORDER — OXYCODONE-ACETAMINOPHEN 10-325 MG PO TABS: 1.0000 | ORAL_TABLET | Freq: Four times a day (QID) | ORAL | 0 refills | Status: DC | PRN

## 2016-02-04 NOTE — Progress Notes (Signed)
Patient ID: Angela Doyle, female   DOB: 11/26/1985, 30 y.o.   MRN: [id number]  Chief Complaint  Patient presents with  . Follow-up    Pelvic pain    HPI Angela Doyle is a 30 y.o. female.  History of pelvic pain for the past year.  Has an IUD in place.  Ultrasounds have been WNL's, only significant for small simple cyst in left ovary.  Pain is midline suprapubic.  NSAIDS are ineffective.  Pain has been relieved with Flexeril.  Denies N / V, diarrhea / constipation, or dysuria.  HPI  Past Medical History:  Diagnosis Date  . Epilepsy (HCC)   . Headache   . Neuropathy (HCC)   . Neuropathy (HCC)   . Seizures (HCC)   . Stroke (HCC)    tia  . Vertigo     Past Surgical History:  Procedure Laterality Date  . CESAREAN SECTION  2011  . OVARIAN CYST SURGERY      Family History  Problem Relation Age of Onset  . Neuropathy Mother   . Heart disease Maternal Grandmother   . Diabetes Maternal Grandmother     Social History Social History  Substance Use Topics  . Smoking status: Current Every Day Smoker    Packs/day: 0.25    Types: Cigarettes  . Smokeless tobacco: Never Used  . Alcohol use 0.0 oz/week     Comment: occ    Allergies  Allergen Reactions  . Pineapple Swelling    Oral swelling    Current Outpatient Prescriptions  Medication Sig Dispense Refill  . ALPRAZolam (XANAX) 0.25 MG tablet Take 1 tablet (0.25 mg total) by mouth 2 (two) times daily as needed for anxiety. 30 tablet 0  . busPIRone (BUSPAR) 5 MG tablet Take 1 tablet (5 mg total) by mouth 2 (two) times daily. 60 tablet 3  . diclofenac (VOLTAREN) 75 MG EC tablet TK 1 T PO Q 12 HRS PRN FOR INFLAMMATION 30 tablet 3  . docusate sodium (COLACE) 100 MG capsule Take 1 capsule (100 mg total) by mouth daily. (Patient taking differently: Take 100 mg by mouth daily as needed for mild constipation. ) 30 capsule 0  . furosemide (LASIX) 20 MG tablet Take 20 mg by mouth daily.     Marland Kitchen. gabapentin (NEURONTIN) 300 MG capsule  Take 3 caps three times daily 270 capsule 6  . ibuprofen (ADVIL,MOTRIN) 600 MG tablet Take 1 tablet (600 mg total) by mouth every 6 (six) hours as needed. (Patient taking differently: Take 1,200 mg by mouth every 6 (six) hours as needed for moderate pain. ) 30 tablet 0  . ibuprofen (ADVIL,MOTRIN) 800 MG tablet Take 1 tablet (800 mg total) by mouth every 8 (eight) hours as needed. 30 tablet 5  . meclizine (ANTIVERT) 12.5 MG tablet Take 1 tablet (12.5 mg total) by mouth 3 (three) times daily as needed for dizziness. 30 tablet 1  . nortriptyline (PAMELOR) 25 MG capsule Take 1 capsule (25 mg total) by mouth at bedtime. 30 capsule 2  . sertraline (ZOLOFT) 100 MG tablet Take 1 tablet (100 mg total) by mouth daily. 30 tablet 2  . SUMAtriptan (IMITREX) 50 MG tablet May repeat in 2 hours if headache persists or recurs. 10 tablet 3  . cyclobenzaprine (FLEXERIL) 10 MG tablet TK 1 T PO Q 8 HRS PRN FOR MUSCLE SPASMS 30 tablet 1  . doxycycline (VIBRAMYCIN) 100 MG capsule Take 1 capsule (100 mg total) by mouth every 12 (twelve) hours. 28 capsule 1  .  metroNIDAZOLE (FLAGYL) 500 MG tablet Take 1 tablet (500 mg total) by mouth every 12 (twelve) hours. 28 tablet 1  . ondansetron (ZOFRAN ODT) 4 MG disintegrating tablet Take 1 tablet (4 mg total) by mouth every 8 (eight) hours as needed for nausea or vomiting. (Patient not taking: Reported on 02/04/2016) 10 tablet 0  . oxyCODONE-acetaminophen (PERCOCET) 10-325 MG tablet Take 1 tablet by mouth every 6 (six) hours as needed for pain. 30 tablet 0   No current facility-administered medications for this visit.     Review of Systems Review of Systems Constitutional: negative for fatigue and weight loss Respiratory: negative for cough and wheezing Cardiovascular: negative for chest pain, fatigue and palpitations Gastrointestinal: negative for abdominal pain and change in bowel habits Genitourinary:positive for pelvic pain Integument/breast: negative for nipple  discharge Musculoskeletal:negative for myalgias Neurological: negative for gait problems and tremors Behavioral/Psych: negative for abusive relationship, depression Endocrine: negative for temperature intolerance      Blood pressure 121/87, pulse 80, temperature 98.1 F (36.7 C), temperature source Oral, weight 224 lb 4.8 oz (101.7 kg).  Physical Exam Physical Exam General:   alert  Skin:   no rash or abnormalities  Lungs:   clear to auscultation bilaterally  Heart:   regular rate and rhythm, S1, S2 normal, no murmur, click, rub or gallop  Breasts:   normal without suspicious masses, skin or nipple changes or axillary nodes  Abdomen:  normal findings: no organomegaly, soft, non-tender and no hernia  Pelvis:  External genitalia: normal general appearance Urinary system: urethral meatus normal and bladder without fullness, nontender Vaginal: normal without tenderness, induration or masses Cervix: normal appearance Adnexa: normal bimanual exam Uterus: anteverted and tender, normal size    50% of 15 min visit spent on counseling and coordination of care.    Data Reviewed Ultrasound Labs  Assessment     Chronic Pelvic Pain Endometritis BV IUD in place    Plan    Doxycycline / Flagyl x 2 weeks [facility] need [facility], Dr. Lavella Hammockatherine Matthews F/U in 6 weeks  No orders of the defined types were placed in this encounter.  Meds ordered this encounter  Medications  . oxyCODONE-acetaminophen (PERCOCET) 10-325 MG tablet    Sig: Take 1 tablet by mouth every 6 (six) hours as needed for pain.    Dispense:  30 tablet    Refill:  0  . doxycycline (VIBRAMYCIN) 100 MG capsule    Sig: Take 1 capsule (100 mg total) by mouth every 12 (twelve) hours.    Dispense:  28 capsule    Refill:  1  . metroNIDAZOLE (FLAGYL) 500 MG tablet    Sig: Take 1 tablet (500 mg total) by mouth every 12 (twelve) hours.    Dispense:  28 tablet    Refill:  1  . cyclobenzaprine  (FLEXERIL) 10 MG tablet    Sig: TK 1 T PO Q 8 HRS PRN FOR MUSCLE SPASMS    Dispense:  30 tablet    Refill:  1

## 2016-02-05 ENCOUNTER — Ambulatory Visit (INDEPENDENT_AMBULATORY_CARE_PROVIDER_SITE_OTHER): Payer: Medicaid Other | Admitting: Family Medicine

## 2016-02-05 ENCOUNTER — Encounter: Payer: Self-pay | Admitting: Family Medicine

## 2016-02-05 VITALS — BP 114/67 | HR 84 | Temp 97.8°F | Resp 16 | Ht 62.0 in | Wt 227.0 lb

## 2016-02-05 DIAGNOSIS — M545 Low back pain, unspecified: Secondary | ICD-10-CM

## 2016-02-05 DIAGNOSIS — Z8669 Personal history of other diseases of the nervous system and sense organs: Secondary | ICD-10-CM

## 2016-02-05 DIAGNOSIS — Z202 Contact with and (suspected) exposure to infections with a predominantly sexual mode of transmission: Secondary | ICD-10-CM

## 2016-02-05 DIAGNOSIS — R1110 Vomiting, unspecified: Secondary | ICD-10-CM

## 2016-02-05 DIAGNOSIS — R319 Hematuria, unspecified: Secondary | ICD-10-CM | POA: Diagnosis not present

## 2016-02-05 DIAGNOSIS — R1115 Cyclical vomiting syndrome unrelated to migraine: Secondary | ICD-10-CM

## 2016-02-05 LAB — POCT URINALYSIS DIP (DEVICE)
BILIRUBIN URINE: NEGATIVE
Glucose, UA: NEGATIVE mg/dL
Ketones, ur: NEGATIVE mg/dL
NITRITE: NEGATIVE
PH: 5.5 (ref 5.0–8.0)
Protein, ur: NEGATIVE mg/dL
Specific Gravity, Urine: 1.02 (ref 1.005–1.030)
UROBILINOGEN UA: 0.2 mg/dL (ref 0.0–1.0)

## 2016-02-05 MED ORDER — KETOROLAC TROMETHAMINE 60 MG/2ML IM SOLN
60.0000 mg | Freq: Once | INTRAMUSCULAR | Status: AC
  Administered 2016-02-05: 60 mg via INTRAMUSCULAR

## 2016-02-05 MED ORDER — SUMATRIPTAN SUCCINATE 50 MG PO TABS: ORAL_TABLET | ORAL | 3 refills | Status: DC

## 2016-02-05 MED ORDER — PROMETHAZINE HCL 12.5 MG PO TABS: 12.5000 mg | ORAL_TABLET | Freq: Four times a day (QID) | ORAL | 0 refills | Status: DC | PRN

## 2016-02-05 NOTE — Progress Notes (Signed)
Subjective:    Patient ID: Angela Doyle, female    DOB: 06/10/1985, 30 y.o.   MRN: [id number]  Hematuria  This is a new problem. The current episode started today. The problem is unchanged. Her pain is at a severity of 8/10. Irritative symptoms do not include frequency. Obstructive symptoms include incomplete emptying. Associated symptoms include genital pain, hesitancy and vomiting. Pertinent negatives include no abdominal pain, chills, facial swelling or fever.  Emesis   This is a new problem. The current episode started 1 to 4 weeks ago. The problem has been gradually worsening. The emesis has an appearance of stomach contents. There has been no fever. Associated symptoms include headaches. Pertinent negatives include no abdominal pain, chest pain, chills or fever. The treatment provided mild relief.  Back Pain  This is a recurrent problem. The current episode started 1 to 4 weeks ago. The problem occurs constantly. The problem is unchanged. The pain is at a severity of 8/10. Associated symptoms include headaches and numbness. Pertinent negatives include no abdominal pain, chest pain or fever.     Past Medical History:  Diagnosis Date  . Epilepsy (HCC)   . Headache   . Neuropathy (HCC)   . Neuropathy (HCC)   . Seizures (HCC)   . Stroke (HCC)    tia  . Vertigo    Immunization History  Administered Date(s) Administered  . Influenza,inj,Quad PF,36+ Mos 11/25/2015  . Pneumococcal Polysaccharide-23 05/28/2015  . Tdap 11/25/2015    Allergies  Allergen Reactions  . Pineapple Swelling    Oral swelling   Past Surgical History:  Procedure Laterality Date  . CESAREAN SECTION  2011  . OVARIAN CYST SURGERY     Social History   Social History  . Marital status: Single    Spouse name: N/A  . Number of children: 1  . Years of education: N/A   Occupational History  . Un employed    Social History Main Topics  . Smoking status: Current Every Day Smoker    Packs/day: 0.25   Types: Cigarettes  . Smokeless tobacco: Never Used  . Alcohol use 0.0 oz/week     Comment: occ  . Drug use: No  . Sexual activity: Yes   Other Topics Concern  . Not on file   Social History Narrative  . No narrative on file   Review of Systems  Constitutional: Negative for chills, fatigue and fever.  HENT: Negative for congestion and facial swelling.   Eyes: Positive for photophobia. Negative for visual disturbance.  Respiratory: Negative.  Negative for chest tightness, shortness of breath and wheezing.   Cardiovascular: Negative for chest pain and palpitations.  Gastrointestinal: Positive for vomiting. Negative for abdominal pain.  Endocrine: Negative.  Negative for cold intolerance, heat intolerance, polydipsia, polyphagia and polyuria.  Genitourinary: Positive for hematuria, hesitancy and incomplete emptying. Negative for frequency.  Musculoskeletal: Positive for back pain.  Skin: Negative.   Allergic/Immunologic: Negative.   Neurological: Positive for seizures, numbness and headaches.  Hematological: Negative.   Psychiatric/Behavioral: Negative for sleep disturbance and suicidal ideas. The patient is nervous/anxious.        History of depression       Objective:   Physical Exam  Constitutional: She is oriented to person, place, and time. She appears well-developed and well-nourished.  Morbid obesity  HENT:  Head: Normocephalic and atraumatic.  Right Ear: External ear normal.  Left Ear: External ear normal.  Mouth/Throat: Oropharynx is clear and moist.  Eyes: Conjunctivae and EOM are  normal. Pupils are equal, round, and reactive to light.  Neck: Normal range of motion. Neck supple.  Cardiovascular: Normal rate, regular rhythm, normal heart sounds, intact distal pulses and normal pulses.   Pulmonary/Chest: Effort normal and breath sounds normal.  Abdominal: Soft. Bowel sounds are normal.  Musculoskeletal: Normal range of motion.  Neurological: She is alert and  oriented to person, place, and time.  Skin: Skin is warm and dry.  Psychiatric: Her behavior is normal. Judgment and thought content normal. Her mood appears anxious. She exhibits a depressed mood.       BP 114/67 (BP Location: Left Arm, Patient Position: Sitting, Cuff Size: Large)   Pulse 84   Temp 97.8 F (36.6 C) (Oral)   Resp 16   Ht 5\' 2"  (1.575 m)   Wt 227 lb (103 kg)   SpO2 99%   BMI 41.52 kg/m  Assessment & Plan:   1. Acute midline low back pain without sciatica - Urine culture  - ketorolac (TORADOL) injection 60 mg; Inject 2 mLs (60 mg total) into the muscle once.  2. Hematuria, unspecified type Reviewed urinalysis - Urine culture  3. Emesis, persistent - promethazine (PHENERGAN) 12.5 MG tablet; Take 1 tablet (12.5 mg total) by mouth every 6 (six) hours as needed for nausea or vomiting.  Dispense: 30 tablet; Refill: 0 4. Possible exposure to STD - HIV antibody (with reflex) - GC/Chlamydia Probe Amp - RPR  5. History of migraine headaches - SUMAtriptan (IMITREX) 50 MG tablet; May repeat in 2 hours if headache persists or recurs.  Dispense: 10 tablet; Refill: 3  RTC: 3 months 6. Tobacco dependence  Smoking cessation instruction/counseling given:  counseled patient on the dangers of tobacco use, advised patient to stop smoking, and reviewed strategies to maximize success    Jemarion Roycroft M, FNP   The patient was given clear instructions to go to ER or return to medical center if symptoms do not improve, worsen or new problems develop. The patient verbalized understanding. Will notify patient with laboratory results.

## 2016-02-06 LAB — RPR

## 2016-02-06 LAB — HIV ANTIBODY (ROUTINE TESTING W REFLEX): HIV: NONREACTIVE

## 2016-02-07 LAB — URINE CULTURE: ORGANISM ID, BACTERIA: NO GROWTH

## 2016-02-08 ENCOUNTER — Encounter: Payer: Self-pay | Admitting: Family Medicine

## 2016-02-09 ENCOUNTER — Other Ambulatory Visit: Payer: Self-pay | Admitting: Obstetrics

## 2016-02-09 DIAGNOSIS — B373 Candidiasis of vulva and vagina: Secondary | ICD-10-CM

## 2016-02-09 DIAGNOSIS — B3731 Acute candidiasis of vulva and vagina: Secondary | ICD-10-CM

## 2016-02-09 LAB — NUSWAB VG+, CANDIDA 6SP
BVAB 2: HIGH Score — AB
CANDIDA ALBICANS, NAA: POSITIVE — AB
CANDIDA GLABRATA, NAA: NEGATIVE
CANDIDA LUSITANIAE, NAA: NEGATIVE
CANDIDA PARAPSILOSIS, NAA: NEGATIVE
CHLAMYDIA TRACHOMATIS, NAA: NEGATIVE
Candida krusei, NAA: NEGATIVE
Candida tropicalis, NAA: NEGATIVE
Neisseria gonorrhoeae, NAA: NEGATIVE
Trich vag by NAA: NEGATIVE

## 2016-02-09 MED ORDER — FLUCONAZOLE 150 MG PO TABS: 150.0000 mg | ORAL_TABLET | Freq: Once | ORAL | 2 refills | Status: AC

## 2016-02-09 NOTE — Progress Notes (Signed)
Pt notified of results and provider has sent Rx to pharmacy.

## 2016-02-10 ENCOUNTER — Other Ambulatory Visit: Payer: Self-pay | Admitting: Obstetrics

## 2016-02-10 DIAGNOSIS — B3731 Acute candidiasis of vulva and vagina: Secondary | ICD-10-CM

## 2016-02-10 DIAGNOSIS — B373 Candidiasis of vulva and vagina: Secondary | ICD-10-CM

## 2016-02-10 LAB — GC/CHLAMYDIA PROBE AMP

## 2016-02-10 MED ORDER — FLUCONAZOLE 150 MG PO TABS: 150.0000 mg | ORAL_TABLET | Freq: Once | ORAL | 2 refills | Status: AC

## 2016-02-11 ENCOUNTER — Telehealth: Payer: Self-pay

## 2016-02-11 NOTE — Telephone Encounter (Signed)
Contacted patient. Notified her the Physical Health forms she needed filled out Dr. Karel JarvisAquino suggest that she have her PCP to complete. Patient states to leave forms at front desk and she will be back to pick them up.

## 2016-02-15 ENCOUNTER — Encounter: Payer: Self-pay | Admitting: Neurology

## 2016-02-16 ENCOUNTER — Telehealth: Payer: Self-pay

## 2016-02-16 NOTE — Telephone Encounter (Signed)
Contacted patient. She states she has not been under and extra stress. Advised patient if the seizures she reported from yesterday were any different to go to ER per Dr. Karel JarvisAquino. Patient states the seizure were the same but a little stronger. She states she may go to ER because still having some left-sided numbness.

## 2016-02-18 ENCOUNTER — Ambulatory Visit (HOSPITAL_COMMUNITY): Payer: Medicaid Other | Admitting: Psychiatry

## 2016-02-19 ENCOUNTER — Ambulatory Visit: Payer: Medicaid Other | Admitting: Neurology

## 2016-03-01 ENCOUNTER — Encounter: Payer: Self-pay | Admitting: Neurology

## 2016-03-01 ENCOUNTER — Encounter: Payer: Self-pay | Admitting: Family Medicine

## 2016-03-02 ENCOUNTER — Other Ambulatory Visit: Payer: Self-pay | Admitting: Family Medicine

## 2016-03-02 ENCOUNTER — Encounter: Payer: Self-pay | Admitting: Family Medicine

## 2016-03-02 DIAGNOSIS — F411 Generalized anxiety disorder: Secondary | ICD-10-CM

## 2016-03-02 DIAGNOSIS — F410 Panic disorder [episodic paroxysmal anxiety] without agoraphobia: Secondary | ICD-10-CM

## 2016-03-02 NOTE — Telephone Encounter (Signed)
Angela Doyle, Please advise if this can be refilled for patient?

## 2016-03-03 ENCOUNTER — Emergency Department (HOSPITAL_BASED_OUTPATIENT_CLINIC_OR_DEPARTMENT_OTHER)
Admission: EM | Admit: 2016-03-03 | Discharge: 2016-03-03 | Disposition: A | Discharge status: Home / Self Care | Payer: Medicaid Other | Attending: Emergency Medicine | Admitting: Emergency Medicine

## 2016-03-03 ENCOUNTER — Encounter (HOSPITAL_BASED_OUTPATIENT_CLINIC_OR_DEPARTMENT_OTHER): Payer: Self-pay | Admitting: *Deleted

## 2016-03-03 DIAGNOSIS — F17210 Nicotine dependence, cigarettes, uncomplicated: Secondary | ICD-10-CM | POA: Insufficient documentation

## 2016-03-03 DIAGNOSIS — Z79899 Other long term (current) drug therapy: Secondary | ICD-10-CM | POA: Diagnosis not present

## 2016-03-03 DIAGNOSIS — M5442 Lumbago with sciatica, left side: Secondary | ICD-10-CM | POA: Insufficient documentation

## 2016-03-03 DIAGNOSIS — R350 Frequency of micturition: Secondary | ICD-10-CM | POA: Diagnosis not present

## 2016-03-03 DIAGNOSIS — M545 Low back pain: Secondary | ICD-10-CM | POA: Diagnosis present

## 2016-03-03 DIAGNOSIS — Z791 Long term (current) use of non-steroidal anti-inflammatories (NSAID): Secondary | ICD-10-CM | POA: Diagnosis not present

## 2016-03-03 DIAGNOSIS — M5441 Lumbago with sciatica, right side: Secondary | ICD-10-CM | POA: Insufficient documentation

## 2016-03-03 LAB — URINALYSIS, MICROSCOPIC (REFLEX)

## 2016-03-03 LAB — URINALYSIS, ROUTINE W REFLEX MICROSCOPIC
BILIRUBIN URINE: NEGATIVE
Glucose, UA: NEGATIVE mg/dL
HGB URINE DIPSTICK: NEGATIVE
KETONES UR: NEGATIVE mg/dL
NITRITE: NEGATIVE
PH: 5 (ref 5.0–8.0)
Protein, ur: NEGATIVE mg/dL
SPECIFIC GRAVITY, URINE: 1.017 (ref 1.005–1.030)

## 2016-03-03 LAB — PREGNANCY, URINE: PREG TEST UR: NEGATIVE

## 2016-03-03 MED ORDER — NAPROXEN 500 MG PO TABS: 500.0000 mg | ORAL_TABLET | Freq: Two times a day (BID) | ORAL | 0 refills | Status: DC

## 2016-03-03 MED ORDER — ACETAMINOPHEN 325 MG PO TABS
650.0000 mg | ORAL_TABLET | Freq: Once | ORAL | Status: AC
  Administered 2016-03-03: 650 mg via ORAL
  Filled 2016-03-03: qty 2

## 2016-03-03 MED ORDER — METHOCARBAMOL 500 MG PO TABS: 500.0000 mg | ORAL_TABLET | Freq: Two times a day (BID) | ORAL | 0 refills | Status: DC | PRN

## 2016-03-03 MED FILL — METHOCARBAMOL 500 MG TABLET: 500 | 10 days supply | Qty: 20 | Fill #0

## 2016-03-03 MED FILL — NAPROXEN 500 MG TABLET: 500 | 15 days supply | Qty: 30 | Fill #0

## 2016-03-03 NOTE — ED Notes (Signed)
No urine provide by Pt and RN Arline Aspindy informed

## 2016-03-03 NOTE — Telephone Encounter (Signed)
Spoke to patient on 03-01-16 and advised her to come to the ED. Spoke to patient again on 03-02-16 she stated she went to a different ED and had to leave before being seen to pick up son. Advised her to come to ED.

## 2016-03-03 NOTE — ED Provider Notes (Signed)
[facility] Provider Note   CSN: [id number] Arrival date & time: 03/03/16  1100     History   Chief Complaint Chief Complaint  Patient presents with  . Back Pain    HPI Angela Doyle is a 31 y.o. female.  Angela Doyle is a 31 y.o. Female who presents to the emergency department complaining of 1 week of bilateral low back pain that radiates into her bilateral posterior legs. She denies any injury or trauma to her back. She reports her pain is worse with movement and better with lying still. She reports having a history of this previously and believes it was from sciatica. She took one 800 mg ibuprofen earlier today. She also reports some urinary frequency over the past 2 weeks. She denies other urinary symptoms. She denies fevers, injury, fall, numbness, weakness, abdominal pain, nausea, vomiting, diarrhea, dysuria, hematuria, urinary urgency, vaginal bleeding, vaginal discharge or rashes. Patient has an IUD and does not have regular menstrual cycles.   The history is provided by the patient. No language interpreter was used.  Back Pain   Pertinent negatives include no chest pain, no fever, no numbness, no headaches, no abdominal pain, no dysuria, no pelvic pain and no weakness.    Past Medical History:  Diagnosis Date  . Epilepsy (HCC)   . Headache   . Neuropathy (HCC)   . Neuropathy (HCC)   . Seizures (HCC)   . Stroke (HCC)    tia  . Vertigo     Patient Active Problem List   Diagnosis Date Noted  . Low back pain radiating to both legs 12/29/2015  . Convulsions (HCC) 06/08/2015  . Generalized anxiety disorder 06/08/2015  . Chronic pain syndrome 06/08/2015  . Migraine without aura and without status migrainosus, not intractable 06/08/2015  . Nonintractable epilepsy with status epilepticus (HCC) 05/28/2015  . Seizure disorder (HCC) 05/28/2015  . Depression 05/28/2015  . Obstructive sleep apnea 05/28/2015  . Tobacco dependence 05/28/2015  . Neuropathy  [facility](HCC)     Past Surgical History:  Procedure Laterality Date  . CESAREAN SECTION  2011  . OVARIAN CYST SURGERY      OB History    Gravida Para Term Preterm AB Living   2 1 1   1 1    SAB TAB Ectopic Multiple Live Births                   Home Medications    Prior to Admission medications   Medication Sig Start Date End Date Taking? Authorizing Provider  ALPRAZolam (XANAX) 0.25 MG tablet Take 1 tablet (0.25 mg total) by mouth 2 (two) times daily as needed for anxiety. 12/25/15   Henrietta HooverLinda C Bernhardt, NP  busPIRone (BUSPAR) 5 MG tablet Take 1 tablet (5 mg total) by mouth 2 (two) times daily. 11/25/15   Henrietta HooverLinda C Bernhardt, NP  cyclobenzaprine (FLEXERIL) 10 MG tablet TK 1 T PO Q 8 HRS PRN FOR MUSCLE SPASMS 02/04/16   Brock Badharles A Harper, MD  diclofenac (VOLTAREN) 75 MG EC tablet TK 1 T PO Q 12 HRS PRN FOR INFLAMMATION 01/25/16   Henrietta HooverLinda C Bernhardt, NP  docusate sodium (COLACE) 100 MG capsule Take 1 capsule (100 mg total) by mouth daily. Patient taking differently: Take 100 mg by mouth daily as needed for mild constipation.  07/23/15   Massie MaroonLachina M Hollis, FNP  doxycycline (VIBRAMYCIN) 100 MG capsule Take 1 capsule (100 mg total) by mouth every 12 (twelve) hours. 02/04/16   Brock Badharles A Harper, MD  furosemide (LASIX) 20 MG tablet Take 20 mg by mouth daily.     Historical Provider, MD  gabapentin (NEURONTIN) 300 MG capsule Take 3 caps three times daily 11/19/15   Van Clines, MD  ibuprofen (ADVIL,MOTRIN) 800 MG tablet Take 1 tablet (800 mg total) by mouth every 8 (eight) hours as needed. 12/17/15   Brock Bad, MD  meclizine (ANTIVERT) 12.5 MG tablet Take 1 tablet (12.5 mg total) by mouth 3 (three) times daily as needed for dizziness. 11/25/15   Henrietta Hoover, NP  methocarbamol (ROBAXIN) 500 MG tablet Take 1 tablet (500 mg total) by mouth 2 (two) times daily as needed for muscle spasms. 03/03/16   Everlene Farrier, PA-C  metroNIDAZOLE (FLAGYL) 500 MG tablet Take 1 tablet (500 mg total) by mouth every  12 (twelve) hours. 02/04/16   Brock Bad, MD  naproxen (NAPROSYN) 500 MG tablet Take 1 tablet (500 mg total) by mouth 2 (two) times daily with a meal. 03/03/16   Everlene Farrier, PA-C  nortriptyline (PAMELOR) 25 MG capsule Take 1 capsule (25 mg total) by mouth at bedtime. 12/29/15   Lenda Kelp, MD  oxyCODONE-acetaminophen (PERCOCET) 10-325 MG tablet Take 1 tablet by mouth every 6 (six) hours as needed for pain. 02/04/16   Brock Bad, MD  promethazine (PHENERGAN) 12.5 MG tablet Take 1 tablet (12.5 mg total) by mouth every 6 (six) hours as needed for nausea or vomiting. 02/05/16   Massie Maroon, FNP  sertraline (ZOLOFT) 100 MG tablet Take 1 tablet (100 mg total) by mouth daily. 11/25/15   Henrietta Hoover, NP  SUMAtriptan (IMITREX) 50 MG tablet May repeat in 2 hours if headache persists or recurs. 02/05/16   Massie Maroon, FNP    Family History Family History  Problem Relation Age of Onset  . Neuropathy Mother   . Heart disease Maternal Grandmother   . Diabetes Maternal Grandmother     Social History Social History  Substance Use Topics  . Smoking status: Current Every Day Smoker    Packs/day: 0.25    Types: Cigarettes  . Smokeless tobacco: Never Used  . Alcohol use 0.0 oz/week     Comment: occ     Allergies   Pineapple   Review of Systems Review of Systems  Constitutional: Negative for chills and fever.  Respiratory: Negative for cough and shortness of breath.   Cardiovascular: Negative for chest pain.  Gastrointestinal: Negative for abdominal pain, diarrhea, nausea and vomiting.  Genitourinary: Positive for frequency. Negative for difficulty urinating, dysuria, flank pain, hematuria, menstrual problem, pelvic pain, urgency, vaginal bleeding and vaginal discharge.  Musculoskeletal: Positive for back pain. Negative for neck pain.  Skin: Negative for rash.  Neurological: Negative for weakness, numbness and headaches.     Physical Exam Updated Vital  Signs BP 117/75 (BP Location: Right Arm)   Pulse 81   Temp 98.2 F (36.8 C) (Oral)   Resp 20   Ht 5\' 2"  (1.575 m)   Wt 102.5 kg   SpO2 99%   BMI 41.34 kg/m   Physical Exam  Constitutional: She is oriented to person, place, and time. She appears well-developed and well-nourished. No distress.  Nontoxic appearing.  HENT:  Head: Normocephalic and atraumatic.  Eyes: Right eye exhibits no discharge. Left eye exhibits no discharge.  Neck: Neck supple.  Cardiovascular: Normal rate, regular rhythm, normal heart sounds and intact distal pulses.   Pulmonary/Chest: Effort normal and breath sounds normal. No respiratory distress.  Abdominal:  Soft. There is no tenderness. There is no guarding.  Abdomen is soft and nontender to palpation. No CVA or flank tenderness.  Musculoskeletal: Normal range of motion. She exhibits tenderness. She exhibits no edema or deformity.  Tenderness to palpation to her bilateral low back musculature. No midline neck or back tenderness. No back erythema, deformity, ecchymosis or warmth. No calf edema or tenderness. Good strength to her bilateral lower extremities.  Lymphadenopathy:    She has no cervical adenopathy.  Neurological: She is alert and oriented to person, place, and time. She displays normal reflexes. No sensory deficit. Coordination normal.  Normal gait. Bilateral patellar DTRs are intact. Sensation is intact to her bilateral lower extremities.  Skin: Skin is warm and dry. Capillary refill takes less than 2 seconds. No rash noted. She is not diaphoretic. No erythema. No pallor.  Psychiatric: She has a normal mood and affect. Her behavior is normal.  Nursing note and vitals reviewed.    ED Treatments / Results  Labs (all labs ordered are listed, but only abnormal results are displayed) Labs Reviewed  URINALYSIS, ROUTINE W REFLEX MICROSCOPIC - Abnormal; Notable for the following:       Result Value   Leukocytes, UA SMALL (*)    All other components  within normal limits  URINALYSIS, MICROSCOPIC (REFLEX) - Abnormal; Notable for the following:    Bacteria, UA FEW (*)    Squamous Epithelial / LPF 0-5 (*)    All other components within normal limits  URINE CULTURE  PREGNANCY, URINE    EKG  EKG Interpretation None       Radiology No results found.  Procedures Procedures (including critical care time)  Medications Ordered in ED Medications  acetaminophen (TYLENOL) tablet 650 mg (650 mg Oral Given 03/03/16 1219)     Initial Impression / Assessment and Plan / ED Course  I have reviewed the triage vital signs and the nursing notes.  Pertinent labs & imaging results that were available during my care of the patient were reviewed by me and considered in my medical decision making (see chart for details).  Clinical Course    This is a 31 y.o. Female who presents to the emergency department complaining of 1 week of bilateral low back pain that radiates into her bilateral posterior legs. She denies any injury or trauma to her back. She reports her pain is worse with movement and better with lying still. She reports having a history of this previously and believes it was from sciatica. She took one 800 mg ibuprofen earlier today. She also reports some urinary frequency over the past 2 weeks. She denies other urinary symptoms. She denies fevers, injury, fall, numbness, weakness.  Patient with back pain and sciatica. On exam she is afebrile nontoxic appearing.  No neurological deficits and normal neuro exam.  Normal gait.  No loss of bowel or bladder control.  No concern for cauda equina.  No fever, night sweats, weight loss, h/o cancer, IVDU. Urinalysis was obtained due to the patient's reported urinary frequency over 2 weeks. She denies other urinary symptoms. Urinalysis is nitrite negative with only small leukocytes. I low suspicion for UTI. Urine sent for culture. Suspect radicular pain. I discussed back exercises and expected course and  treatment.  I advised the patient to follow-up with their primary care provider this week. I advised the patient to return to the emergency department with new or worsening symptoms or new concerns. The patient verbalized understanding and agreement with plan.  Final Clinical Impressions(s) / ED Diagnoses   Final diagnoses:  Acute bilateral low back pain with bilateral sciatica    New Prescriptions New Prescriptions   METHOCARBAMOL (ROBAXIN) 500 MG TABLET    Take 1 tablet (500 mg total) by mouth 2 (two) times daily as needed for muscle spasms.   NAPROXEN (NAPROSYN) 500 MG TABLET    Take 1 tablet (500 mg total) by mouth 2 (two) times daily with a meal.     Everlene Farrier, PA-C 03/03/16 1404    Maia Plan, MD 03/04/16 1026

## 2016-03-03 NOTE — ED Triage Notes (Signed)
Patient is alert and oriented x4.  She is complaining of lower back pain that started two days ago.  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  Currently she rates her pain 10 of 10.

## 2016-03-05 LAB — URINE CULTURE

## 2016-03-14 ENCOUNTER — Ambulatory Visit: Payer: Medicaid Other | Admitting: Family Medicine

## 2016-03-15 ENCOUNTER — Ambulatory Visit (HOSPITAL_COMMUNITY): Payer: Medicaid Other | Admitting: Psychiatry

## 2016-03-17 ENCOUNTER — Ambulatory Visit: Payer: Medicaid Other | Admitting: Obstetrics

## 2016-03-29 ENCOUNTER — Ambulatory Visit (INDEPENDENT_AMBULATORY_CARE_PROVIDER_SITE_OTHER): Payer: Medicaid Other | Admitting: Family Medicine

## 2016-03-29 ENCOUNTER — Encounter: Payer: Self-pay | Admitting: Family Medicine

## 2016-03-29 VITALS — BP 120/69 | HR 83 | Temp 97.7°F | Resp 18 | Ht 62.0 in | Wt 229.0 lb

## 2016-03-29 DIAGNOSIS — M545 Low back pain, unspecified: Secondary | ICD-10-CM

## 2016-03-29 DIAGNOSIS — G8929 Other chronic pain: Secondary | ICD-10-CM | POA: Diagnosis not present

## 2016-03-29 DIAGNOSIS — R319 Hematuria, unspecified: Secondary | ICD-10-CM | POA: Diagnosis not present

## 2016-03-29 DIAGNOSIS — R300 Dysuria: Secondary | ICD-10-CM

## 2016-03-29 DIAGNOSIS — R7303 Prediabetes: Secondary | ICD-10-CM | POA: Diagnosis not present

## 2016-03-29 DIAGNOSIS — R32 Unspecified urinary incontinence: Secondary | ICD-10-CM

## 2016-03-29 LAB — RENAL FUNCTION PANEL
Albumin: 3.7 g/dL (ref 3.6–5.1)
BUN: 15 mg/dL (ref 7–25)
CHLORIDE: 106 mmol/L (ref 98–110)
CO2: 26 mmol/L (ref 20–31)
CREATININE: 0.96 mg/dL (ref 0.50–1.10)
Calcium: 9.3 mg/dL (ref 8.6–10.2)
GLUCOSE: 97 mg/dL (ref 65–99)
Phosphorus: 4.2 mg/dL (ref 2.5–4.5)
Potassium: 4.3 mmol/L (ref 3.5–5.3)
Sodium: 140 mmol/L (ref 135–146)

## 2016-03-29 LAB — POCT URINALYSIS DIP (DEVICE)
Bilirubin Urine: NEGATIVE
GLUCOSE, UA: NEGATIVE mg/dL
KETONES UR: NEGATIVE mg/dL
Nitrite: NEGATIVE
PH: 5.5 (ref 5.0–8.0)
PROTEIN: NEGATIVE mg/dL
UROBILINOGEN UA: 0.2 mg/dL (ref 0.0–1.0)

## 2016-03-29 LAB — POCT GLYCOSYLATED HEMOGLOBIN (HGB A1C): Hemoglobin A1C: 5.1

## 2016-03-29 MED ORDER — KETOROLAC TROMETHAMINE 60 MG/2ML IM SOLN
60.0000 mg | Freq: Once | INTRAMUSCULAR | Status: AC
  Administered 2016-03-29: 60 mg via INTRAMUSCULAR

## 2016-03-29 NOTE — Progress Notes (Signed)
6   Subjective:    Patient ID: Angela Doyle, female    DOB: 03/01/1985, 31 y.o.   MRN: [id number]  Hematuria  This is a new problem. The current episode started today. The problem is unchanged. She describes the hematuria as gross hematuria. Her pain is at a severity of 8/10. Irritative symptoms do not include frequency or urgency. Obstructive symptoms include incomplete emptying. Associated symptoms include dysuria, genital pain and hesitancy. Pertinent negatives include no abdominal pain, chills, facial swelling, fever, flank pain, nausea or vomiting. There is no history of kidney stones.  Back Pain  This is a recurrent problem. The current episode started 1 to 4 weeks ago. The problem occurs constantly. The problem is unchanged. The pain is at a severity of 8/10. Associated symptoms include dysuria, headaches and numbness. Pertinent negatives include no abdominal pain, chest pain or fever.  Dysuria   This is a recurrent problem. The current episode started more than 1 month ago. The problem has been unchanged. The quality of the pain is described as burning. There has been no fever. She is not sexually active. There is no history of pyelonephritis. Associated symptoms include hematuria and hesitancy. Pertinent negatives include no chills, discharge, flank pain, frequency, nausea, sweats, urgency or vomiting. She has tried nothing for the symptoms. The treatment provided no relief. There is no history of catheterization, kidney stones, recurrent UTIs, a single kidney, urinary stasis or a urological procedure.     Past Medical History:  Diagnosis Date  . Epilepsy (HCC)   . Headache   . Neuropathy (HCC)   . Neuropathy (HCC)   . Seizures (HCC)   . Stroke (HCC)    tia  . Vertigo    Immunization History  Administered Date(s) Administered  . Influenza,inj,Quad PF,36+ Mos 11/25/2015  . Pneumococcal Polysaccharide-23 05/28/2015  . Tdap 11/25/2015    Allergies  Allergen Reactions  .  Pineapple Swelling    Oral swelling   Past Surgical History:  Procedure Laterality Date  . CESAREAN SECTION  2011  . OVARIAN CYST SURGERY     Social History   Social History  . Marital status: Single    Spouse name: N/A  . Number of children: 1  . Years of education: N/A   Occupational History  . Un employed    Social History Main Topics  . Smoking status: Current Every Day Smoker    Packs/day: 0.25    Types: Cigarettes  . Smokeless tobacco: Never Used  . Alcohol use 0.0 oz/week     Comment: occ  . Drug use: No  . Sexual activity: Yes   Other Topics Concern  . Not on file   Social History Narrative  . No narrative on file   Review of Systems  Constitutional: Negative for chills, fatigue and fever.  HENT: Negative for congestion and facial swelling.   Eyes: Positive for photophobia. Negative for visual disturbance.  Respiratory: Negative.  Negative for chest tightness, shortness of breath and wheezing.   Cardiovascular: Negative for chest pain and palpitations.  Gastrointestinal: Negative for abdominal pain, nausea and vomiting.  Endocrine: Negative.  Negative for cold intolerance, heat intolerance, polydipsia, polyphagia and polyuria.  Genitourinary: Positive for dysuria, hematuria, hesitancy and incomplete emptying. Negative for flank pain, frequency and urgency.  Musculoskeletal: Positive for back pain.  Skin: Negative.   Allergic/Immunologic: Negative.   Neurological: Positive for dizziness, seizures, numbness and headaches.  Hematological: Negative.   Psychiatric/Behavioral: Negative for sleep disturbance and suicidal ideas. The patient  is nervous/anxious.        History of depression       Objective:   Physical Exam  Constitutional: She is oriented to person, place, and time. She appears well-developed and well-nourished.  Morbid obesity  HENT:  Head: Normocephalic and atraumatic.  Right Ear: External ear normal.  Left Ear: External ear normal.   Mouth/Throat: Oropharynx is clear and moist.  Eyes: Conjunctivae and EOM are normal. Pupils are equal, round, and reactive to light.  Neck: Normal range of motion. Neck supple.  Cardiovascular: Normal rate, regular rhythm, normal heart sounds, intact distal pulses and normal pulses.   Pulmonary/Chest: Effort normal and breath sounds normal.  Abdominal: Soft. Bowel sounds are normal. There is CVA tenderness (Left).  Musculoskeletal: Normal range of motion.  Neurological: She is alert and oriented to person, place, and time.  Skin: Skin is warm and dry.  Psychiatric: Her behavior is normal. Judgment and thought content normal.       BP 120/69 (BP Location: Left Arm, Patient Position: Sitting, Cuff Size: Large)   Pulse 83   Temp 97.7 F (36.5 C) (Oral)   Resp 18   Ht 5\' 2"  (1.575 m)   Wt 229 lb (103.9 kg)   SpO2 100%   BMI 41.88 kg/m  Assessment & Plan:  1. Prediabetes Recommend a lowfat, low carbohydrate diet divided over 5-6 small meals, increase water intake to 6-8 glasses, and 150 minutes per week of cardiovascular exercise.   - HgB A1c - Ambulatory referral to diabetic education  2. Hematuria, unspecified type - Ambulatory referral to Urology - Renal Function Panel  3. Dysuria - Urine culture - Ambulatory referral to Urology - Renal Function Panel  4. Incontinence of urine in female - Ambulatory referral to Urology - Renal Function Panel  5. Chronic left-sided low back pain without sciatica - ketorolac (TORADOL) injection 60 mg; Inject 2 mLs (60 mg total) into the muscle once.   RTC: 3 months for chronic conditons  Ashawnti Tangen M, FNP    The patient was given clear instructions to go to ER or return to medical center if symptoms do not improve, worsen or new problems develop. The patient verbalized understanding. Will notify patient with laboratory results.

## 2016-03-31 ENCOUNTER — Other Ambulatory Visit: Payer: Self-pay | Admitting: Obstetrics

## 2016-03-31 DIAGNOSIS — G8929 Other chronic pain: Secondary | ICD-10-CM

## 2016-03-31 DIAGNOSIS — R102 Pelvic and perineal pain: Principal | ICD-10-CM

## 2016-03-31 LAB — URINE CULTURE

## 2016-04-01 ENCOUNTER — Other Ambulatory Visit: Payer: Self-pay | Admitting: Family Medicine

## 2016-04-01 DIAGNOSIS — N3001 Acute cystitis with hematuria: Secondary | ICD-10-CM

## 2016-04-01 MED ORDER — SULFAMETHOXAZOLE-TRIMETHOPRIM 800-160 MG PO TABS: 1.0000 | ORAL_TABLET | Freq: Two times a day (BID) | ORAL | 0 refills | Status: AC

## 2016-04-01 NOTE — Progress Notes (Signed)
Meds ordered this encounter  Medications  . sulfamethoxazole-trimethoprim (BACTRIM DS,SEPTRA DS) 800-160 MG tablet    Sig: Take 1 tablet by mouth 2 (two) times daily.    Dispense:  20 tablet    Refill:  0    Silvano Garofano M, FNP

## 2016-04-20 ENCOUNTER — Encounter: Payer: Self-pay | Admitting: Dietician

## 2016-04-20 ENCOUNTER — Other Ambulatory Visit: Payer: Self-pay

## 2016-04-20 ENCOUNTER — Encounter: Payer: Medicaid Other | Attending: Family Medicine | Admitting: Dietician

## 2016-04-20 VITALS — Ht 62.0 in | Wt 227.7 lb

## 2016-04-20 DIAGNOSIS — R7303 Prediabetes: Secondary | ICD-10-CM

## 2016-04-20 DIAGNOSIS — Z713 Dietary counseling and surveillance: Secondary | ICD-10-CM | POA: Insufficient documentation

## 2016-04-20 DIAGNOSIS — F32A Depression, unspecified: Secondary | ICD-10-CM

## 2016-04-20 DIAGNOSIS — F329 Major depressive disorder, single episode, unspecified: Secondary | ICD-10-CM

## 2016-04-20 MED ORDER — FUROSEMIDE 20 MG PO TABS: 20.0000 mg | ORAL_TABLET | Freq: Every day | ORAL | 2 refills | Status: DC

## 2016-04-20 MED ORDER — SERTRALINE HCL 100 MG PO TABS: 100.0000 mg | ORAL_TABLET | Freq: Every day | ORAL | 2 refills | Status: DC

## 2016-04-20 NOTE — Patient Instructions (Signed)
-  Continue to exercise/ walk regularly. Join [employer] within the next month.  - Make a starting "smaller" weight loss goal. Example: 10#, hit that goal, set another weight loss goal. Set goals in manageable increments and don't forget to celebrate each win!  - Think about what you can ADD to your meals rather than take away. Examples: adding a fruit with your sandwich, adding a side salad to a bowl of pasta, adding a fruit with your eggs and toast at breakfast.  -Practice portion control- use your measuring cups as a learning tool.  -Incorporate more protein and fiber sources into your daily meals and snacks to help with hunger.  -Choose whole grain products at least half the time, incorporate whole foods as snack options at least half the time

## 2016-04-20 NOTE — Progress Notes (Signed)
Medical Nutrition Therapy: Visit start time: 1030  end time: 1130  Assessment:  Diagnosis: prediabetes Past medical history: neuropathy, depression, seizure disorder Psychosocial issues/ stress concerns: none Preferred learning method:  . No preference indicated  Current weight: 227.7lb Height: 5\' 2"  Medications, supplements: simvastatin, promethazine, prednisone, colace  Progress and evaluation: Patient desires weight reduction after unsuccessful attempts to lose weight post partum. States weight loss goal of 50-60#. Has tried "dieting" which she describes as eating foods such as salads, fish and chicken and less sweet foods like chocolate. Reports post partum depression and family h/o prediabetes and diabetes. RD made patient aware of positive correlation between weight loss and preventing diabetes. She lives with her daughter who uses baking as the primary cooking method, no frying. Daughter and other immediate family provide a good support system. Requests review of portion sizes for all food groups. Education on portion sizes and translation to building a healthy plate was provided. Patient is willing to practice proper portion control using measuring cups until she can visually portion food without measuring aids. Estimated daily calorie goal was determined for healthy weight reduction. Healthy rate of weight loss discussed, along with setting small achievable goals. Liquid calories and role of exercise in preventing diabetes/ weight loss were also discussed.  Physical activity: Walks though is limited due to leg pain. Tries to go every day. In the process of finding a gym to attend as well.  Dietary Intake:  Usual eating pattern includes 3 meals and 3 snacks per day. Dining out frequency: 2-3 meals per week.  Breakfast: waffle with scrambled eggs and coffee Snack: chips, cookies, chocolate Lunch: salad (chicken) with cantilina dressing, yogurt Snack: same as prior Supper: chicken  parmesean, salmon + rice + vegetables Snack: has been trying to sub usual snacks with healthier options like yogurt Beverages: water, PowerAde in the summer, occasional soda, iced tea sweet and unsweet  Nutrition Care Education: Topics covered: weight reduction, portion sizes, general healthy eating, methods of preventing diabetes Basic nutrition: basic food groups, appropriate nutrient balance, appropriate meal and snack schedule, general nutrition guidelines    Weight control: benefits of weight control, identifying healthy weight, determining reasonable weight goal, behavioral changes for weight loss Other lifestyle changes:  benefits of making changes, readiness for change, identifying habits that need to change  Nutritional Diagnosis:  [place]-2.2 Altered nutrition-related laboratory As related to prediabetes.  As evidenced by HgbA1c 5.1.  Intervention: Discussion as noted above. Nutrition education materials provided.  Education Materials given:  . Game Plan to Prevent Diabetes . Plate Planner . Sample meal pattern/ menus . Goals/ instructions  Learner/ who was taught:  . Patient  Level of understanding: Marland Kitchen. Verbalizes/ demonstrates competency  Demonstrated degree of understanding via:   Teach back Learning barriers: . None  Willingness to learn/ readiness for change: . Eager, change in progress  Monitoring and Evaluation:  Dietary intake, exercise, portion control, and body weight      follow up: prn

## 2016-05-06 ENCOUNTER — Other Ambulatory Visit: Payer: Self-pay

## 2016-05-06 MED ORDER — DICLOFENAC SODIUM 75 MG PO TBEC: DELAYED_RELEASE_TABLET | ORAL | 3 refills | Status: DC

## 2016-05-12 ENCOUNTER — Ambulatory Visit: Payer: Medicaid Other | Admitting: Neurology

## 2016-05-13 ENCOUNTER — Other Ambulatory Visit: Payer: Self-pay | Admitting: Family Medicine

## 2016-05-13 DIAGNOSIS — F329 Major depressive disorder, single episode, unspecified: Secondary | ICD-10-CM

## 2016-05-13 DIAGNOSIS — F32A Depression, unspecified: Secondary | ICD-10-CM

## 2016-06-06 ENCOUNTER — Encounter (HOSPITAL_BASED_OUTPATIENT_CLINIC_OR_DEPARTMENT_OTHER): Payer: Self-pay | Admitting: Emergency Medicine

## 2016-06-06 ENCOUNTER — Emergency Department (HOSPITAL_BASED_OUTPATIENT_CLINIC_OR_DEPARTMENT_OTHER)
Admission: EM | Admit: 2016-06-06 | Discharge: 2016-06-06 | Disposition: A | Discharge status: Home / Self Care | Payer: Medicaid Other | Attending: Emergency Medicine | Admitting: Emergency Medicine

## 2016-06-06 DIAGNOSIS — M5441 Lumbago with sciatica, right side: Secondary | ICD-10-CM | POA: Diagnosis not present

## 2016-06-06 DIAGNOSIS — M545 Low back pain: Secondary | ICD-10-CM | POA: Diagnosis present

## 2016-06-06 DIAGNOSIS — Z79899 Other long term (current) drug therapy: Secondary | ICD-10-CM | POA: Insufficient documentation

## 2016-06-06 DIAGNOSIS — F17210 Nicotine dependence, cigarettes, uncomplicated: Secondary | ICD-10-CM | POA: Insufficient documentation

## 2016-06-06 DIAGNOSIS — M5442 Lumbago with sciatica, left side: Secondary | ICD-10-CM | POA: Insufficient documentation

## 2016-06-06 DIAGNOSIS — G8929 Other chronic pain: Secondary | ICD-10-CM | POA: Insufficient documentation

## 2016-06-06 DIAGNOSIS — M5440 Lumbago with sciatica, unspecified side: Secondary | ICD-10-CM

## 2016-06-06 HISTORY — DX: Major depressive disorder, single episode, unspecified: F32.9

## 2016-06-06 HISTORY — DX: Other chronic pain: G89.29

## 2016-06-06 HISTORY — DX: Depression, unspecified: F32.A

## 2016-06-06 HISTORY — DX: Dorsalgia, unspecified: M54.9

## 2016-06-06 MED ORDER — OXYCODONE-ACETAMINOPHEN 5-325 MG PO TABS: 1.0000 | ORAL_TABLET | ORAL | 0 refills | Status: DC | PRN

## 2016-06-06 MED ORDER — MORPHINE SULFATE (PF) 4 MG/ML IV SOLN
6.0000 mg | Freq: Once | INTRAVENOUS | Status: AC
  Administered 2016-06-06: 6 mg via INTRAMUSCULAR
  Filled 2016-06-06: qty 2

## 2016-06-06 MED ORDER — DEXAMETHASONE SODIUM PHOSPHATE 10 MG/ML IJ SOLN
10.0000 mg | Freq: Once | INTRAMUSCULAR | Status: AC
  Administered 2016-06-06: 10 mg via INTRAMUSCULAR
  Filled 2016-06-06: qty 1

## 2016-06-06 MED FILL — OXYCODONE/APAP 5/325 MG TAB: 5-325 | 2 days supply | Qty: 10 | Fill #0

## 2016-06-06 NOTE — ED Triage Notes (Signed)
Low back pain x 1 week, radiating down R leg, hx of same, takes lyrica and muscle relaxers.

## 2016-06-06 NOTE — ED Provider Notes (Signed)
[facility] Provider Note   CSN: [id number] Arrival date & time: 06/06/16  1332  By signing my name below, I, Modena Jansky, attest that this documentation has been prepared under the direction and in the presence of Rolan Bucco, MD. Electronically Signed: Modena Jansky, Scribe. 06/06/2016. 4:02 PM.  History   Chief Complaint Chief Complaint  Patient presents with  . Back Pain   The history is provided by the patient. No language interpreter was used.   HPI Comments: Angela Doyle is a 31 y.o. female with a PMHx of Arthritis and Neuropathy who presents to the Emergency Department complaining of constant moderate lower back pain that started about a month ago. She states she has had the same pain intermittently over the past few years. She came to the ED due to worsening pain. No known injury. Her pain radiates into the BLE. She reports associated feet numbness. Denies any bowel/bladder incontinence or other complaints at this time.      PCP: Massie Maroon, FNP  Past Medical History:  Diagnosis Date  . Chronic back pain   . Depression   . Epilepsy (HCC)   . Headache   . Neuropathy (HCC)   . Neuropathy (HCC)   . Seizures (HCC)   . Stroke (HCC)    tia  . Vertigo     Patient Active Problem List   Diagnosis Date Noted  . Low back pain radiating to both legs 12/29/2015  . Convulsions (HCC) 06/08/2015  . Generalized anxiety disorder 06/08/2015  . Chronic pain syndrome 06/08/2015  . Migraine without aura and without status migrainosus, not intractable 06/08/2015  . Nonintractable epilepsy with status epilepticus (HCC) 05/28/2015  . Seizure disorder (HCC) 05/28/2015  . Depression 05/28/2015  . Obstructive sleep apnea 05/28/2015  . Tobacco dependence 05/28/2015  . Neuropathy [facility])     Past Surgical History:  Procedure Laterality Date  . CESAREAN SECTION  2011  . OVARIAN CYST SURGERY      OB History    Gravida Para Term Preterm AB Living   SAB TAB Ectopic Multiple Live Births                   Home Medications    Prior to Admission medications   Medication Sig Start Date End Date Taking? Authorizing Provider  furosemide (LASIX) 20 MG tablet Take 1 tablet (20 mg total) by mouth daily. 04/20/16  Yes Massie Maroon, FNP  pregabalin (LYRICA) 100 MG capsule Take 100 mg by mouth 2 (two) times daily.   Yes Historical Provider, MD  sertraline (ZOLOFT) 100 MG tablet TAKE 1 TABLET(100 MG) BY MOUTH DAILY 05/13/16  Yes Massie Maroon, FNP  tizanidine (ZANAFLEX) 2 MG capsule Take 2 mg by mouth 3 (three) times daily.   Yes Historical Provider, MD  ALPRAZolam (XANAX) 0.25 MG tablet Take 1 tablet (0.25 mg total) by mouth 2 (two) times daily as needed for anxiety. Patient not taking: Reported on 03/29/2016 12/25/15   Henrietta Hoover, NP  busPIRone (BUSPAR) 5 MG tablet Take 1 tablet (5 mg total) by mouth 2 (two) times daily. 11/25/15   Henrietta Hoover, NP  cyclobenzaprine (FLEXERIL) 10 MG tablet TK 1 T PO Q 8 HRS PRN FOR MUSCLE SPASMS 02/04/16   Brock Bad, MD  diclofenac (VOLTAREN) 75 MG EC tablet TK 1 T PO Q 12 HRS PRN FOR INFLAMMATION 05/06/16   Henrietta Hoover, NP  docusate  sodium (COLACE) 100 MG capsule Take 1 capsule (100 mg total) by mouth daily. Patient taking differently: Take 100 mg by mouth daily as needed for mild constipation.  07/23/15   Massie Maroon, FNP  doxycycline (VIBRAMYCIN) 100 MG capsule Take 1 capsule (100 mg total) by mouth every 12 (twelve) hours. Patient not taking: Reported on 03/29/2016 02/04/16   Brock Bad, MD  gabapentin (NEURONTIN) 300 MG capsule Take 3 caps three times daily Patient not taking: Reported on 03/29/2016 11/19/15   Van Clines, MD  ibuprofen (ADVIL,MOTRIN) 800 MG tablet Take 1 tablet (800 mg total) by mouth every 8 (eight) hours as needed. 12/17/15   Brock Bad, MD  meclizine (ANTIVERT) 12.5 MG tablet Take 1 tablet (12.5 mg total) by mouth 3 (three) times daily as needed  for dizziness. Patient not taking: Reported on 03/29/2016 11/25/15   Henrietta Hoover, NP  methocarbamol (ROBAXIN) 500 MG tablet Take 1 tablet (500 mg total) by mouth 2 (two) times daily as needed for muscle spasms. Patient not taking: Reported on 03/29/2016 03/03/16   Everlene Farrier, PA-C  naproxen (NAPROSYN) 500 MG tablet Take 1 tablet (500 mg total) by mouth 2 (two) times daily with a meal. Patient not taking: Reported on 03/29/2016 03/03/16   Everlene Farrier, PA-C  nortriptyline (PAMELOR) 25 MG capsule Take 1 capsule (25 mg total) by mouth at bedtime. 12/29/15   Lenda Kelp, MD  oxyCODONE-acetaminophen (PERCOCET) 5-325 MG tablet Take 1-2 tablets by mouth every 4 (four) hours as needed. 06/06/16   Rolan Bucco, MD  predniSONE (STERAPRED UNI-PAK 21 TAB) 10 MG (21) TBPK tablet See admin instructions. 03/11/16   Historical Provider, MD  pregabalin (LYRICA) 50 MG capsule Take 1 tablet in the evening. 04/07/16   Historical Provider, MD  promethazine (PHENERGAN) 12.5 MG tablet Take 1 tablet (12.5 mg total) by mouth every 6 (six) hours as needed for nausea or vomiting. 02/05/16   Massie Maroon, FNP  sertraline (ZOLOFT) 100 MG tablet Take 1 tablet (100 mg total) by mouth daily. 04/20/16   Massie Maroon, FNP  SUMAtriptan (IMITREX) 50 MG tablet May repeat in 2 hours if headache persists or recurs. 02/05/16   Massie Maroon, FNP    Family History Family History  Problem Relation Age of Onset  . Neuropathy Mother   . Heart disease Maternal Grandmother   . Diabetes Maternal Grandmother     Social History Social History  Substance Use Topics  . Smoking status: Current Every Day Smoker    Packs/day: 0.25    Types: Cigarettes  . Smokeless tobacco: Never Used  . Alcohol use 0.0 oz/week     Comment: occ     Allergies   Pineapple   Review of Systems Review of Systems  Constitutional: Negative for fever.  Gastrointestinal: Negative for nausea and vomiting.  Musculoskeletal: Positive for back  pain (Lower). Negative for arthralgias, joint swelling and neck pain.  Skin: Negative for wound.  Neurological: Positive for numbness (Feet). Negative for weakness and headaches.     Physical Exam Updated Vital Signs BP (!) 149/95 (BP Location: Right Arm)   Pulse 64   Temp 98 F (36.7 C) (Oral)   Resp 16   Ht  (1.575 m)   Wt 230 lb (104.3 kg)   SpO2 100%   BMI 42.07 kg/m   Physical Exam  Constitutional: She is oriented to person, place, and time. She appears well-developed and well-nourished.  HENT:  Head: Normocephalic  and atraumatic.  Neck: Normal range of motion. Neck supple.  Cardiovascular: Normal rate.   Pulmonary/Chest: Effort normal.  Musculoskeletal: She exhibits edema and tenderness.  TTP to the lower lumbar spine and paraspinal areas bilaterally. +SLR bilaterally. Patellar reflexes symmetric bilaterally. Normal motor function and senasiton. Pedal pulses intact.   Neurological: She is alert and oriented to person, place, and time.  Skin: Skin is warm and dry.  Psychiatric: She has a normal mood and affect.     ED Treatments / Results  DIAGNOSTIC STUDIES: Oxygen Saturation is 100% on RA, normal by my interpretation.    COORDINATION OF CARE: 4:06 PM- Pt advised of plan for treatment and pt agrees.  Labs (all labs ordered are listed, but only abnormal results are displayed) Labs Reviewed - No data to display  EKG  EKG Interpretation None       Radiology No results found.  Procedures Procedures (including critical care time)  Medications Ordered in ED Medications  dexamethasone (DECADRON) injection 10 mg (not administered)  morphine 4 MG/ML injection 6 mg (not administered)     Initial Impression / Assessment and Plan / ED Course  I have reviewed the triage vital signs and the nursing notes.  Pertinent labs & imaging results that were available during my care of the patient were reviewed by me and considered in my medical decision making  (see chart for details).     Patient presents with exacerbation of her chronic low back pain. She's had a recent MRI in February. She's followed by a sports medicine physician in [facility]. She's neurologically intact. There is no suggestions of cauda equina. She was given a shot of Decadron in the ED and a shot of morphine. She will be discharged with Percocet tablets #10 and encouraged to follow-up with her orthopedist.  Final Clinical Impressions(s) / ED Diagnoses   Final diagnoses:  Acute bilateral low back pain with sciatica, sciatica laterality unspecified    New Prescriptions New Prescriptions   OXYCODONE-ACETAMINOPHEN (PERCOCET) 5-325 MG TABLET    Take 1-2 tablets by mouth every 4 (four) hours as needed.   I personally performed the services described in this documentation, which was scribed in my presence.  The recorded information has been reviewed and considered.     Rolan Bucco, MD 06/06/16 [phone]

## 2016-06-06 NOTE — ED Notes (Signed)
Patient claimed that it hurts when she move her BLE.

## 2016-06-27 ENCOUNTER — Encounter: Payer: Self-pay | Admitting: Family Medicine

## 2016-06-27 ENCOUNTER — Ambulatory Visit (INDEPENDENT_AMBULATORY_CARE_PROVIDER_SITE_OTHER): Payer: Medicaid Other | Admitting: Obstetrics

## 2016-06-27 ENCOUNTER — Other Ambulatory Visit (HOSPITAL_COMMUNITY)
Admission: RE | Admit: 2016-06-27 | Discharge: 2016-06-27 | Disposition: A | Discharge status: Home / Self Care | Payer: Medicaid Other | Source: Ambulatory Visit | Attending: Obstetrics | Admitting: Obstetrics

## 2016-06-27 ENCOUNTER — Encounter: Payer: Self-pay | Admitting: Obstetrics

## 2016-06-27 ENCOUNTER — Ambulatory Visit (INDEPENDENT_AMBULATORY_CARE_PROVIDER_SITE_OTHER): Payer: Medicaid Other | Admitting: Family Medicine

## 2016-06-27 VITALS — BP 144/74 | HR 80 | Wt 232.0 lb

## 2016-06-27 VITALS — BP 115/68 | HR 65 | Temp 98.3°F | Resp 16 | Ht 62.0 in | Wt 231.0 lb

## 2016-06-27 DIAGNOSIS — N643 Galactorrhea not associated with childbirth: Secondary | ICD-10-CM | POA: Diagnosis not present

## 2016-06-27 DIAGNOSIS — F411 Generalized anxiety disorder: Secondary | ICD-10-CM | POA: Diagnosis not present

## 2016-06-27 DIAGNOSIS — Z30432 Encounter for removal of intrauterine contraceptive device: Secondary | ICD-10-CM | POA: Diagnosis not present

## 2016-06-27 DIAGNOSIS — B9689 Other specified bacterial agents as the cause of diseases classified elsewhere: Secondary | ICD-10-CM | POA: Diagnosis not present

## 2016-06-27 DIAGNOSIS — R102 Pelvic and perineal pain: Secondary | ICD-10-CM

## 2016-06-27 DIAGNOSIS — F17200 Nicotine dependence, unspecified, uncomplicated: Secondary | ICD-10-CM | POA: Diagnosis not present

## 2016-06-27 DIAGNOSIS — N644 Mastodynia: Secondary | ICD-10-CM

## 2016-06-27 DIAGNOSIS — G8929 Other chronic pain: Secondary | ICD-10-CM

## 2016-06-27 DIAGNOSIS — B373 Candidiasis of vulva and vagina: Secondary | ICD-10-CM | POA: Insufficient documentation

## 2016-06-27 DIAGNOSIS — Z3169 Encounter for other general counseling and advice on procreation: Secondary | ICD-10-CM

## 2016-06-27 DIAGNOSIS — N760 Acute vaginitis: Secondary | ICD-10-CM | POA: Insufficient documentation

## 2016-06-27 DIAGNOSIS — Z3202 Encounter for pregnancy test, result negative: Secondary | ICD-10-CM | POA: Diagnosis not present

## 2016-06-27 DIAGNOSIS — F410 Panic disorder [episodic paroxysmal anxiety] without agoraphobia: Secondary | ICD-10-CM

## 2016-06-27 MED ORDER — BUSPIRONE HCL 10 MG PO TABS: 5.0000 mg | ORAL_TABLET | Freq: Two times a day (BID) | ORAL | 5 refills | Status: DC

## 2016-06-27 NOTE — Patient Instructions (Addendum)
Breast Tenderness Breast tenderness is a common problem for women of all ages. Breast tenderness may cause mild discomfort to severe pain. The pain usually comes and goes in association with your menstrual cycle, but it can be constant. Breast tenderness has many possible causes, including hormone changes and some medicines. Your health care provider may order tests, such as a mammogram or an ultrasound, to check for any unusual findings. Having breast tenderness usually does not mean that you have breast cancer. Follow these instructions at home: Sometimes, reassurance that you do not have breast cancer is all that is needed. In general, follow these home care instructions: Managing pain and discomfort   If directed, apply ice to the area:  Put ice in a plastic bag.  Place a towel between your skin and the bag.  Leave the ice on for 20 minutes, 2-3 times a day.  Make sure you are wearing a supportive bra, especially during exercise. You may also want to wear a supportive bra while sleeping if your breasts are very tender. Medicines   Take over-the-counter and prescription medicines only as told by your health care provider. If the cause of your pain is infection, you may be prescribed an antibiotic medicine.  If you were prescribed an antibiotic, take it as told by your health care provider. Do not stop taking the antibiotic even if you start to feel better. General instructions   Your health care provider may recommend that you reduce the amount of fat in your diet. You can do this by:  Limiting fried foods.  Cooking foods using methods, such as baking, boiling, grilling, and broiling.  Decrease the amount of caffeine in your diet. You can do this by drinking more water and choosing caffeine-free options.  Keep a log of the days and times when your breasts are most tender.  Ask your health care provider how to do breast exams at home. This will help you notice if you have an unusual  growth or lump. Contact a health care provider if:  Any part of your breast is hard, red, and hot to the touch. This may be a sign of infection.  You are not breastfeeding and you have fluid, especially blood or pus, coming out of your nipples.  You have a fever.  You have a new or painful lump in your breast that remains after your menstrual period ends.  Your pain does not improve or it gets worse.  Your pain is interfering with your daily activities. This information is not intended to replace advice given to you by your health care provider. Make sure you discuss any questions you have with your health care provider. Document Released: 01/28/2008 Document Revised: 11/13/2015 Document Reviewed: 11/13/2015 Elsevier Interactive Patient Education  2017 [employer] Galactorrhea Galactorrhea is an abnormal milky discharge from the breast. The discharge may come from one or both nipples. The fluid is often white, yellow, or green. It is different from the normal milk produced in nursing mothers. Galactorrhea usually occurs in women, but it can sometimes affect men. Various things can cause galactorrhea. It is often caused by irritation of the breast, which can result from injury, stimulation during sexual activity, or clothes rubbing against the nipple. It may also be related to medicines or changes in hormone levels. In many cases, galactorrhea will go away without treatment. However, galactorrhea can also be a sign of something more serious, such as diseases of the kidney or thyroid or problems with the pituitary  gland. Your health care provider may do various tests to help determine the cause. Sometimes the cause is unknown. It is important to monitor your condition to make sure that it goes away. Follow these instructions at home: Watch your condition for any changes. The following actions may help to lessen any discomfort that you are feeling:  Take medicines only as directed by your  health care provider.  Do not squeeze your breasts or nipples.  Avoid breast stimulation during sexual activity.  Perform a breast self-exam once a month. Doing this more often can irritate your breasts.  Avoid clothes that rub on your nipples.  Use breast pads to absorb the discharge.  Wear a breast binder or a support bra to help prevent clothes from rubbing on your nipples.  Keep all follow-up visits as directed by your health care provider. This is important. Contact a health care provider if:  You develop hot flashes, vaginal dryness, or a lack of sexual desire.  You stop having menstrual periods, or they are irregular or far apart.  You have headaches.  You have vision problems. Get help right away if:  You have breast discharge that is bloody or puslike.  You have breast pain.  You feel a lump in your breast.  You have wrinkling or dimpling on your breast.  Your breast becomes red and swollen. This information is not intended to replace advice given to you by your health care provider. Make sure you discuss any questions you have with your health care provider. Document Released: 03/24/2004 Document Revised: 07/23/2015 Document Reviewed: 09/17/2013 Elsevier Interactive Patient Education  2017 [employer].  Preparing for Pregnancy If you are considering becoming pregnant, make an appointment to see your regular health care provider to learn how to prepare for a safe and healthy pregnancy (preconception care). During a preconception care visit, your health care provider will:  Do a complete physical exam, including a Pap test.  Take a complete medical history.  Give you information, answer your questions, and help you resolve problems. Preconception checklist Medical history  Tell your health care provider about any current or past medical conditions. Your pregnancy or your ability to become pregnant may be affected by chronic conditions, such as diabetes,  chronic hypertension, and thyroid problems.  Include your family's medical history as well as your partner's medical history.  Tell your health care provider about any history of STIs (sexually transmitted infections).These can affect your pregnancy. In some cases, they can be passed to your baby. Discuss any concerns that you have about STIs.  If indicated, discuss the benefits of genetic testing. This testing will show whether there are any genetic conditions that may be passed from you or your partner to your baby.  Tell your health care provider about:  Any problems you have had with conception or pregnancy.  Any medicines you take. These include vitamins, herbal supplements, and over-the-counter medicines.  Your history of immunizations. Discuss any vaccinations that you may need. Diet  Ask your health care provider what to include in a healthy diet that has a balance of nutrients. This is especially important when you are pregnant or preparing to become pregnant.  Ask your health care provider to help you reach a healthy weight before pregnancy.  If you are overweight, you may be at higher risk for certain complications, such as high blood pressure, diabetes, and preterm birth.  If you are underweight, you are more likely to have a baby who has a  low birth weight. Lifestyle, work, and home  Let your health care provider know:  About any lifestyle habits that you have, such as alcohol use, drug use, or smoking.  About recreational activities that may put you at risk during pregnancy, such as downhill skiing and certain exercise programs.  Tell your health care provider about any international travel, especially any travel to places with an active [place] virus outbreak.  About harmful substances that you may be exposed to at work or at home. These include chemicals, pesticides, radiation, or even litter boxes.  If you do not feel safe at home. Mental health  Tell your health  care provider about:  Any history of mental health conditions, including feelings of depression, sadness, or anxiety.  Any medicines that you take for a mental health condition. These include herbs and supplements. Home instructions to prepare for pregnancy  Lifestyle  Eat a balanced diet. This includes fresh fruits and vegetables, whole grains, lean meats, low-fat dairy products, healthy fats, and foods that are high in fiber. Ask to meet with a nutritionist or registered dietitian for assistance with meal planning and goals.  Get regular exercise. Try to be active for at least 30 minutes a day on most days of the week. Ask your health care provider which activities are safe during pregnancy.  Do not use any products that contain nicotine or tobacco, such as cigarettes and e-cigarettes. If you need help quitting, ask your health care provider.  Do not drink alcohol.  Do not take illegal drugs.  Maintain a healthy weight. Ask your health care provider what weight range is right for you. General instructions  Keep an accurate record of your menstrual periods. This makes it easier for your health care provider to determine your baby's due date.  Begin taking prenatal vitamins and folic acid supplements daily as directed by your health care provider.  Manage any chronic conditions, such as high blood pressure and diabetes, as told by your health care provider. This is important. How do I know that I am pregnant? You may be pregnant if you have been sexually active and you miss your period. Symptoms of early pregnancy include:  Mild cramping.  Very light vaginal bleeding (spotting).  Feeling unusually tired.  Nausea and vomiting (morning sickness). If you have any of these symptoms and you suspect that you might be pregnant, you can take a home pregnancy test. These tests check for a hormone in your urine (human chorionic gonadotropin, or hCG). A woman's body begins to make this  hormone during early pregnancy. These tests are very accurate. Wait until at least the first day after you miss your period to take one. If the test shows that you are pregnant (you get a positive result), call your health care provider to make an appointment for prenatal care. What should I do if I become pregnant?  Make an appointment with your health care provider as soon as you suspect you are pregnant.  Do not use any products that contain nicotine, such as cigarettes, chewing tobacco, and e-cigarettes. If you need help quitting, ask your health care provider.  Do not drink alcoholic beverages. Alcohol is related to a number of birth defects.  Avoid toxic odors and chemicals.  You may continue to have sexual intercourse if it does not cause pain or other problems, such as vaginal bleeding. This information is not intended to replace advice given to you by your health care provider. Make sure you discuss any  questions you have with your health care provider. Document Released: 01/28/2008 Document Revised: 10/13/2015 Document Reviewed: 09/06/2015 Elsevier Interactive Patient Education  2017 [employer].

## 2016-06-27 NOTE — Patient Instructions (Addendum)
Panic Attacks Panic attacks are sudden, short-livedsurges of severe anxiety, fear, or discomfort. They may occur for no reason when you are relaxed, when you are anxious, or when you are sleeping. Panic attacks may occur for a number of reasons:  Healthy people occasionally have panic attacks in extreme, life-threatening situations, such as war or natural disasters. Normal anxiety is a protective mechanism of the body that helps us react to danger (fight or flight response).  Panic attacks are often seen with anxiety disorders, such as panic disorder, social anxiety disorder, generalized anxiety disorder, and phobias. Anxiety disorders cause excessive or uncontrollable anxiety. They may interfere with your relationships or other life activities.  Panic attacks are sometimes seen with other mental illnesses, such as depression and posttraumatic stress disorder.  Certain medical conditions, prescription medicines, and drugs of abuse can cause panic attacks. What are the signs or symptoms? Panic attacks start suddenly, peak within 20 minutes, and are accompanied by four or more of the following symptoms:  Pounding heart or fast heart rate (palpitations).  Sweating.  Trembling or shaking.  Shortness of breath or feeling smothered.  Feeling choked.  Chest pain or discomfort.  Nausea or strange feeling in your stomach.  Dizziness, light-headedness, or feeling like you will faint.  Chills or hot flushes.  Numbness or tingling in your lips or hands and feet.  Feeling that things are not real or feeling that you are not yourself.  Fear of losing control or going crazy.  Fear of dying. Some of these symptoms can mimic serious medical conditions. For example, you may think you are having a heart attack. Although panic attacks can be very scary, they are not life threatening. How is this diagnosed? Panic attacks are diagnosed through an assessment by your health care provider. Your  health care provider will ask questions about your symptoms, such as where and when they occurred. Your health care provider will also ask about your medical history and use of alcohol and drugs, including prescription medicines. Your health care provider may order blood tests or other studies to rule out a serious medical condition. Your health care provider may refer you to a mental health professional for further evaluation. How is this treated?  Most healthy people who have one or two panic attacks in an extreme, life-threatening situation will not require treatment.  The treatment for panic attacks associated with anxiety disorders or other mental illness typically involves counseling with a mental health professional, medicine, or a combination of both. Your health care provider will help determine what treatment is best for you.  Panic attacks due to physical illness usually go away with treatment of the illness. If prescription medicine is causing panic attacks, talk with your health care provider about stopping the medicine, decreasing the dose, or substituting another medicine.  Panic attacks due to alcohol or drug abuse go away with abstinence. Some adults need professional help in order to stop drinking or using drugs. Follow these instructions at home:  Take all medicines as directed by your health care provider.  Schedule and attend follow-up visits as directed by your health care provider. It is important to keep all your appointments. Contact a health care provider if:  You are not able to take your medicines as prescribed.  Your symptoms do not improve or get worse. Get help right away if:  You experience panic attack symptoms that are different than your usual symptoms.  You have serious thoughts about hurting yourself or others.    You are taking medicine for panic attacks and have a serious side effect. This information is not intended to replace advice given to you by  your health care provider. Make sure you discuss any questions you have with your health care provider. Document Released: 02/14/2005 Document Revised: 07/23/2015 Document Reviewed: 09/28/2012 Elsevier Interactive Patient Education  2017 [employer]  Coping with Quitting Smoking Quitting smoking is a physical and mental challenge. You will face cravings, withdrawal symptoms, and temptation. Before quitting, work with your health care provider to make a plan that can help you cope. Preparation can help you quit and keep you from giving in. How can I cope with cravings? Cravings usually last for 5-10 minutes. If you get through it, the craving will pass. Consider taking the following actions to help you cope with cravings:  Keep your mouth busy:  Chew sugar-free gum.  Suck on hard candies or a straw.  Brush your teeth.  Keep your hands and body busy:  Immediately change to a different activity when you feel a craving.  Squeeze or play with a ball.  Do an activity or a hobby, like making bead jewelry, practicing needlepoint, or working with wood.  Mix up your normal routine.  Take a short exercise break. Go for a quick walk or run up and down stairs.  Spend time in public places where smoking is not allowed.  Focus on doing something kind or helpful for someone else.  Call a friend or family member to talk during a craving.  Join a support group.  Call a quit line, such as 1-800-QUIT-NOW.  Talk with your health care provider about medicines that might help you cope with cravings and make quitting easier for you. How can I deal with withdrawal symptoms? Your body may experience negative effects as it tries to get used to not having nicotine in the system. These effects are called withdrawal symptoms. They may include:  Feeling hungrier than normal.  Trouble concentrating.  Irritability.  Trouble sleeping.  Feeling depressed.  Restlessness and agitation.  Craving  a cigarette. 1.  To manage withdrawal symptoms:  Avoid places, people, and activities that trigger your cravings.  Remember why you want to quit.  Get plenty of sleep.  Avoid coffee and other caffeinated drinks. These may worsen some of your symptoms. How can I handle social situations? Social situations can be difficult when you are quitting smoking, especially in the first few weeks. To manage this, you can:  Avoid parties, bars, and other social situations where people might be smoking.  Avoid alcohol.  Leave right away if you have the urge to smoke.  Explain to your family and friends that you are quitting smoking. Ask for understanding and support.  Plan activities with friends or family where smoking is not an option. What are some ways I can cope with stress? Wanting to smoke may cause stress, and stress can make you want to smoke. Find ways to manage your stress. Relaxation techniques can help. For example:  Breathe slowly and deeply, in through your nose and out through your mouth.  Listen to soothing, relaxing music.  Talk with a family member or friend about your stress.  Light a candle.  Soak in a bath or take a shower.  Think about a peaceful place. What are some ways I can prevent weight gain? Be aware that many people gain weight after they quit smoking. However, not everyone does. To keep from gaining weight, have a plan  in place before you quit and stick to the plan after you quit. Your plan should include:  Having healthy snacks. When you have a craving, it may help to:  Eat plain popcorn, crunchy carrots, celery, or other cut vegetables.  Chew sugar-free gum.  Changing how you eat:  Eat small portion sizes at meals.  Eat 4-6 small meals throughout the day instead of 1-2 large meals a day.  Be mindful when you eat. Do not watch television or do other things that might distract you as you eat.  Exercising regularly:  Make time to exercise each  day. If you do not have time for a long workout, do short bouts of exercise for 5-10 minutes several times a day.  Do some form of strengthening exercise, like weight lifting, and some form of aerobic exercise, like running or swimming.  Drinking plenty of water or other low-calorie or no-calorie drinks. Drink 6-8 glasses of water daily, or as much as instructed by your health care provider. Summary  Quitting smoking is a physical and mental challenge. You will face cravings, withdrawal symptoms, and temptation to smoke again. Preparation can help you as you go through these challenges.  You can cope with cravings by keeping your mouth busy (such as by chewing gum), keeping your body and hands busy, and making calls to family, friends, or a helpline for people who want to quit smoking.  You can cope with withdrawal symptoms by avoiding places where people smoke, avoiding drinks with caffeine, and getting plenty of rest.  Ask your health care provider about the different ways to prevent weight gain, avoid stress, and handle social situations. This information is not intended to replace advice given to you by your health care provider. Make sure you discuss any questions you have with your health care provider. Document Released: 02/12/2016 Document Revised: 02/12/2016 Document Reviewed: 02/12/2016 Elsevier Interactive Patient Education  2017 [employer].

## 2016-06-27 NOTE — Progress Notes (Signed)
Subjective:    Patient ID: Angela Doyle, female    DOB: [birth year], 31 y.o.   MRN: [id number]  Ms. Angela Doyle, a 31 year old female with a history of panic attacks, anxiety, seizure disorder, and chronic cystitis presents for a follow up of anxiety and panic attacks. She says that she is compliant with medication regimen most of the time. She says that her last panic attack was in February 2018.    Anxiety  Presents for follow-up visit. Symptoms include decreased concentration, excessive worry, irritability, nervous/anxious behavior and panic. Patient reports no chest pain, compulsions, confusion, depressed mood, dizziness, dry mouth, feeling of choking, hyperventilation, impotence, insomnia, malaise, nausea, palpitations, shortness of breath or suicidal ideas. Symptoms occur most days. The severity of symptoms is moderate. The quality of sleep is fair. Nighttime awakenings: occasional.   Compliance with medications is 51-75%.     Past Medical History:  Diagnosis Date  . Chronic back pain   . Depression   . Epilepsy (HCC)   . Headache   . Neuropathy   . Neuropathy   . Seizures (HCC)   . Stroke (HCC)    tia  . Vertigo    Social History   Social History  . Marital status: Single    Spouse name: N/A  . Number of children: 1  . Years of education: N/A   Occupational History  . Un employed    Social History Main Topics  . Smoking status: Current Every Day Smoker    Packs/day: 0.25    Types: Cigarettes  . Smokeless tobacco: Never Used     Comment: 4-5 per day   . Alcohol use 0.0 oz/week     Comment: occ  . Drug use: No  . Sexual activity: Yes    Birth control/ protection: IUD   Other Topics Concern  . Not on file   Social History Narrative  . No narrative on file   Review of Systems  Constitutional: Positive for irritability.  Respiratory: Negative for shortness of breath.   Cardiovascular: Negative for chest pain and palpitations.  Gastrointestinal:  Negative for nausea.  Genitourinary: Negative for impotence.  Musculoskeletal: Positive for back pain.  Neurological: Positive for seizures (History of epilepsy). Negative for dizziness.  Psychiatric/Behavioral: Positive for decreased concentration. Negative for confusion and suicidal ideas. The patient is nervous/anxious. The patient does not have insomnia.        Objective:   Physical Exam  Constitutional: She appears well-developed and well-nourished.  HENT:  Head: Normocephalic and atraumatic.  Right Ear: External ear normal.  Left Ear: External ear normal.  Nose: Nose normal.  Mouth/Throat: Oropharynx is clear and moist.  Eyes: Conjunctivae and EOM are normal. Pupils are equal, round, and reactive to light.  Neck: Normal range of motion. Neck supple.  Cardiovascular: Normal rate, regular rhythm, normal heart sounds and intact distal pulses.   Pulmonary/Chest: Effort normal and breath sounds normal.  Abdominal: Soft. Bowel sounds are normal. She exhibits no distension. There is no tenderness.  Musculoskeletal: Normal range of motion.  Neurological: She is alert. She has normal reflexes.  Skin: Skin is warm.  Psychiatric: She has a normal mood and affect. Her behavior is normal. Judgment and thought content normal. Her mood appears not anxious. She does not exhibit a depressed mood.      BP 115/68 (BP Location: Left Arm, Patient Position: Sitting, Cuff Size: Large)   Pulse 65   Temp 98.3 F (36.8 C) (Oral)   Resp 16  Ht  (1.575 m)   Wt 231 lb (104.8 kg)   SpO2 100%   BMI 42.25 kg/m  Assessment & Plan:  1. Generalized anxiety disorder GAD 7 : Generalized Anxiety Score 06/27/2016 07/23/2015  Nervous, Anxious, on Edge 0 2  Control/stop worrying 1 2  Worry too much - different things 1 2  Trouble relaxing 2 3  Restless 0 2  Easily annoyed or irritable 2 2  Afraid - awful might happen 1 2  Total GAD 7 Score 7 15  Anxiety Difficulty - Somewhat difficult   Will  increase Buspar to 10 mg BID.  - Ambulatory referral to Psychiatry  2. Panic attacks  - busPIRone (BUSPAR) 10 MG tablet; Take 0.5 tablets (5 mg total) by mouth 2 (two) times daily.  Dispense: 60 tablet; Refill: 5 - Ambulatory referral to Psychiatry  3. Tobacco dependence Smoking cessation instruction/counseling given:  counseled patient on the dangers of tobacco use, advised patient to stop smoking, and reviewed strategies to maximize success    RTC: 3 months for chronic conditions   Marry Kusch Rennis Petty  MSN, FNP-C [facility] [address] [place], [place] [address] [phone]

## 2016-06-27 NOTE — Progress Notes (Signed)
Patient ID: Angela Doyle, female   DOB: 1985-04-27, 31 y.o.   MRN: [id number]  Chief Complaint  Patient presents with  . Gynecologic Exam    HPI Angela Doyle is a 31 y.o. female.  History of chronic pelvic pain.  IUD in place and patient treated for endometritis.  Pelvic pain has resolved after treatment.  Has had bilateral breast tenderness for the past month and also breasts are leaking milk.  Denies HA or visual changes.  Requests removal of IUD.  Desires conception.  HPI  Past Medical History:  Diagnosis Date  . Chronic back pain   . Depression   . Epilepsy (HCC)   . Headache   . Neuropathy   . Neuropathy   . Seizures (HCC)   . Stroke (HCC)    tia  . Vertigo     Past Surgical History:  Procedure Laterality Date  . CESAREAN SECTION  2011  . OVARIAN CYST SURGERY      Family History  Problem Relation Age of Onset  . Neuropathy Mother   . Heart disease Maternal Grandmother   . Diabetes Maternal Grandmother     Social History Social History  Substance Use Topics  . Smoking status: Current Every Day Smoker    Packs/day: 0.25    Types: Cigarettes  . Smokeless tobacco: Never Used     Comment: 4-5 per day   . Alcohol use 0.0 oz/week     Comment: occ    Allergies  Allergen Reactions  . Pineapple Swelling    Oral swelling    Current Outpatient Prescriptions  Medication Sig Dispense Refill  . busPIRone (BUSPAR) 10 MG tablet Take 0.5 tablets (5 mg total) by mouth 2 (two) times daily. 60 tablet 5  . diclofenac (VOLTAREN) 75 MG EC tablet TK 1 T PO Q 12 HRS PRN FOR INFLAMMATION 30 tablet 3  . docusate sodium (COLACE) 100 MG capsule Take 1 capsule (100 mg total) by mouth daily. (Patient taking differently: Take 100 mg by mouth daily as needed for mild constipation. ) 30 capsule 0  . furosemide (LASIX) 20 MG tablet Take 1 tablet (20 mg total) by mouth daily. 30 tablet 2  . ibuprofen (ADVIL,MOTRIN) 800 MG tablet Take 1 tablet (800 mg total) by mouth every 8 (eight)  hours as needed. 30 tablet 5  . naproxen (NAPROSYN) 500 MG tablet Take 1 tablet (500 mg total) by mouth 2 (two) times daily with a meal. 30 tablet 0  . oxyCODONE (OXY IR/ROXICODONE) 5 MG immediate release tablet Take 5 mg by mouth 2 (two) times daily.    . pregabalin (LYRICA) 100 MG capsule Take 100 mg by mouth 2 (two) times daily.    . promethazine (PHENERGAN) 12.5 MG tablet Take 1 tablet (12.5 mg total) by mouth every 6 (six) hours as needed for nausea or vomiting. 30 tablet 0  . sertraline (ZOLOFT) 100 MG tablet Take 1 tablet (100 mg total) by mouth daily. 30 tablet 2  . SUMAtriptan (IMITREX) 50 MG tablet May repeat in 2 hours if headache persists or recurs. 10 tablet 3  . tizanidine (ZANAFLEX) 2 MG capsule Take 2 mg by mouth 3 (three) times daily.    . cyclobenzaprine (FLEXERIL) 10 MG tablet TK 1 T PO Q 8 HRS PRN FOR MUSCLE SPASMS (Patient not taking: Reported on 06/27/2016) 30 tablet 1   No current facility-administered medications for this visit.     Review of Systems Review of Systems Constitutional: negative for fatigue and weight  loss Respiratory: negative for cough and wheezing Cardiovascular: negative for chest pain, fatigue and palpitations Gastrointestinal: negative for abdominal pain and change in bowel habits Genitourinary:negative Integument/breast: positive for nipple discharge and breast tenderness Musculoskeletal:negative for myalgias Neurological: negative for gait problems and tremors Behavioral/Psych: negative for abusive relationship, depression Endocrine: negative for temperature intolerance      Blood pressure (!) 144/74, pulse 80, weight 232 lb (105.2 kg).  Physical Exam Physical Exam General:   alert  Skin:   no rash or abnormalities  Lungs:   clear to auscultation bilaterally  Heart:   regular rate and rhythm, S1, S2 normal, no murmur, click, rub or gallop  Breasts:   normal without suspicious masses, skin or nipple changes or axillary nodes  Abdomen:   normal findings: no organomegaly, soft, non-tender and no hernia  Pelvis:  External genitalia: normal general appearance Urinary system: urethral meatus normal and bladder without fullness, nontender Vaginal: normal without tenderness, induration or masses Cervix: normal appearance.  IUD string grasped with long dressing forceps and removed, intact  Adnexa: normal bimanual exam Uterus: anteverted and non-tender, normal size    50% of 25 min visit spent on counseling and coordination of care.    Data Reviewed Labs Ultrasound  Assessment     Chronic pelvic pain - resolved after p.o. Doxy / Flagyl for presumed endometritis Bilateral mastodynia Galactorrhea IUD Removal Preconception consultation    Plan    1. Labs:      Prolactinoma work-up  2. [facility] to [facility]  3. IUD removed  4. Folic Acid Rx and Preconception Consultation done  5. Follow up in 3 months for Annual  Orders Placed This Encounter  Procedures  . MM Digital Diagnostic Bilat    Standing Status:   Future    Standing Expiration Date:   08/27/2017    Order Specific Question:   Reason for Exam (SYMPTOM  OR DIAGNOSIS REQUIRED)    Answer:   Bilateral mastodynia.  Galactorrhea    Order Specific Question:   Is the patient pregnant?    Answer:   No    Order Specific Question:   Preferred imaging location?    Answer:   [facility]  . Hemoglobin A1c  . TSH  . Comprehensive metabolic panel  . CBC  . Prolactin  . POCT urine pregnancy   No orders of the defined types were placed in this encounter.         Patient ID: Angela Doyle, female   DOB: Jan 29, 1986, 29 y.o.   MRN: [id number]

## 2016-06-28 ENCOUNTER — Encounter: Payer: Self-pay | Admitting: Obstetrics

## 2016-06-28 LAB — COMPREHENSIVE METABOLIC PANEL
A/G RATIO: 1.7 (ref 1.2–2.2)
ALK PHOS: 71 IU/L (ref 39–117)
ALT: 32 IU/L (ref 0–32)
AST: 21 IU/L (ref 0–40)
Albumin: 4.3 g/dL (ref 3.5–5.5)
BUN/Creatinine Ratio: 18 (ref 9–23)
BUN: 15 mg/dL (ref 6–20)
CHLORIDE: 103 mmol/L (ref 96–106)
CO2: 23 mmol/L (ref 18–29)
Calcium: 8.9 mg/dL (ref 8.7–10.2)
Creatinine, Ser: 0.82 mg/dL (ref 0.57–1.00)
GFR calc Af Amer: 113 mL/min/{1.73_m2} (ref >59)
GFR calc non Af Amer: 98 mL/min/{1.73_m2} (ref >59)
GLUCOSE: 105 mg/dL — AB (ref 65–99)
Globulin, Total: 2.5 g/dL (ref 1.5–4.5)
POTASSIUM: 4.2 mmol/L (ref 3.5–5.2)
Sodium: 141 mmol/L (ref 134–144)
Total Protein: 6.8 g/dL (ref 6.0–8.5)

## 2016-06-28 LAB — CBC
HEMATOCRIT: 43 % (ref 34.0–46.6)
HEMOGLOBIN: 14.2 g/dL (ref 11.1–15.9)
MCH: 31.3 pg (ref 26.6–33.0)
MCHC: 33 g/dL (ref 31.5–35.7)
MCV: 95 fL (ref 79–97)
Platelets: 224 10*3/uL (ref 150–379)
RBC: 4.53 x10E6/uL (ref 3.77–5.28)
RDW: 13.7 % (ref 12.3–15.4)
WBC: 6.1 10*3/uL (ref 3.4–10.8)

## 2016-06-28 LAB — TSH: TSH: 1.87 u[IU]/mL (ref 0.450–4.500)

## 2016-06-28 LAB — HEMOGLOBIN A1C
ESTIMATED AVERAGE GLUCOSE: 108 mg/dL
HEMOGLOBIN A1C: 5.4 % (ref 4.8–5.6)

## 2016-06-28 LAB — PROLACTIN: PROLACTIN: 8.9 ng/mL (ref 4.8–23.3)

## 2016-06-28 LAB — POCT URINE PREGNANCY: PREG TEST UR: NEGATIVE

## 2016-06-28 MED ORDER — PRENATE PIXIE 10-0.6-0.4-200 MG PO CAPS: 1.0000 | ORAL_CAPSULE | Freq: Every day | ORAL | 11 refills | Status: DC

## 2016-06-28 NOTE — Addendum Note (Signed)
Addended by: Coral Ceo A on: 06/28/2016 11:48 AM   Modules accepted: Orders

## 2016-06-30 ENCOUNTER — Other Ambulatory Visit: Payer: Self-pay | Admitting: Family Medicine

## 2016-06-30 ENCOUNTER — Other Ambulatory Visit: Payer: Self-pay

## 2016-06-30 ENCOUNTER — Other Ambulatory Visit: Payer: Self-pay | Admitting: Obstetrics

## 2016-06-30 DIAGNOSIS — F32A Depression, unspecified: Secondary | ICD-10-CM

## 2016-06-30 DIAGNOSIS — N644 Mastodynia: Secondary | ICD-10-CM

## 2016-06-30 DIAGNOSIS — N643 Galactorrhea not associated with childbirth: Secondary | ICD-10-CM

## 2016-06-30 DIAGNOSIS — F329 Major depressive disorder, single episode, unspecified: Secondary | ICD-10-CM

## 2016-06-30 LAB — CERVICOVAGINAL ANCILLARY ONLY
BACTERIAL VAGINITIS: POSITIVE — AB
CANDIDA VAGINITIS: POSITIVE — AB
CHLAMYDIA, DNA PROBE: NEGATIVE
Neisseria Gonorrhea: NEGATIVE
Trichomonas: NEGATIVE

## 2016-07-01 ENCOUNTER — Other Ambulatory Visit: Payer: Self-pay | Admitting: Obstetrics

## 2016-07-01 DIAGNOSIS — N760 Acute vaginitis: Principal | ICD-10-CM

## 2016-07-01 DIAGNOSIS — B9689 Other specified bacterial agents as the cause of diseases classified elsewhere: Secondary | ICD-10-CM

## 2016-07-01 DIAGNOSIS — B373 Candidiasis of vulva and vagina: Secondary | ICD-10-CM

## 2016-07-01 DIAGNOSIS — B3731 Acute candidiasis of vulva and vagina: Secondary | ICD-10-CM

## 2016-07-01 MED ORDER — METRONIDAZOLE 500 MG PO TABS: 500.0000 mg | ORAL_TABLET | Freq: Two times a day (BID) | ORAL | 2 refills | Status: DC

## 2016-07-01 MED ORDER — FLUCONAZOLE 150 MG PO TABS: 150.0000 mg | ORAL_TABLET | Freq: Once | ORAL | 0 refills | Status: AC

## 2016-07-02 ENCOUNTER — Encounter: Payer: Self-pay | Admitting: Obstetrics

## 2016-07-11 ENCOUNTER — Other Ambulatory Visit: Payer: Self-pay

## 2016-07-11 DIAGNOSIS — R1115 Cyclical vomiting syndrome unrelated to migraine: Secondary | ICD-10-CM

## 2016-07-11 MED ORDER — PROMETHAZINE HCL 12.5 MG PO TABS: 12.5000 mg | ORAL_TABLET | Freq: Four times a day (QID) | ORAL | 0 refills | Status: DC | PRN

## 2016-07-11 NOTE — Telephone Encounter (Signed)
Refill for promethazine sent into pharmacy. Thanks!

## 2016-07-19 ENCOUNTER — Encounter: Payer: Self-pay | Admitting: Obstetrics

## 2016-07-19 ENCOUNTER — Ambulatory Visit
Admission: RE | Admit: 2016-07-19 | Discharge: 2016-07-19 | Disposition: A | Discharge status: Home / Self Care | Payer: Medicaid Other | Source: Ambulatory Visit | Attending: Obstetrics | Admitting: Obstetrics

## 2016-07-19 ENCOUNTER — Encounter: Payer: Self-pay | Admitting: Radiology

## 2016-07-19 ENCOUNTER — Other Ambulatory Visit: Payer: Self-pay | Admitting: Family Medicine

## 2016-07-19 DIAGNOSIS — N644 Mastodynia: Secondary | ICD-10-CM

## 2016-07-19 DIAGNOSIS — N643 Galactorrhea not associated with childbirth: Secondary | ICD-10-CM

## 2016-07-19 DIAGNOSIS — R1115 Cyclical vomiting syndrome unrelated to migraine: Secondary | ICD-10-CM

## 2016-07-19 DIAGNOSIS — F32A Depression, unspecified: Secondary | ICD-10-CM

## 2016-07-19 DIAGNOSIS — F329 Major depressive disorder, single episode, unspecified: Secondary | ICD-10-CM

## 2016-07-19 MED ORDER — SERTRALINE HCL 100 MG PO TABS: 100.0000 mg | ORAL_TABLET | Freq: Every day | ORAL | 3 refills | Status: DC

## 2016-07-19 MED ORDER — PROMETHAZINE HCL 12.5 MG PO TABS: 12.5000 mg | ORAL_TABLET | Freq: Four times a day (QID) | ORAL | 0 refills | Status: DC | PRN

## 2016-07-19 NOTE — Telephone Encounter (Signed)
Refill has been sent to pharmacy. Thanks

## 2016-07-24 IMAGING — US US TRANSVAGINAL NON-OB
1 series · 13 of 25 positions shown · non-contrast
Comparison: Ultrasound dated 05/16/2015

CLINICAL DATA: 29-year-old female with left lower quadrant pain

EXAM:
TRANSABDOMINAL AND TRANSVAGINAL ULTRASOUND OF PELVIS
DOPPLER ULTRASOUND OF OVARIES
TECHNIQUE: Both transabdominal and transvaginal ultrasound examinations of the
pelvis were performed. Transabdominal technique was performed for
global imaging of the pelvis including uterus, ovaries, adnexal
regions, and pelvic cul-de-sac.
It was necessary to proceed with endovaginal exam following the
transabdominal exam to visualize the endometrium and the ovaries.
Color and duplex Doppler ultrasound was utilized to evaluate blood
flow to the ovaries.

[Series 1: us transvaginal non-ob · 0.21mm/px · 81 acquisitions, 13 frames shown]
[im 1/81]
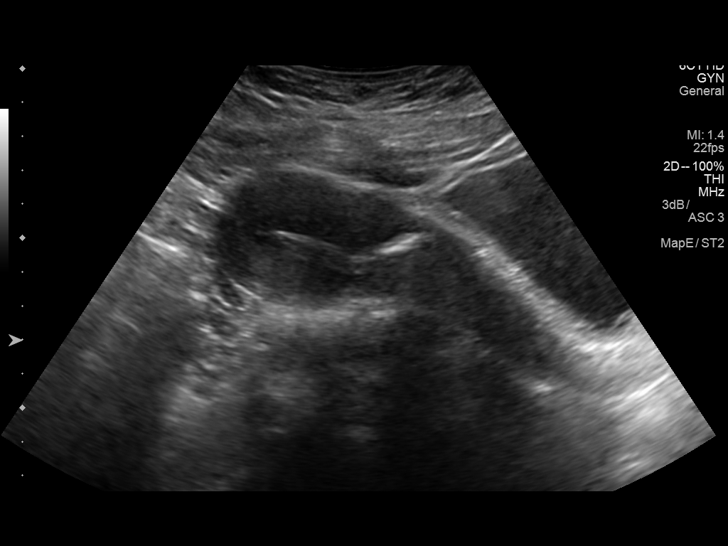
[im 7/81]
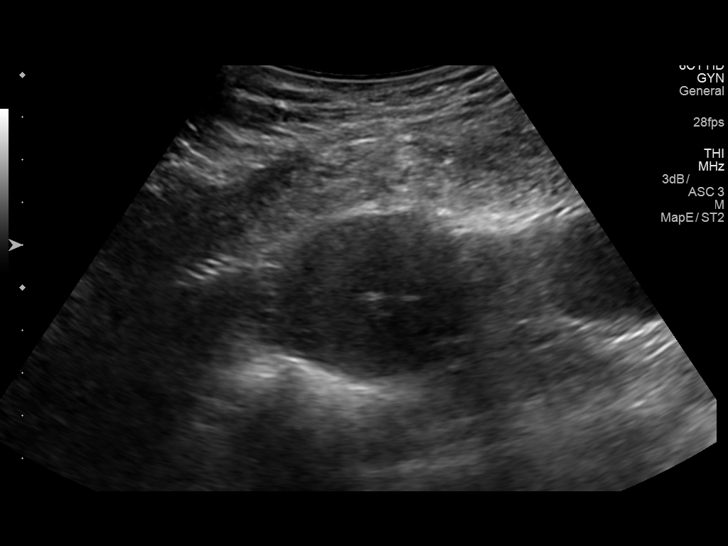
[im 14/81]
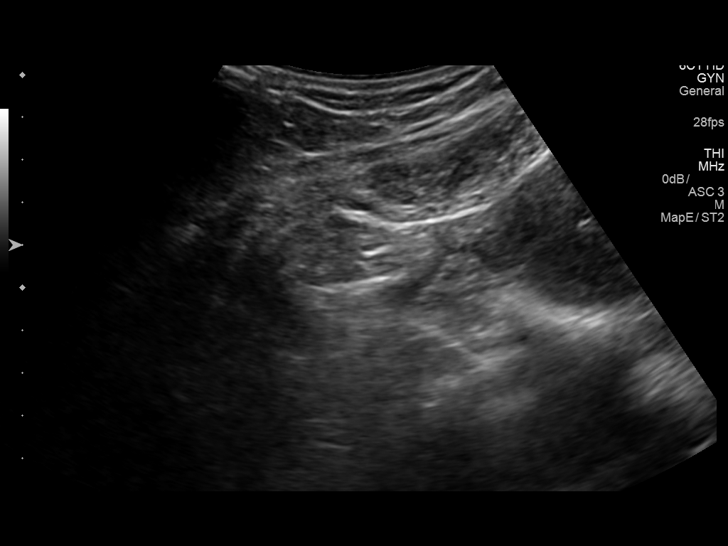
[im 21/81]
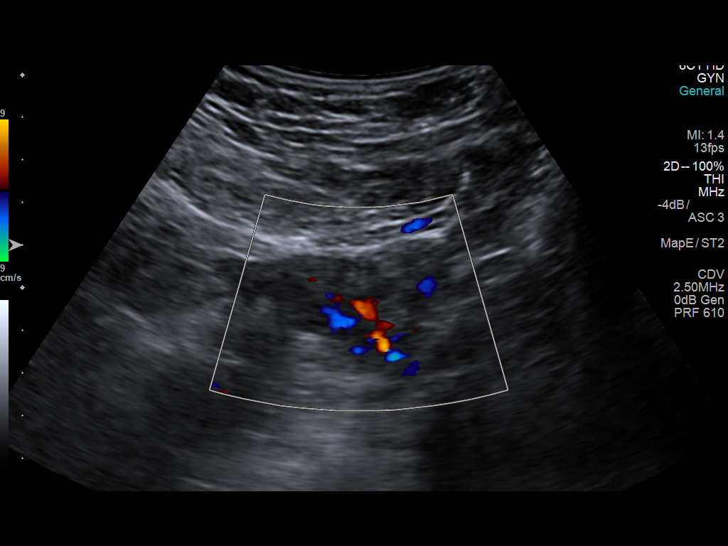
[im 27/81]
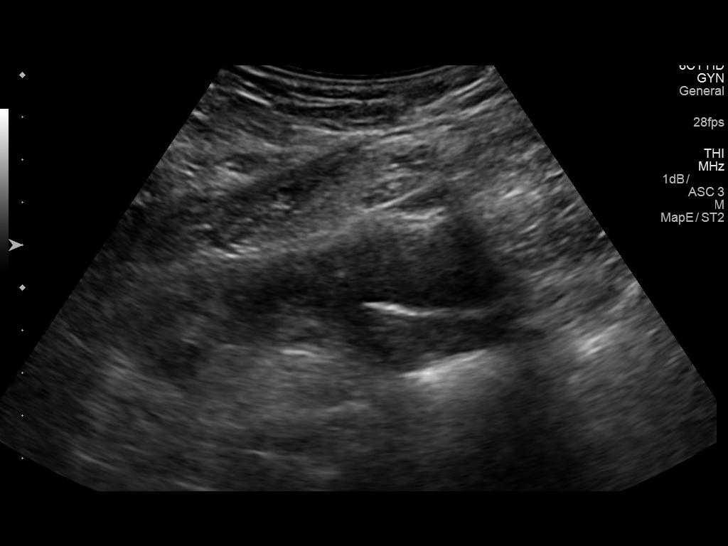
[im 34/81]
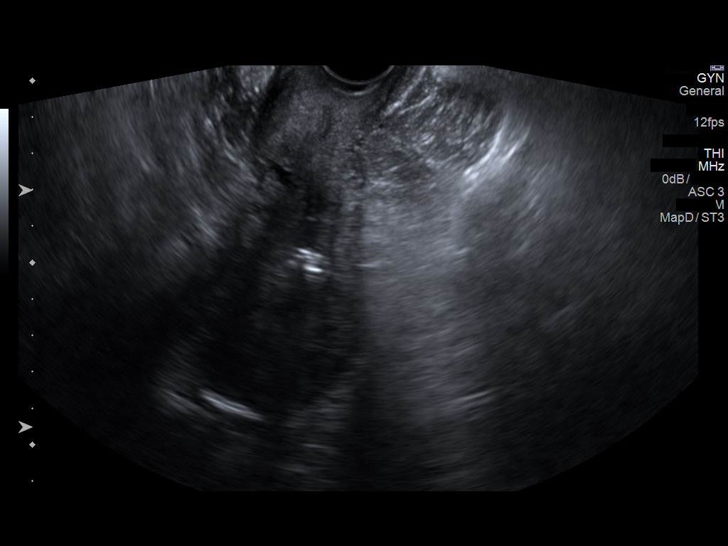
[im 41/81]
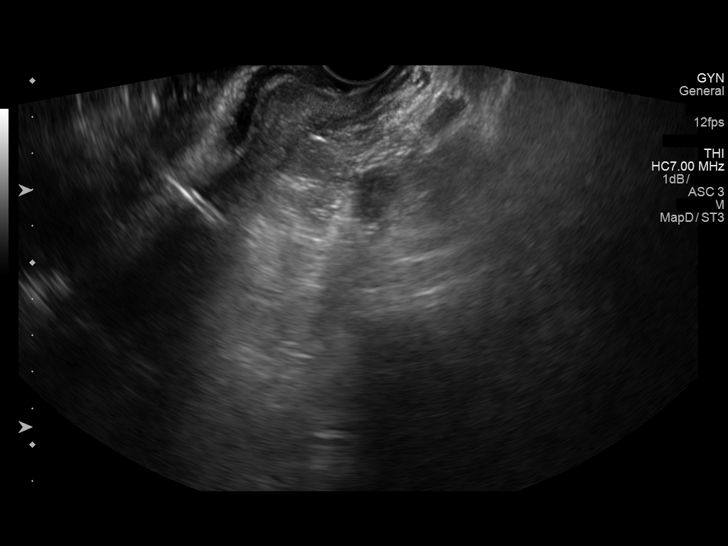
[im 47/81]
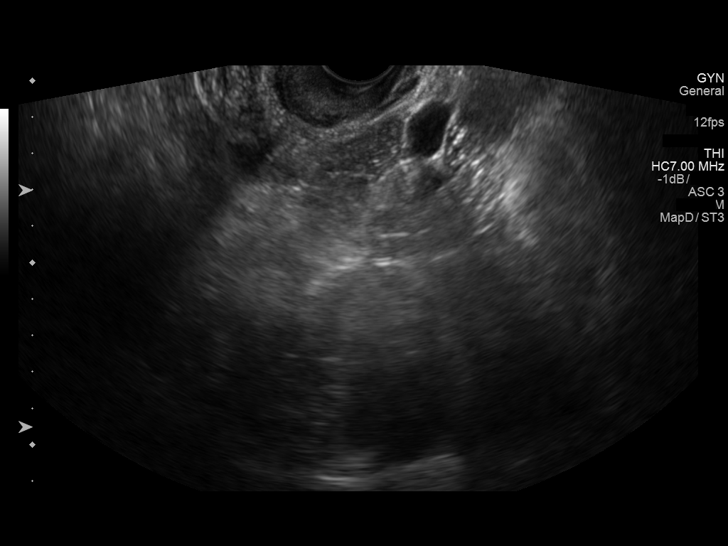
[im 54/81]
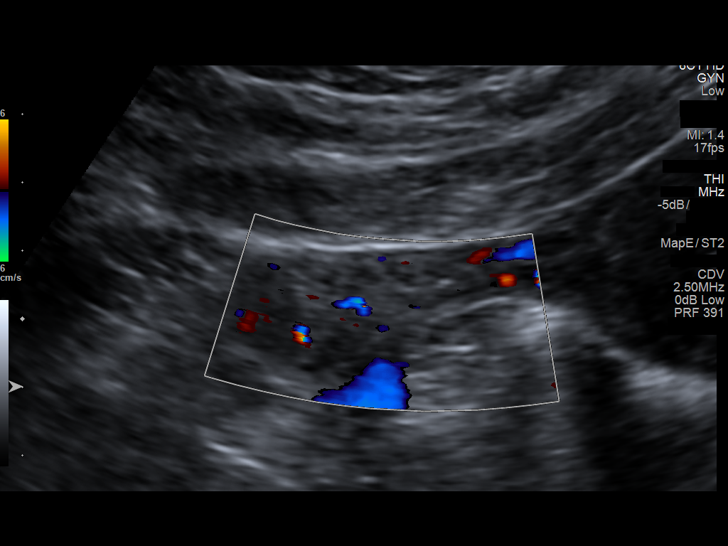
[im 61/81]
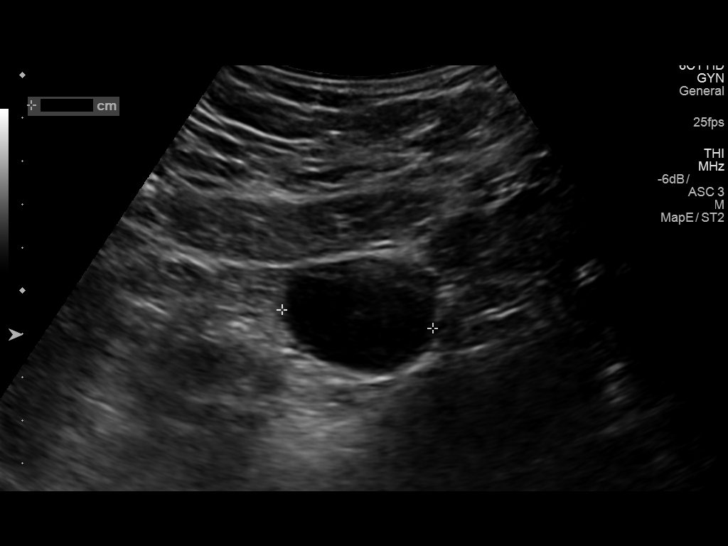
[im 67/81]
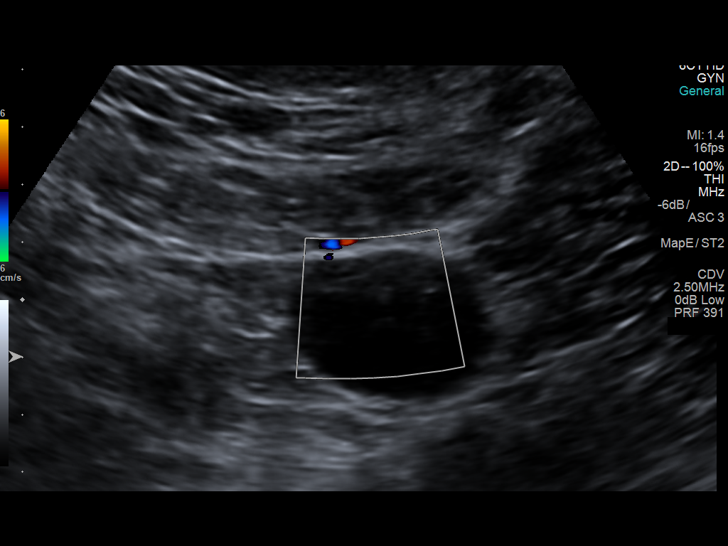
[im 74/81]
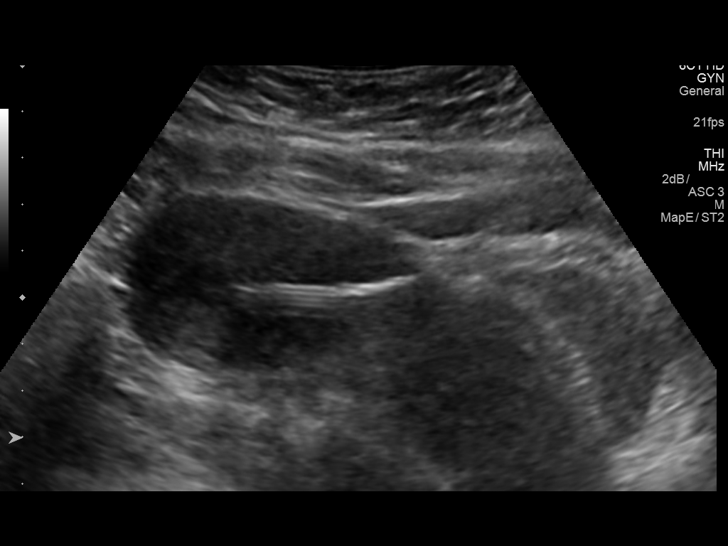
[im 81/81]
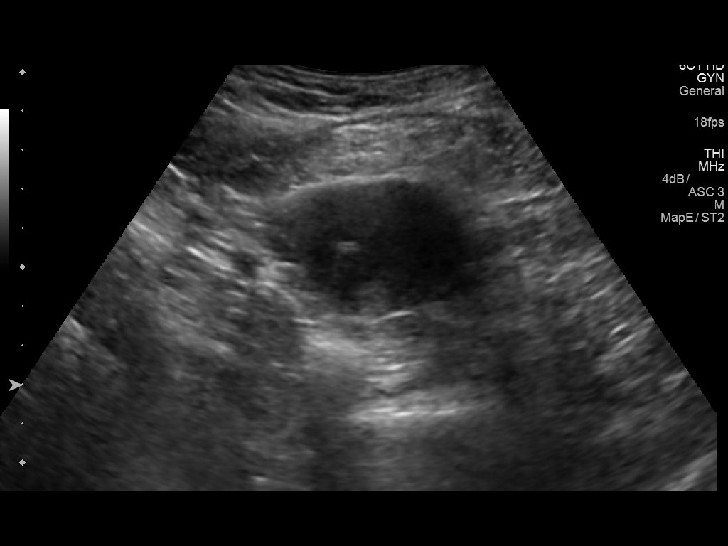

[13 of 25 positions shown; findings below may reference images not displayed]

FINDINGS: Uterus

Measurements: 11.3 x 4.0 x 4.9 cm. No fibroids or other mass
visualized.

Endometrium

Thickness: 5 mm. An intrauterine device is noted. The positioning of
the IUD is not well evaluated on this study. No focal abnormality
visualized.

Right ovary

Measurements: 3.6 x 2.3 x 3.1 cm. Normal appearance/no adnexal mass.

Left ovary

Measurements: 4.7 x 3.2 x 5.1 cm.. There is a 3.6 x 2.7 x 3.9 cm
cyst in the left ovary.

Pulsed Doppler evaluation of both ovaries demonstrates normal
low-resistance arterial and venous waveforms.

Other findings

No abnormal free fluid.
IMPRESSION: Left ovarian cyst otherwise unremarkable pelvic ultrasound.
Bilateral ovarian Doppler flow demonstrate.

IUD device with suboptimal evaluation for positioning.

## 2016-07-29 ENCOUNTER — Encounter: Payer: Self-pay | Admitting: Obstetrics

## 2016-08-04 ENCOUNTER — Encounter: Payer: Self-pay | Admitting: Family Medicine

## 2016-08-15 ENCOUNTER — Ambulatory Visit (INDEPENDENT_AMBULATORY_CARE_PROVIDER_SITE_OTHER): Payer: Medicaid Other | Admitting: Family Medicine

## 2016-08-15 ENCOUNTER — Encounter: Payer: Self-pay | Admitting: Family Medicine

## 2016-08-15 ENCOUNTER — Other Ambulatory Visit (HOSPITAL_COMMUNITY)
Admission: RE | Admit: 2016-08-15 | Discharge: 2016-08-15 | Disposition: A | Discharge status: Home / Self Care | Payer: Medicaid Other | Source: Ambulatory Visit | Attending: Family Medicine | Admitting: Family Medicine

## 2016-08-15 VITALS — BP 119/69 | HR 83 | Temp 98.3°F | Resp 16 | Ht 62.0 in | Wt 229.0 lb

## 2016-08-15 DIAGNOSIS — R350 Frequency of micturition: Secondary | ICD-10-CM

## 2016-08-15 DIAGNOSIS — Z202 Contact with and (suspected) exposure to infections with a predominantly sexual mode of transmission: Secondary | ICD-10-CM | POA: Diagnosis not present

## 2016-08-15 DIAGNOSIS — L298 Other pruritus: Secondary | ICD-10-CM | POA: Diagnosis not present

## 2016-08-15 DIAGNOSIS — N898 Other specified noninflammatory disorders of vagina: Secondary | ICD-10-CM

## 2016-08-15 DIAGNOSIS — N760 Acute vaginitis: Secondary | ICD-10-CM | POA: Insufficient documentation

## 2016-08-15 LAB — POCT URINALYSIS DIP (DEVICE)
Bilirubin Urine: NEGATIVE
Glucose, UA: NEGATIVE mg/dL
Hgb urine dipstick: NEGATIVE
Ketones, ur: NEGATIVE mg/dL
Leukocytes, UA: NEGATIVE
Nitrite: NEGATIVE
Protein, ur: NEGATIVE mg/dL
Specific Gravity, Urine: 1.01 (ref 1.005–1.030)
Urobilinogen, UA: 0.2 mg/dL (ref 0.0–1.0)
pH: 5.5 (ref 5.0–8.0)

## 2016-08-15 LAB — POCT URINE PREGNANCY: Preg Test, Ur: NEGATIVE

## 2016-08-15 NOTE — Progress Notes (Signed)
Angela DuffelChanel Henneman, a 31 year old female presents complaining of vaginal itching and irritation. She says that vaginal itching started on 08/07/2016 during her menstrual cycle. She denies abnormal odor, discharge, abdominal pain or dysuria. She endorses urinary frequency. She is sexually active and does not use barrier protection with sexual intercourse. Menstrual pattern is normal. [name] says that she recently had her IUD removed and is does not want oral contraception or depo provera.   Past Medical History:  Diagnosis Date  . Chronic back pain   . Depression   . Epilepsy (HCC)   . Headache   . Neuropathy   . Neuropathy   . Seizures (HCC)   . Stroke (HCC)    tia  . Vertigo    Social History   Social History  . Marital status: Single    Spouse name: N/A  . Number of children: 1  . Years of education: N/A   Occupational History  . Un employed    Social History Main Topics  . Smoking status: Current Every Day Smoker    Packs/day: 0.25    Types: Cigarettes  . Smokeless tobacco: Never Used     Comment: 4-5 per day   . Alcohol use 0.0 oz/week     Comment: occ  . Drug use: No  . Sexual activity: Yes    Birth control/ protection: IUD   Other Topics Concern  . Not on file   Social History Narrative  . No narrative on file   Review of Systems  Constitutional: Negative.   HENT: Negative.   Eyes: Negative.   Respiratory: Negative.   Cardiovascular: Negative.   Gastrointestinal: Negative.   Genitourinary: Positive for frequency.       Vaginal itching Vaginal discharge  Musculoskeletal: Negative.   Skin: Negative.   Neurological: Negative.   Endo/Heme/Allergies: Negative.   Psychiatric/Behavioral: Negative.   Physical Exam  Constitutional: She is oriented to person, place, and time and well-developed, well-nourished, and in no distress.  HENT:  Head: Normocephalic and atraumatic.  Right Ear: External ear normal.  Left Ear: External ear normal.  Nose: Nose normal.   Mouth/Throat: Oropharynx is clear and moist.  Eyes: Conjunctivae and EOM are normal. Pupils are equal, round, and reactive to light.  Neck: Normal range of motion. Neck supple.  Cardiovascular: Normal rate, normal heart sounds and intact distal pulses.   Pulmonary/Chest: Effort normal and breath sounds normal.  Abdominal: Soft. Bowel sounds are normal.  Neurological: She is alert and oriented to person, place, and time. Gait normal.  Skin: Skin is warm and dry.  Psychiatric: Mood, memory, affect and judgment normal.   Plan   1. Possible exposure to STD Use barrier protection with sexual intercourse - RPR - HIV antibody (with reflex) - Cervicovaginal ancillary only  2. Urinary frequency Reviewed urinalysis, unremarkable.  - POCT urine pregnancy  3. Vaginal itching - Cervicovaginal ancillary only  RTC: 6 months for chronic conditions.    Nolon NationsLaChina Moore Bubber Rothert  MSN, FNP-C [facility] [address]  [place], [place] [id number] [phone]

## 2016-08-15 NOTE — Patient Instructions (Addendum)
Recommend barrier protection with sexual intercourse.  Use hypoallergenic vaginal hygiene products Refrain from using wash clothes with color and refrain from douching  Recommend white cotton underwear with an unscented panty daily

## 2016-08-16 LAB — HIV ANTIBODY (ROUTINE TESTING W REFLEX): HIV 1&2 Ab, 4th Generation: NONREACTIVE

## 2016-08-16 LAB — RPR

## 2016-08-16 LAB — CERVICOVAGINAL ANCILLARY ONLY: WET PREP (BD AFFIRM): POSITIVE — AB

## 2016-08-17 ENCOUNTER — Other Ambulatory Visit: Payer: Self-pay | Admitting: Family Medicine

## 2016-08-17 ENCOUNTER — Ambulatory Visit (INDEPENDENT_AMBULATORY_CARE_PROVIDER_SITE_OTHER): Payer: Medicaid Other | Admitting: Neurology

## 2016-08-17 ENCOUNTER — Encounter: Payer: Self-pay | Admitting: Neurology

## 2016-08-17 VITALS — BP 112/64 | HR 76 | Ht 62.0 in | Wt 228.0 lb

## 2016-08-17 DIAGNOSIS — G43009 Migraine without aura, not intractable, without status migrainosus: Secondary | ICD-10-CM | POA: Diagnosis not present

## 2016-08-17 DIAGNOSIS — G894 Chronic pain syndrome: Secondary | ICD-10-CM | POA: Diagnosis not present

## 2016-08-17 DIAGNOSIS — R51 Headache: Secondary | ICD-10-CM | POA: Diagnosis not present

## 2016-08-17 DIAGNOSIS — F445 Conversion disorder with seizures or convulsions: Secondary | ICD-10-CM

## 2016-08-17 DIAGNOSIS — N760 Acute vaginitis: Principal | ICD-10-CM

## 2016-08-17 DIAGNOSIS — B9689 Other specified bacterial agents as the cause of diseases classified elsewhere: Secondary | ICD-10-CM

## 2016-08-17 DIAGNOSIS — R519 Headache, unspecified: Secondary | ICD-10-CM

## 2016-08-17 MED ORDER — METRONIDAZOLE 500 MG PO TABS: 500.0000 mg | ORAL_TABLET | Freq: Two times a day (BID) | ORAL | 0 refills | Status: DC

## 2016-08-17 NOTE — Patient Instructions (Addendum)
1. Glad to hear the stress seizures are under control, continue with Psychiatry and therapy 2. It is important to cut down on Tylenol and Motrin use to only 2-3 times a week, otherwise headaches will not improve 3. Continue all medications and follow-up with Pain Management 4. Follow-up in 1 year, call for any changes

## 2016-08-17 NOTE — Progress Notes (Signed)
Called and spoke with patient. Advised of positive for Bacterial Vaginitis and to take Metronidazole 500mg  twice daily for 7 days and to avoid alcohol while on the medication. Patient verbalized understanding and had not questions at this time. Thanks!
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
## 2016-08-17 NOTE — Progress Notes (Signed)
Meds ordered this encounter  Medications  . metroNIDAZOLE (FLAGYL) 500 MG tablet    Sig: Take 1 tablet (500 mg total) by mouth 2 (two) times daily.    Dispense:  14 tablet    Refill:  0    Angela Friesen Moore Alline Pio  MSN, FNP-C [facility] [address]  [place], [place] [address] [phone]  

## 2016-08-22 ENCOUNTER — Encounter: Payer: Self-pay | Admitting: Family Medicine

## 2016-08-22 ENCOUNTER — Encounter: Payer: Self-pay | Admitting: Obstetrics

## 2016-08-26 ENCOUNTER — Encounter: Payer: Self-pay | Admitting: Obstetrics

## 2016-08-29 ENCOUNTER — Other Ambulatory Visit: Payer: Self-pay | Admitting: *Deleted

## 2016-08-29 DIAGNOSIS — Z3169 Encounter for other general counseling and advice on procreation: Secondary | ICD-10-CM

## 2016-08-29 NOTE — Progress Notes (Signed)
Pt sent mychart message for PNV to be sent to pharmacy.  Pt states she has not yet picked up Rx. Pharmacy was verified, pt made aware call will be placed in order for pharmacy to fill.

## 2016-08-30 ENCOUNTER — Other Ambulatory Visit: Payer: Self-pay | Admitting: *Deleted

## 2016-08-30 DIAGNOSIS — Z3169 Encounter for other general counseling and advice on procreation: Secondary | ICD-10-CM

## 2016-08-30 MED ORDER — PRENATE PIXIE 10-0.6-0.4-200 MG PO CAPS: 1.0000 | ORAL_CAPSULE | Freq: Every day | ORAL | 11 refills | Status: DC

## 2016-08-30 NOTE — Progress Notes (Signed)
PNV reordered to pharmacy. Pharmacy does not have original order.

## 2016-10-06 ENCOUNTER — Encounter: Payer: Self-pay | Admitting: Obstetrics

## 2016-10-19 ENCOUNTER — Emergency Department (HOSPITAL_BASED_OUTPATIENT_CLINIC_OR_DEPARTMENT_OTHER)
Admission: EM | Admit: 2016-10-19 | Discharge: 2016-10-19 | Disposition: A | Discharge status: Home / Self Care | Payer: No Typology Code available for payment source | Attending: Emergency Medicine | Admitting: Emergency Medicine

## 2016-10-19 ENCOUNTER — Emergency Department (HOSPITAL_BASED_OUTPATIENT_CLINIC_OR_DEPARTMENT_OTHER): Payer: No Typology Code available for payment source

## 2016-10-19 ENCOUNTER — Encounter (HOSPITAL_BASED_OUTPATIENT_CLINIC_OR_DEPARTMENT_OTHER): Payer: Self-pay

## 2016-10-19 DIAGNOSIS — M545 Low back pain, unspecified: Secondary | ICD-10-CM

## 2016-10-19 DIAGNOSIS — M25511 Pain in right shoulder: Secondary | ICD-10-CM | POA: Diagnosis present

## 2016-10-19 DIAGNOSIS — F17210 Nicotine dependence, cigarettes, uncomplicated: Secondary | ICD-10-CM | POA: Insufficient documentation

## 2016-10-19 DIAGNOSIS — R51 Headache: Secondary | ICD-10-CM | POA: Diagnosis not present

## 2016-10-19 DIAGNOSIS — Z79899 Other long term (current) drug therapy: Secondary | ICD-10-CM | POA: Insufficient documentation

## 2016-10-19 DIAGNOSIS — R519 Headache, unspecified: Secondary | ICD-10-CM

## 2016-10-19 MED ORDER — IBUPROFEN 800 MG PO TABS: 800.0000 mg | ORAL_TABLET | Freq: Three times a day (TID) | ORAL | 0 refills | Status: DC

## 2016-10-19 MED ORDER — TIZANIDINE HCL 2 MG PO CAPS: 2.0000 mg | ORAL_CAPSULE | Freq: Three times a day (TID) | ORAL | 0 refills | Status: DC | PRN
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
## 2016-10-19 NOTE — Discharge Instructions (Signed)
Take ibuprofen as prescribed. Take other anti-inflammatories at the same time (Advil, Motrin, naproxen, Aleve). You may supplement with Tylenol as needed. You may take the Zanaflex as needed for muscle pain or stiffness. You will likely have continued muscle pain over the next several days. Follow up with your primary care doctor in 1 week if you had no improvement of her pain. Return to the emergency room if you develop vomiting, worsening headaches, difficulty concentrating, numbness, tingling, or any new or worsening symptoms.

## 2016-11-01 ENCOUNTER — Ambulatory Visit: Payer: Self-pay | Admitting: Obstetrics

## 2016-11-07 ENCOUNTER — Encounter: Payer: Self-pay | Admitting: Obstetrics

## 2016-11-07 ENCOUNTER — Ambulatory Visit (INDEPENDENT_AMBULATORY_CARE_PROVIDER_SITE_OTHER): Payer: Medicaid Other | Admitting: Obstetrics

## 2016-11-07 ENCOUNTER — Other Ambulatory Visit (HOSPITAL_COMMUNITY)
Admission: RE | Admit: 2016-11-07 | Discharge: 2016-11-07 | Disposition: A | Discharge status: Home / Self Care | Payer: Medicaid Other | Source: Ambulatory Visit | Attending: Obstetrics | Admitting: Obstetrics

## 2016-11-07 VITALS — BP 148/91 | HR 71 | Wt 230.8 lb

## 2016-11-07 DIAGNOSIS — N939 Abnormal uterine and vaginal bleeding, unspecified: Secondary | ICD-10-CM

## 2016-11-07 DIAGNOSIS — R109 Unspecified abdominal pain: Secondary | ICD-10-CM

## 2016-11-07 DIAGNOSIS — N760 Acute vaginitis: Secondary | ICD-10-CM | POA: Diagnosis not present

## 2016-11-07 DIAGNOSIS — N898 Other specified noninflammatory disorders of vagina: Secondary | ICD-10-CM

## 2016-11-07 DIAGNOSIS — B9689 Other specified bacterial agents as the cause of diseases classified elsewhere: Secondary | ICD-10-CM

## 2016-11-07 DIAGNOSIS — Z3202 Encounter for pregnancy test, result negative: Secondary | ICD-10-CM

## 2016-11-07 LAB — POCT URINALYSIS DIPSTICK
Bilirubin, UA: NEGATIVE
Blood, UA: NEGATIVE
GLUCOSE UA: NEGATIVE
Leukocytes, UA: NEGATIVE
Nitrite, UA: NEGATIVE
PROTEIN UA: NEGATIVE
SPEC GRAV UA: 1.01 (ref 1.010–1.025)
UROBILINOGEN UA: NEGATIVE U/dL — AB
pH, UA: 5 (ref 5.0–8.0)

## 2016-11-07 LAB — POCT URINE PREGNANCY: PREG TEST UR: NEGATIVE

## 2016-11-07 MED ORDER — METRONIDAZOLE 500 MG PO TABS: 500.0000 mg | ORAL_TABLET | Freq: Two times a day (BID) | ORAL | 2 refills | Status: DC

## 2016-11-07 NOTE — Progress Notes (Signed)
Patient ID: Angela Doyle, female   DOB: 02/17/1986, 31 y.o.   MRN: [id number]  Chief Complaint  Patient presents with  . Gynecologic Exam    HPI Angela Doyle is a 31 y.o. female.  Malodorous vaginal discharge. HPI  Past Medical History:  Diagnosis Date  . Chronic back pain   . Depression   . Epilepsy (HCC)   . Headache   . Neuropathy   . Neuropathy   . Seizures (HCC)   . Stroke (HCC)    tia  . Vertigo     Past Surgical History:  Procedure Laterality Date  . CESAREAN SECTION  2011  . OVARIAN CYST SURGERY      Family History  Problem Relation Age of Onset  . Neuropathy Mother   . Heart disease Maternal Grandmother   . Diabetes Maternal Grandmother   . Breast cancer Maternal Aunt     Social History Social History  Substance Use Topics  . Smoking status: Current Every Day Smoker    Packs/day: 0.25    Types: Cigarettes  . Smokeless tobacco: Never Used     Comment: 4-5 per day   . Alcohol use 0.0 oz/week     Comment: occ    Allergies  Allergen Reactions  . Pineapple Swelling    Oral swelling    Current Outpatient Prescriptions  Medication Sig Dispense Refill  . busPIRone (BUSPAR) 10 MG tablet Take 0.5 tablets (5 mg total) by mouth 2 (two) times daily. 60 tablet 5  . oxyCODONE (OXY IR/ROXICODONE) 5 MG immediate release tablet Take 5 mg by mouth 2 (two) times daily.    . pregabalin (LYRICA) 100 MG capsule Take 100 mg by mouth 2 (two) times daily.    . Prenat-FeAsp-Meth-FA-DHA w/o A (PRENATE PIXIE) 10-0.6-0.4-200 MG CAPS Take 1 capsule by mouth daily before breakfast. 30 capsule 11  . promethazine (PHENERGAN) 12.5 MG tablet Take 1 tablet (12.5 mg total) by mouth every 6 (six) hours as needed for nausea or vomiting. 30 tablet 0  . sertraline (ZOLOFT) 100 MG tablet Take 1 tablet (100 mg total) by mouth daily. 30 tablet 3  . tizanidine (ZANAFLEX) 2 MG capsule Take 1 capsule (2 mg total) by mouth 3 (three) times daily as needed for muscle spasms. 15 capsule 0  .  docusate sodium (COLACE) 100 MG capsule Take 1 capsule (100 mg total) by mouth daily. (Patient not taking: Reported on 11/07/2016) 30 capsule 0  . metroNIDAZOLE (FLAGYL) 500 MG tablet Take 1 tablet (500 mg total) by mouth 2 (two) times daily. 14 tablet 2   No current facility-administered medications for this visit.     Review of Systems Review of Systems Constitutional: negative for fatigue and weight loss Respiratory: negative for cough and wheezing Cardiovascular: negative for chest pain, fatigue and palpitations Gastrointestinal: negative for abdominal pain and change in bowel habits Genitourinary:negative Integument/breast: negative for nipple discharge Musculoskeletal:negative for myalgias Neurological: negative for gait problems and tremors Behavioral/Psych: negative for abusive relationship, depression Endocrine: negative for temperature intolerance      Blood pressure (!) 148/91, pulse 71, weight 230 lb 12.8 oz (104.7 kg), last menstrual period 11/02/2016.  Physical Exam Physical Exam           General:  Alert and no distress Abdomen:  normal findings: no organomegaly, soft, non-tender and no hernia  Pelvis:  External genitalia: normal general appearance Urinary system: urethral meatus normal and bladder without fullness, nontender Vaginal: normal without tenderness, induration or masses Cervix: normal appearance Adnexa:  normal bimanual exam Uterus: anteverted and non-tender, normal size    50% of 15 min visit spent on counseling and coordination of care.    Data Reviewed Wet Prep  Assessment     1. Abnormal uterine bleeding (AUB) Rx: - POCT urine pregnancy - Cervicovaginal ancillary only  2. Abdominal cramping Rx: - POCT urinalysis dipstick - Cervicovaginal ancillary only  3. Vaginal discharge Rx: - metroNIDAZOLE (FLAGYL) 500 MG tablet; Take 1 tablet (500 mg total) by mouth 2 (two) times daily.  Dispense: 14 tablet; Refill: 2     Plan    Follow up in  3 months for Annual  Orders Placed This Encounter  Procedures  . POCT urinalysis dipstick  . POCT urine pregnancy   Meds ordered this encounter  Medications  . metroNIDAZOLE (FLAGYL) 500 MG tablet    Sig: Take 1 tablet (500 mg total) by mouth 2 (two) times daily.    Dispense:  14 tablet    Refill:  2

## 2016-11-07 NOTE — Progress Notes (Signed)
Patient is in the office because she was experiencing some itching- it has subsided. She does have irregular cycle- and wants a pregnancy test.

## 2016-11-08 ENCOUNTER — Other Ambulatory Visit: Payer: Self-pay | Admitting: Obstetrics

## 2016-11-08 ENCOUNTER — Ambulatory Visit: Payer: Self-pay | Admitting: Neurology

## 2016-11-08 DIAGNOSIS — Z113 Encounter for screening for infections with a predominantly sexual mode of transmission: Secondary | ICD-10-CM

## 2016-11-08 LAB — CERVICOVAGINAL ANCILLARY ONLY
Bacterial vaginitis: POSITIVE — AB
CHLAMYDIA, DNA PROBE: NEGATIVE
Candida vaginitis: NEGATIVE
Neisseria Gonorrhea: NEGATIVE
Trichomonas: NEGATIVE

## 2016-11-09 ENCOUNTER — Other Ambulatory Visit: Payer: Self-pay | Admitting: Obstetrics

## 2016-11-15 ENCOUNTER — Encounter: Payer: Self-pay | Admitting: Neurology

## 2016-11-29 ENCOUNTER — Encounter: Payer: Self-pay | Admitting: Family Medicine

## 2016-12-12 ENCOUNTER — Ambulatory Visit: Payer: Medicaid Other | Admitting: Obstetrics

## 2016-12-12 ENCOUNTER — Ambulatory Visit (INDEPENDENT_AMBULATORY_CARE_PROVIDER_SITE_OTHER): Payer: Medicaid Other | Admitting: Obstetrics

## 2016-12-12 ENCOUNTER — Other Ambulatory Visit (HOSPITAL_COMMUNITY)
Admission: RE | Admit: 2016-12-12 | Discharge: 2016-12-12 | Disposition: A | Discharge status: Home / Self Care | Payer: Medicaid Other | Source: Ambulatory Visit | Attending: Obstetrics | Admitting: Obstetrics

## 2016-12-12 ENCOUNTER — Encounter: Payer: Self-pay | Admitting: Obstetrics

## 2016-12-12 VITALS — BP 117/77 | HR 74 | Ht 62.0 in | Wt 235.2 lb

## 2016-12-12 DIAGNOSIS — Z01419 Encounter for gynecological examination (general) (routine) without abnormal findings: Secondary | ICD-10-CM | POA: Diagnosis not present

## 2016-12-12 DIAGNOSIS — R102 Pelvic and perineal pain: Secondary | ICD-10-CM

## 2016-12-12 DIAGNOSIS — N898 Other specified noninflammatory disorders of vagina: Secondary | ICD-10-CM

## 2016-12-12 DIAGNOSIS — Z0000 Encounter for general adult medical examination without abnormal findings: Secondary | ICD-10-CM

## 2016-12-12 NOTE — Progress Notes (Signed)
Last pap 07/2015 Normal

## 2016-12-12 NOTE — Progress Notes (Signed)
Subjective:        Angela Doyle is a 31 y.o. female here for a routine exam.  Current complaints: Right sided pelvic pain..    Personal health questionnaire:  Is patient [name], have a family history of breast and/or ovarian cancer: no Is there a family history of uterine cancer diagnosed at age < 84, gastrointestinal cancer, urinary tract cancer, family member who is a Personnel officer syndrome-associated carrier: no Is the patient overweight and hypertensive, family history of diabetes, personal history of gestational diabetes, preeclampsia or PCOS: no Is patient over 18, have PCOS,  family history of premature CHD under age 29, diabetes, smoke, have hypertension or peripheral artery disease:  no At any time, has a partner hit, kicked or otherwise hurt or frightened you?: no Over the past 2 weeks, have you felt down, depressed or hopeless?: no Over the past 2 weeks, have you felt little interest or pleasure in doing things?:no   Gynecologic History Patient's last menstrual period was 12/01/2016. Contraception: none Last Pap: 2017. Results were: normal Last mammogram: n/a. Results were: n/a  Obstetric History OB History  Gravida Para Term Preterm AB Living  SAB TAB Ectopic Multiple Live Births  1            # Outcome Date GA Lbr Len/2nd Weight Sex Delivery Anes PTL Lv  2 SAB           1 Term               Past Medical History:  Diagnosis Date  . Chronic back pain   . Depression   . Epilepsy (HCC)   . Headache   . Neuropathy   . Neuropathy   . Seizures (HCC)   . Stroke (HCC)    tia  . Vertigo     Past Surgical History:  Procedure Laterality Date  . CESAREAN SECTION  2011  . OVARIAN CYST SURGERY       Current Outpatient Prescriptions:  .  busPIRone (BUSPAR) 10 MG tablet, Take 0.5 tablets (5 mg total) by mouth 2 (two) times daily., Disp: 60 tablet, Rfl: 5 .  docusate sodium (COLACE) 100 MG capsule, Take 1 capsule (100 mg total) by mouth daily.,  Disp: 30 capsule, Rfl: 0 .  oxyCODONE (OXY IR/ROXICODONE) 5 MG immediate release tablet, Take 5 mg by mouth 2 (two) times daily., Disp: , Rfl:  .  pregabalin (LYRICA) 100 MG capsule, Take 100 mg by mouth 2 (two) times daily., Disp: , Rfl:  .  Prenat-FeAsp-Meth-FA-DHA w/o A (PRENATE PIXIE) 10-0.6-0.4-200 MG CAPS, Take 1 capsule by mouth daily before breakfast., Disp: 30 capsule, Rfl: 11 .  promethazine (PHENERGAN) 12.5 MG tablet, Take 1 tablet (12.5 mg total) by mouth every 6 (six) hours as needed for nausea or vomiting., Disp: 30 tablet, Rfl: 0 .  sertraline (ZOLOFT) 100 MG tablet, Take 1 tablet (100 mg total) by mouth daily., Disp: 30 tablet, Rfl: 3 .  tizanidine (ZANAFLEX) 2 MG capsule, Take 1 capsule (2 mg total) by mouth 3 (three) times daily as needed for muscle spasms., Disp: 15 capsule, Rfl: 0 Allergies  Allergen Reactions  . Pineapple Swelling    Oral swelling    Social History  Substance Use Topics  . Smoking status: Current Every Day Smoker    Packs/day: 0.25    Types: Cigarettes  . Smokeless tobacco: Never Used     Comment: 4-5 per day   . Alcohol  use 0.0 oz/week     Comment: occ    Family History  Problem Relation Age of Onset  . Neuropathy Mother   . Heart disease Maternal Grandmother   . Diabetes Maternal Grandmother   . Breast cancer Maternal Aunt       Review of Systems  Constitutional: negative for fatigue and weight loss Respiratory: negative for cough and wheezing Cardiovascular: negative for chest pain, fatigue and palpitations Gastrointestinal: negative for abdominal pain and change in bowel habits Musculoskeletal:negative for myalgias Neurological: negative for gait problems and tremors Behavioral/Psych: negative for abusive relationship, depression Endocrine: negative for temperature intolerance    Genitourinary:negative for abnormal menstrual periods, genital lesions, hot flashes, sexual problems and vaginal discharge Integument/breast: negative for  breast lump, breast tenderness, nipple discharge and skin lesion(s)    Objective:       BP 117/77   Pulse 74   Ht  (1.575 m)   Wt 235 lb 3.2 oz (106.7 kg)   LMP 12/01/2016   BMI 43.02 kg/m  General:   alert  Skin:   no rash or abnormalities  Lungs:   clear to auscultation bilaterally  Heart:   regular rate and rhythm, S1, S2 normal, no murmur, click, rub or gallop  Breasts:   normal without suspicious masses, skin or nipple changes or axillary nodes  Abdomen:  normal findings: no organomegaly, soft, non-tender and no hernia  Pelvis:  External genitalia: normal general appearance Urinary system: urethral meatus normal and bladder without fullness, nontender Vaginal: normal without tenderness, induration or masses Cervix: normal appearance Adnexa: normal bimanual exam Uterus: anteverted and non-tender, normal size   Lab Review Urine pregnancy test Labs reviewed yes Radiologic studies reviewed yes  50% of 20 min visit spent on counseling and coordination of care.    Assessment:     1. Encounter for gynecological examination with Papanicolaou smear of cervix Rx: - Cytology - PAP  2. Vaginal discharge Rx: - Cervicovaginal ancillary only  3. Pelvic pain in female Rx: - US PELVIC COMPLETE WITH TRANSVAGINAL; Future - will call with ultrasound results   Plan:    Education reviewed: calcium supplements, depression evaluation, low fat, low cholesterol diet, safe sex/STD prevention, self breast exams, smoking cessation and weight bearing exercise. Contraception: none. Follow up in: 1 year.   No orders of the defined types were placed in this encounter.  Orders Placed This Encounter  Procedures  . US PELVIC COMPLETE WITH TRANSVAGINAL    Standing Status:   Future    Standing Expiration Date:   02/11/2018    Order Specific Question:   Reason for Exam (SYMPTOM  OR DIAGNOSIS REQUIRED)    Answer:   Pelvic pain    Order Specific Question:   Preferred imaging location?     Answer:   [facility]

## 2016-12-13 LAB — CYTOLOGY - PAP: Diagnosis: NEGATIVE

## 2016-12-13 LAB — CERVICOVAGINAL ANCILLARY ONLY
BACTERIAL VAGINITIS: POSITIVE — AB
CANDIDA VAGINITIS: NEGATIVE

## 2016-12-14 ENCOUNTER — Other Ambulatory Visit: Payer: Self-pay | Admitting: Obstetrics

## 2016-12-14 DIAGNOSIS — B9689 Other specified bacterial agents as the cause of diseases classified elsewhere: Secondary | ICD-10-CM

## 2016-12-14 DIAGNOSIS — N760 Acute vaginitis: Principal | ICD-10-CM

## 2016-12-14 MED ORDER — SECNIDAZOLE 2 G PO PACK: 1.0000 | PACK | Freq: Once | ORAL | 2 refills | Status: AC

## 2016-12-15 ENCOUNTER — Telehealth: Payer: Self-pay

## 2016-12-15 ENCOUNTER — Other Ambulatory Visit: Payer: Self-pay

## 2016-12-15 NOTE — Telephone Encounter (Signed)
-----   Message from Brock Badharles A Harper, MD sent at 12/14/2016  2:12 PM EDT ----- Solosec Rx for BV

## 2016-12-15 NOTE — Telephone Encounter (Signed)
Left VM message to call office.

## 2016-12-16 IMAGING — DX DG CHEST 2V
2 series · 2 of 2 positions shown · non-contrast
Comparison: None.

CLINICAL DATA: Left chest pain, shortness of breath

EXAM:
CHEST  2 VIEW

[chest pa]
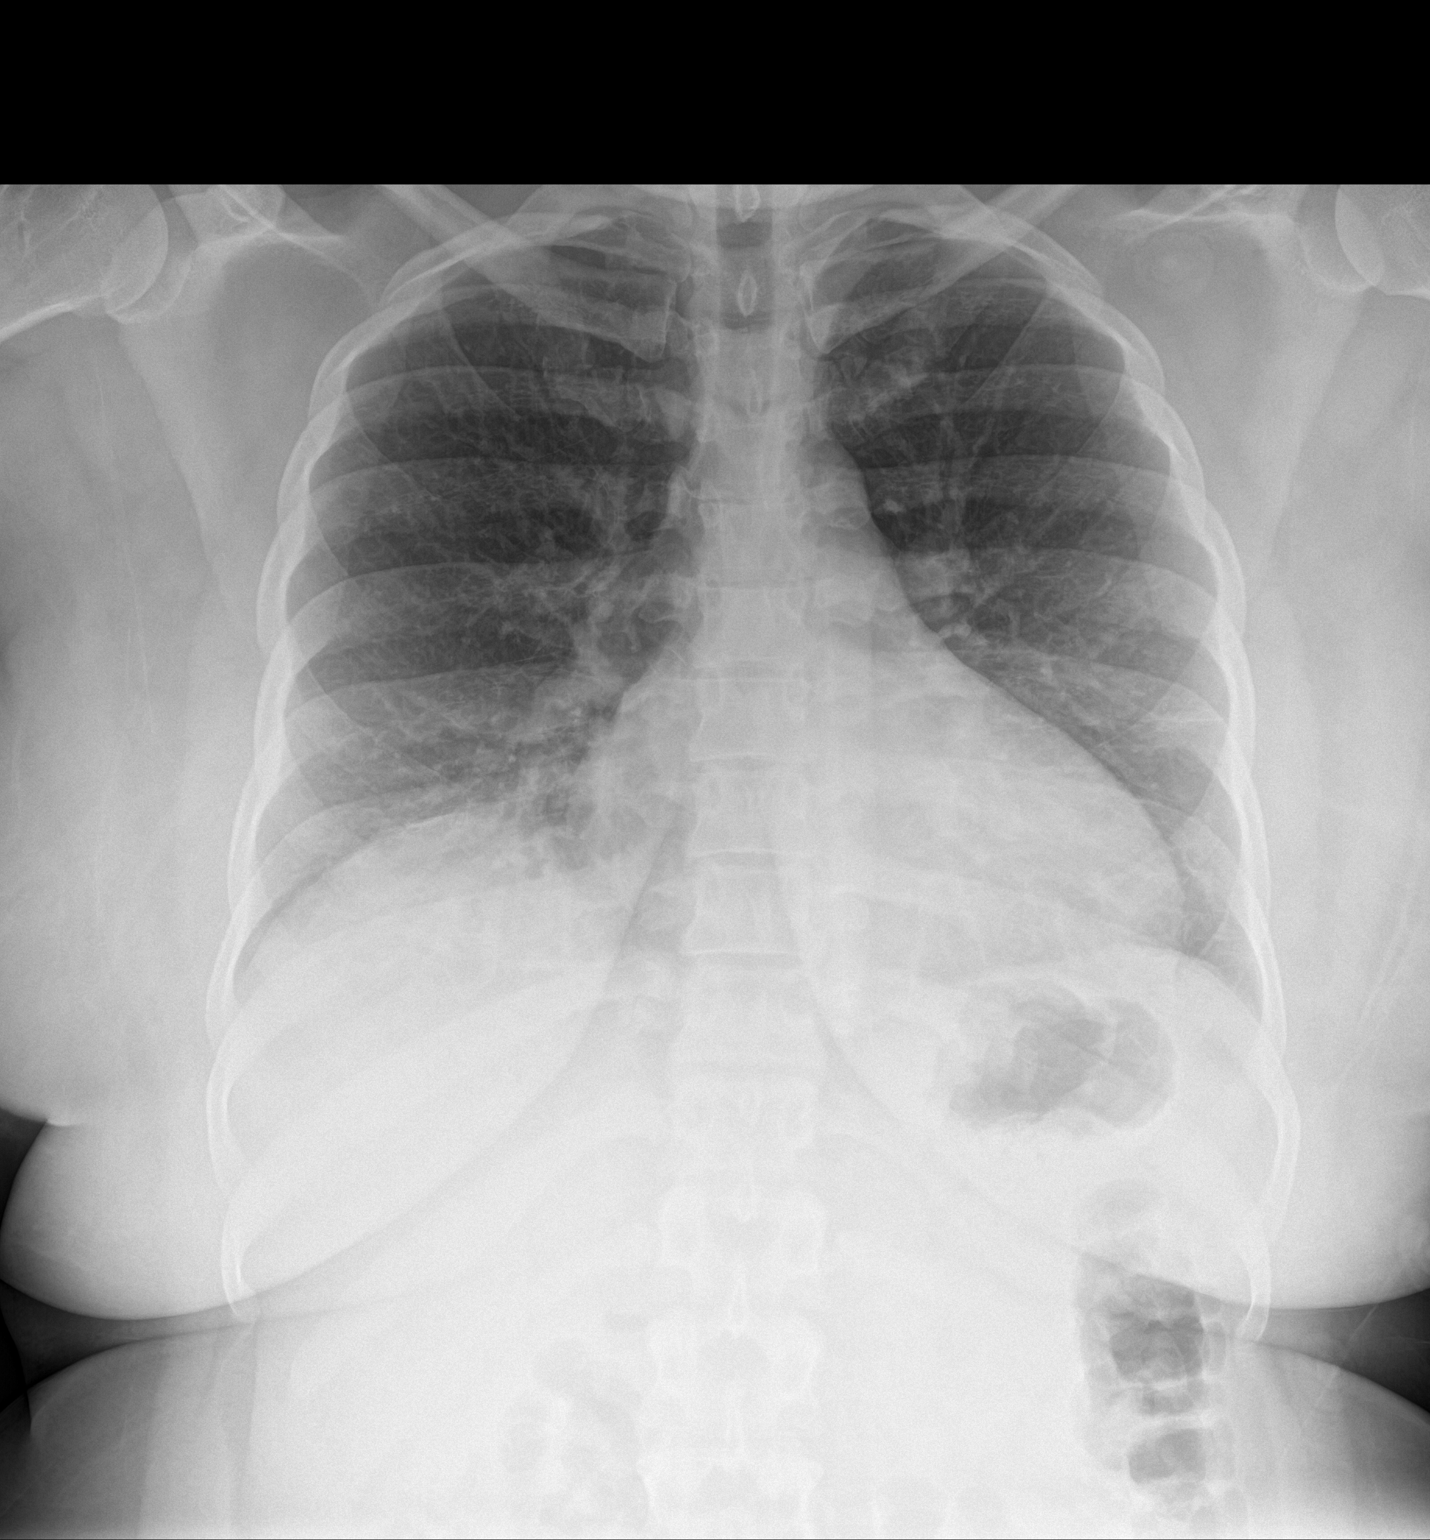

[chest lat]
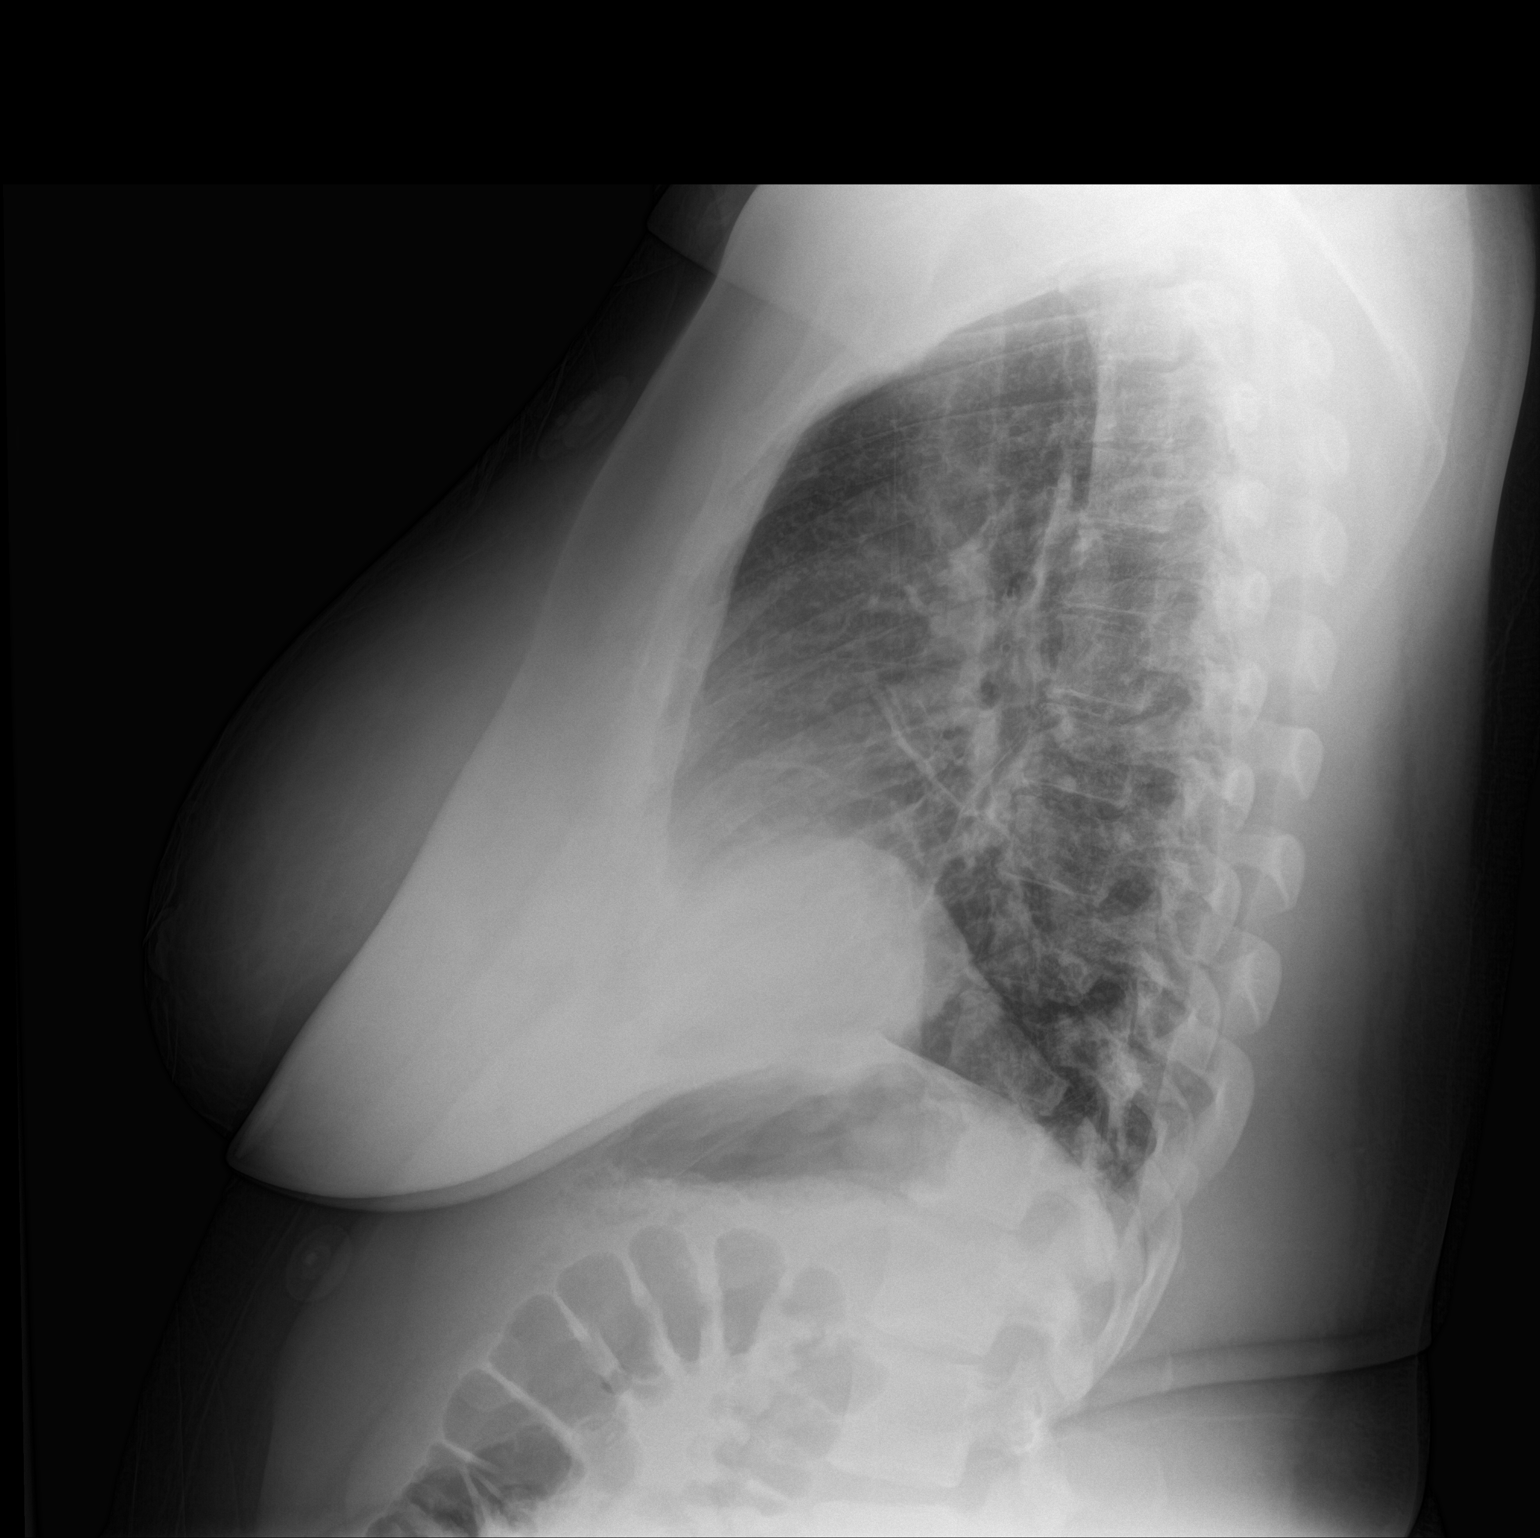

[2 of 2 positions shown; findings below may reference images not displayed]

FINDINGS: Lungs are essentially clear. Right hemidiaphragm is mildly obscured
on the frontal radiograph but appears clear on the lateral view. No
pleural effusion or pneumothorax.

The heart is top-normal in size.

Visualized osseous structures are within normal limits.
IMPRESSION: No evidence of acute cardiopulmonary disease.

## 2016-12-19 ENCOUNTER — Ambulatory Visit (HOSPITAL_COMMUNITY)
Admission: RE | Admit: 2016-12-19 | Discharge: 2016-12-19 | Disposition: A | Discharge status: Home / Self Care | Payer: Medicaid Other | Source: Ambulatory Visit | Attending: Obstetrics | Admitting: Obstetrics

## 2016-12-19 DIAGNOSIS — R102 Pelvic and perineal pain: Secondary | ICD-10-CM | POA: Insufficient documentation

## 2016-12-26 ENCOUNTER — Ambulatory Visit (INDEPENDENT_AMBULATORY_CARE_PROVIDER_SITE_OTHER): Payer: Medicaid Other | Admitting: Obstetrics

## 2016-12-26 ENCOUNTER — Encounter: Payer: Self-pay | Admitting: Obstetrics

## 2016-12-26 VITALS — BP 113/72 | HR 80 | Ht 62.0 in | Wt 234.2 lb

## 2016-12-26 DIAGNOSIS — R102 Pelvic and perineal pain: Secondary | ICD-10-CM | POA: Diagnosis not present

## 2016-12-26 NOTE — Progress Notes (Signed)
Patient ID: Angela Doyle, female   DOB: 04-24-1985, 31 y.o.   MRN: [id number]  Chief Complaint  Patient presents with  . Follow up    HPI Angela Doyle is a 31 y.o. female.  History of pelvic pain, resolved.  Presents for ultrasound results and follow up. HPI  Past Medical History:  Diagnosis Date  . Chronic back pain   . Depression   . Epilepsy (HCC)   . Headache   . Neuropathy   . Neuropathy   . Seizures (HCC)   . Stroke (HCC)    tia  . Vertigo     Past Surgical History:  Procedure Laterality Date  . CESAREAN SECTION  2011  . OVARIAN CYST SURGERY      Family History  Problem Relation Age of Onset  . Neuropathy Mother   . Heart disease Maternal Grandmother   . Diabetes Maternal Grandmother   . Breast cancer Maternal Aunt     Social History Social History  Substance Use Topics  . Smoking status: Current Every Day Smoker    Packs/day: 0.25    Types: Cigarettes  . Smokeless tobacco: Never Used     Comment: 4-5 per day   . Alcohol use 0.0 oz/week     Comment: occ    Allergies  Allergen Reactions  . Pineapple Swelling    Oral swelling    Current Outpatient Prescriptions  Medication Sig Dispense Refill  . tizanidine (ZANAFLEX) 2 MG capsule Take 1 capsule (2 mg total) by mouth 3 (three) times daily as needed for muscle spasms. 15 capsule 0  . busPIRone (BUSPAR) 10 MG tablet Take 0.5 tablets (5 mg total) by mouth 2 (two) times daily. (Patient not taking: Reported on 12/26/2016) 60 tablet 5  . docusate sodium (COLACE) 100 MG capsule Take 1 capsule (100 mg total) by mouth daily. (Patient not taking: Reported on 12/26/2016) 30 capsule 0  . oxyCODONE (OXY IR/ROXICODONE) 5 MG immediate release tablet Take 5 mg by mouth 2 (two) times daily.    . pregabalin (LYRICA) 100 MG capsule Take 100 mg by mouth 2 (two) times daily.    . Prenat-FeAsp-Meth-FA-DHA w/o A (PRENATE PIXIE) 10-0.6-0.4-200 MG CAPS Take 1 capsule by mouth daily before breakfast. (Patient not taking:  Reported on 12/26/2016) 30 capsule 11  . promethazine (PHENERGAN) 12.5 MG tablet Take 1 tablet (12.5 mg total) by mouth every 6 (six) hours as needed for nausea or vomiting. (Patient not taking: Reported on 12/26/2016) 30 tablet 0  . sertraline (ZOLOFT) 100 MG tablet Take 1 tablet (100 mg total) by mouth daily. (Patient not taking: Reported on 12/26/2016) 30 tablet 3   No current facility-administered medications for this visit.     Review of Systems Review of Systems Constitutional: negative for fatigue and weight loss Respiratory: negative for cough and wheezing Cardiovascular: negative for chest pain, fatigue and palpitations Gastrointestinal: negative for abdominal pain and change in bowel habits Genitourinary:negative Integument/breast: negative for nipple discharge Musculoskeletal:negative for myalgias Neurological: negative for gait problems and tremors Behavioral/Psych: negative for abusive relationship, depression Endocrine: negative for temperature intolerance      Blood pressure 113/72, pulse 80, height 5\' 2"  (1.575 m), weight 234 lb 3.2 oz (106.2 kg), last menstrual period 12/01/2016.  Physical Exam Physical Exam:            General:  Alert and no distress Abdomen:  normal findings: no organomegaly, soft, non-tender and no hernia  Pelvis:  External genitalia: normal general appearance Urinary system: urethral meatus  normal and bladder without fullness, nontender Vaginal: normal without tenderness, induration or masses Cervix: normal appearance Adnexa: normal bimanual exam Uterus: anteverted and non-tender, normal size    50% of 15 min visit spent on counseling and coordination of care.    Data Reviewed Ultrasound:  WNL's.  No acute abnormalities.  Assessment     1. Pelvic pain in female - resolved    Plan    Follow up prn  No orders of the defined types were placed in this encounter.  No orders of the defined types were placed in this  encounter.

## 2016-12-26 NOTE — Progress Notes (Signed)
Patient is in the office to follow up with u/s results.

## 2017-01-17 ENCOUNTER — Encounter: Payer: Self-pay | Admitting: Family Medicine

## 2017-01-17 ENCOUNTER — Ambulatory Visit: Payer: Medicaid Other | Admitting: Family Medicine

## 2017-01-17 VITALS — BP 117/68 | HR 68 | Temp 98.3°F | Resp 16 | Ht 62.0 in | Wt 237.0 lb

## 2017-01-17 DIAGNOSIS — Z23 Encounter for immunization: Secondary | ICD-10-CM

## 2017-01-17 DIAGNOSIS — G629 Polyneuropathy, unspecified: Secondary | ICD-10-CM | POA: Diagnosis not present

## 2017-01-17 DIAGNOSIS — F329 Major depressive disorder, single episode, unspecified: Secondary | ICD-10-CM

## 2017-01-17 DIAGNOSIS — G40909 Epilepsy, unspecified, not intractable, without status epilepticus: Secondary | ICD-10-CM | POA: Diagnosis not present

## 2017-01-17 DIAGNOSIS — F32A Depression, unspecified: Secondary | ICD-10-CM

## 2017-01-17 MED ORDER — DULOXETINE HCL 20 MG PO CPEP: 20.0000 mg | ORAL_CAPSULE | Freq: Every day | ORAL | 3 refills | Status: DC

## 2017-01-17 NOTE — Patient Instructions (Signed)
We will start a trial of Cymbalta 20 mg daily.  Will discontinue BuSpar and Zoloft as previously prescribed.  We will follow-up in 1 month to discuss neuropathy.  Reschedule appointment with neurology to discuss worsening neuropathy.  Continue to follow-up with pain management as scheduled   Reviewed previous labs, no acute abnormalities. Peripheral Neuropathy Peripheral neuropathy is a type of nerve damage. It affects nerves that carry signals between the spinal cord and other parts of the body. These are called peripheral nerves. With peripheral neuropathy, one nerve or a group of nerves may be damaged. What are the causes? Many things can damage peripheral nerves. For some people with peripheral neuropathy, the cause is unknown. Some causes include:  Diabetes. This is the most common cause of peripheral neuropathy.  Injury to a nerve.  Pressure or stress on a nerve that lasts a long time.  Too little vitamin B. Alcoholism can lead to this.  Infections.  Autoimmune diseases, such as multiple sclerosis and systemic lupus erythematosus.  Inherited nerve diseases.  Some medicines, such as cancer drugs.  Toxic substances, such as lead and mercury.  Too little blood flowing to the legs.  Kidney disease.  Thyroid disease.  What are the signs or symptoms? Different people have different symptoms. The symptoms you have will depend on which of your nerves is damaged. Common symptoms include:  Loss of feeling (numbness) in the feet and hands.  Tingling in the feet and hands.  Pain that burns.  Very sensitive skin.  Weakness.  Not being able to move a part of the body (paralysis).  Muscle twitching.  Clumsiness or poor coordination.  Loss of balance.  Not being able to control your bladder.  Feeling dizzy.  Sexual problems.  How is this diagnosed? Peripheral neuropathy is a symptom, not a disease. Finding the cause of peripheral neuropathy can be hard. To figure  that out, your health care provider will take a medical history and do a physical exam. A neurological exam will also be done. This involves checking things affected by your brain, spinal cord, and nerves (nervous system). For example, your health care provider will check your reflexes, how you move, and what you can feel. Other types of tests may also be ordered, such as:  Blood tests.  A test of the fluid in your spinal cord.  Imaging tests, such as CT scans or an MRI.  Electromyography (EMG). This test checks the nerves that control muscles.  Nerve conduction velocity tests. These tests check how fast messages pass through your nerves.  Nerve biopsy. A small piece of nerve is removed. It is then checked under a microscope.  How is this treated?  Medicine is often used to treat peripheral neuropathy. Medicines may include: ? Pain-relieving medicines. Prescription or over-the-counter medicine may be suggested. ? Antiseizure medicine. This may be used for pain. ? Antidepressants. These also may help ease pain from neuropathy. ? Lidocaine. This is a numbing medicine. You might wear a patch or be given a shot. ? Mexiletine. This medicine is typically used to help control irregular heart rhythms.  Surgery. Surgery may be needed to relieve pressure on a nerve or to destroy a nerve that is causing pain.  Physical therapy to help movement.  Assistive devices to help movement. Follow these instructions at home:  Only take over-the-counter or prescription medicines as directed by your health care provider. Follow the instructions carefully for any given medicines. Do not take any other medicines without first getting  approval from your health care provider.  If you have diabetes, work closely with your health care provider to keep your blood sugar under control.  If you have numbness in your feet: ? Check every day for signs of injury or infection. Watch for redness, warmth, and  swelling. ? Wear padded socks and comfortable shoes. These help protect your feet.  Do not do things that put pressure on your damaged nerve.  Do not smoke. Smoking keeps blood from getting to damaged nerves.  Avoid or limit alcohol. Too much alcohol can cause a lack of B vitamins. These vitamins are needed for healthy nerves.  Develop a good support system. Coping with peripheral neuropathy can be stressful. Talk to a mental health specialist or join a support group if you are struggling.  Follow up with your health care provider as directed. Contact a health care provider if:  You have new signs or symptoms of peripheral neuropathy.  You are struggling emotionally from dealing with peripheral neuropathy.  You have a fever. Get help right away if:  You have an injury or infection that is not healing.  You feel very dizzy or begin vomiting.  You have chest pain.  You have trouble breathing. This information is not intended to replace advice given to you by your health care provider. Make sure you discuss any questions you have with your health care provider. Document Released: 02/04/2002 Document Revised: 07/23/2015 Document Reviewed: 10/22/2012 Elsevier Interactive Patient Education  2017 [employer]

## 2017-01-17 NOTE — Progress Notes (Signed)
Angela Doyle, a 31 year old female with a history of seizure disorder, chronic pain, neuropathy and depression presents complaining of worsening neuropathy. Patient is under the care of neurology. She was last evaluated in June 2018. She says that she has been taking Lyrica 100 mg twice daily. She says that she has been taking medication consistently.  She describes symptoms of neuropathy as generalized "burning and tingling".  Onset of symptoms was gradual, starting about several months ago. Symptoms are currently of moderate severity.  Patient also complains of depression. She complains of depressed mood and difficulty concentrating.  She denies current suicidal and homicidal plan or intent.   Patient was previously on Zoloft and BuSpar, she has not been taking medications consistently.  Also she has been lost to follow-up with psychiatry. Past Medical History:  Diagnosis Date  . Chronic back pain   . Depression   . Epilepsy (HCC)   . Headache   . Neuropathy   . Neuropathy   . Seizures (HCC)   . Stroke (HCC)    tia  . Vertigo    Immunization History  Administered Date(s) Administered  . Influenza,inj,Quad PF,6+ Mos 11/25/2015  . Pneumococcal Polysaccharide-23 05/28/2015  . Tdap 11/25/2015   Social History   Socioeconomic History  . Marital status: Single    Spouse name: Not on file  . Number of children: 1  . Years of education: Not on file  . Highest education level: Not on file  Social Needs  . Financial resource strain: Not on file  . Food insecurity - worry: Not on file  . Food insecurity - inability: Not on file  . Transportation needs - medical: Not on file  . Transportation needs - non-medical: Not on file  Occupational History  . Occupation: Un employed  Tobacco Use  . Smoking status: Current Every Day Smoker    Packs/day: 0.25    Types: Cigarettes  . Smokeless tobacco: Never Used  . Tobacco comment: 4-5 per day   Substance and Sexual Activity  . Alcohol use:  Yes    Alcohol/week: 0.0 oz    Comment: occ  . Drug use: No  . Sexual activity: Yes    Partners: Male    Birth control/protection: None  Other Topics Concern  . Not on file  Social History Narrative  . Not on file   Review of Systems  Constitutional: Negative.   HENT: Negative.   Eyes: Negative.   Respiratory: Negative.   Cardiovascular: Negative.   Gastrointestinal: Negative.  Negative for blood in stool and constipation.  Genitourinary: Negative.   Musculoskeletal: Negative.   Skin: Negative.   Neurological: Positive for tingling ("pins and needles" ). Negative for dizziness and headaches.  Endo/Heme/Allergies: Negative.   Psychiatric/Behavioral: Positive for depression.   Physical Exam  Constitutional: She is oriented to person, place, and time.  HENT:  Head: Normocephalic and atraumatic.  Right Ear: External ear normal.  Left Ear: External ear normal.  Nose: Nose normal.  Mouth/Throat: Oropharynx is clear and moist.  Eyes: Pupils are equal, round, and reactive to light.  Neck: Normal range of motion.  Pulmonary/Chest: Effort normal and breath sounds normal.  Abdominal: Soft. Bowel sounds are normal.  Musculoskeletal: Normal range of motion.  Neurological: She is alert and oriented to person, place, and time. She has normal reflexes. She is not agitated and not disoriented. She displays no weakness, no atrophy, no tremor, normal stance and normal reflexes. No cranial nerve deficit or sensory deficit. She has  a normal Romberg Test. She shows no pronator drift. Gait normal. Gait normal.  Skin: Skin is warm and dry.  Psychiatric: Mood, memory, affect and judgment normal.   Plan   1. Neuropathy Recommend the patient continue to follow up with neurology.  We will continue Lyrica 100 mg twice daily as previously prescribed.  We will also start a trial of Cymbalta 20 mg daily to help with this problem.  2. Depression, unspecified depression type Recommend that patient  schedule a follow-up appointment with psychiatry. - DULoxetine (CYMBALTA) 20 MG capsule; Take 1 capsule (20 mg total) by mouth daily.  Dispense: 30 capsule; Refill: 3  3. Influenza vaccination given - Flu Vaccine QUAD 36+ mos IM (Fluarix & Fluzone Quad PF  4. Seizure disorder [facility](HCC) Patient reports that she has not had a seizure in greater than 6 months.  She is currently not on antiseizure medications and routinely follows up in neurology.    RTC: 1 month for depression   Nolon NationsLaChina Moore Hollis  MSN, FNP-C [facility] [address]  [place], [place] [id number] [phone]

## 2017-02-16 ENCOUNTER — Ambulatory Visit: Payer: Medicaid Other | Admitting: Family Medicine

## 2017-03-11 ENCOUNTER — Encounter: Payer: Self-pay | Admitting: Neurology

## 2017-03-13 ENCOUNTER — Encounter: Payer: Self-pay | Admitting: Neurology

## 2017-03-22 ENCOUNTER — Ambulatory Visit: Payer: Medicaid Other | Admitting: Neurology

## 2017-04-03 ENCOUNTER — Encounter (HOSPITAL_BASED_OUTPATIENT_CLINIC_OR_DEPARTMENT_OTHER): Payer: Self-pay | Admitting: Emergency Medicine

## 2017-04-03 ENCOUNTER — Emergency Department (HOSPITAL_BASED_OUTPATIENT_CLINIC_OR_DEPARTMENT_OTHER)
Admission: EM | Admit: 2017-04-03 | Discharge: 2017-04-03 | Disposition: A | Discharge status: Home / Self Care | Payer: Medicaid Other | Attending: Emergency Medicine | Admitting: Emergency Medicine

## 2017-04-03 ENCOUNTER — Other Ambulatory Visit: Payer: Self-pay

## 2017-04-03 ENCOUNTER — Emergency Department (HOSPITAL_BASED_OUTPATIENT_CLINIC_OR_DEPARTMENT_OTHER): Payer: Medicaid Other

## 2017-04-03 DIAGNOSIS — Z79899 Other long term (current) drug therapy: Secondary | ICD-10-CM | POA: Insufficient documentation

## 2017-04-03 DIAGNOSIS — M533 Sacrococcygeal disorders, not elsewhere classified: Secondary | ICD-10-CM | POA: Insufficient documentation

## 2017-04-03 DIAGNOSIS — F17210 Nicotine dependence, cigarettes, uncomplicated: Secondary | ICD-10-CM | POA: Diagnosis not present

## 2017-04-03 HISTORY — DX: Transient cerebral ischemic attack, unspecified: G45.9

## 2017-04-03 HISTORY — DX: Conversion disorder with seizures or convulsions: F44.5

## 2017-04-03 HISTORY — DX: Unspecified convulsions: R56.9

## 2017-04-03 MED ORDER — ACETAMINOPHEN 325 MG PO TABS
650.0000 mg | ORAL_TABLET | Freq: Once | ORAL | Status: AC
  Administered 2017-04-03: 650 mg via ORAL
  Filled 2017-04-03: qty 2

## 2017-04-03 MED ORDER — IBUPROFEN 400 MG PO TABS
600.0000 mg | ORAL_TABLET | Freq: Once | ORAL | Status: AC
  Administered 2017-04-03: 13:00:00 600 mg via ORAL
  Filled 2017-04-03: qty 1

## 2017-04-03 NOTE — ED Triage Notes (Signed)
Pt states she fell out of her bed on Friday.  Doesn't remember hitting her tailbone but is having pain and increased numbness in both legs.  Pt able to ambulate, is able to void and is having bm.

## 2017-04-03 NOTE — ED Notes (Signed)
Patient transported to X-ray 

## 2017-04-04 NOTE — ED Provider Notes (Signed)
[facility] EMERGENCY DEPARTMENT Provider Note   CSN: [id number] Arrival date & time: 04/03/17  1054     History   Chief Complaint Chief Complaint  Patient presents with  . Tailbone Pain    HPI Rhyli Kubota is a 32 y.o. female.  HPI 32 year old female presents the emergency department with tailbone pain.  She states she slipped out of bed several days ago and continues to complain of pain at this time.  No fevers or chills.  Normal bowel movements.  Painful to sit on her bottom.  No other complaints.  Pain is moderate in severity worse with palpation and sitting   Past Medical History:  Diagnosis Date  . Chronic back pain   . Depression   . Headache   . Neuropathy   . Neuropathy   . Pseudoseizures   . Seizure (HCC)    pseudo seizures  . TIA (transient ischemic attack)   . Vertigo     Patient Active Problem List   Diagnosis Date Noted  . 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 Chronic daily headache 08/17/2016  . Chronic pelvic pain in female 06/27/2016  . Bilateral mastodynia 06/27/2016  . Galactorrhea, bilateral 06/27/2016  . Low back pain radiating to both legs 12/29/2015  . Convulsions (HCC) 06/08/2015  . Generalized anxiety disorder 06/08/2015  . Chronic pain syndrome 06/08/2015  . Migraine without aura and without status migrainosus, not intractable 06/08/2015  . Nonintractable epilepsy with status epilepticus (HCC) 05/28/2015  . Seizure disorder (HCC) 05/28/2015  . Depression 05/28/2015  . Obstructive sleep apnea 05/28/2015  . Tobacco dependence 05/28/2015  . Neuropathy     Past Surgical History:  Procedure Laterality Date  . CESAREAN SECTION  2011  . OVARIAN CYST SURGERY      OB History    Gravida Para Term Preterm AB Living   2 1 1   1 1    SAB TAB Ectopic Multiple Live Births   1       1       Home Medications    Prior to Admission medications   Medication Sig Start Date End Date Taking? Authorizing  Provider  oxyCODONE (OXY IR/ROXICODONE) 5 MG immediate release tablet Take 5 mg by mouth 2 (two) times daily.   Yes [provider]  pregabalin (LYRICA) 100 MG capsule Take 100 mg by mouth 2 (two) times daily.   Yes [provider]  Prenat-FeAsp-Meth-FA-DHA w/o A (PRENATE PIXIE) 10-0.6-0.4-200 MG CAPS Take 1 capsule by mouth daily before breakfast. 08/30/16  Yes Brock Bad, MD  tizanidine (ZANAFLEX) 2 MG capsule Take 1 capsule (2 mg total) by mouth 3 (three) times daily as needed for muscle spasms. 10/19/16  Yes Caccavale, Sophia, PA-C    Family History Family History  Problem Relation Age of Onset  . Neuropathy Mother   . Heart disease Maternal Grandmother   . Diabetes Maternal Grandmother   . Breast cancer Maternal Aunt     Social History Social History   Tobacco Use  . Smoking status: Current Every Day Smoker    Packs/day: 0.25    Types: Cigarettes  . Smokeless tobacco: Never Used  . Tobacco comment: 4-5 per day   Substance Use Topics  . Alcohol use: Yes    Alcohol/week: 0.0 oz    Comment: occ  . Drug use: No     Allergies   Pineapple   Review of Systems Review of Systems  All other systems reviewed and are  negative.    Physical Exam Updated Vital Signs BP 124/75 (BP Location: Left Arm)   Pulse 64   Temp 98.4 F (36.9 C)   Resp 18   Ht 5\' 2"  (1.575 m)   Wt 104.3 kg (230 lb)   LMP 03/26/2017   SpO2 100%   BMI 42.07 kg/m   Physical Exam  Constitutional: She is oriented to person, place, and time. She appears well-developed and well-nourished.  HENT:  Head: Normocephalic.  Eyes: EOM are normal.  Neck: Normal range of motion.  Pulmonary/Chest: Effort normal.  Abdominal: She exhibits no distension.  Musculoskeletal: Normal range of motion.  Sacral and coccygeal tenderness without signs of perirectal abscess.  Neurological: She is alert and oriented to person, place, and time.  Psychiatric: She has a normal mood and affect.    Nursing note and vitals reviewed.    ED Treatments / Results  Labs (all labs ordered are listed, but only abnormal results are displayed) Labs Reviewed - No data to display  EKG  EKG Interpretation None       Radiology Dg Sacrum/coccyx  Result Date: 04/03/2017 CLINICAL DATA:  Severe tailbone pain after fall 3 days ago. EXAM: SACRUM AND COCCYX - 2+ VIEW COMPARISON:  None. FINDINGS: There is no evidence of fracture or other focal bone lesions. IMPRESSION: Normal sacrum and coccyx. Electronically Signed   By: Lupita RaiderJames  Green Jr, M.D.   On: 04/03/2017 12:51    Procedures Procedures (including critical care time)  Medications Ordered in ED Medications  ibuprofen (ADVIL,MOTRIN) tablet 600 mg (600 mg Oral Given 04/03/17 1236)  acetaminophen (TYLENOL) tablet 650 mg (650 mg Oral Given 04/03/17 1237)     Initial Impression / Assessment and Plan / ED Course  I have reviewed the triage vital signs and the nursing notes.  Pertinent labs & imaging results that were available during my care of the patient were reviewed by me and considered in my medical decision making (see chart for details).     Likely contusion.  Symptomatic management.  No signs of infection.  Final Clinical Impressions(s) / ED Diagnoses   Final diagnoses:  Coccygeal pain, acute    ED Discharge Orders    None       Azalia Bilisampos, Ryver Poblete, MD 04/04/17 1045

## 2017-04-07 ENCOUNTER — Encounter: Payer: Self-pay | Admitting: Family Medicine

## 2017-06-07 ENCOUNTER — Encounter: Payer: Self-pay | Admitting: Obstetrics

## 2017-07-10 ENCOUNTER — Encounter: Payer: Self-pay | Admitting: Neurology

## 2017-07-11 ENCOUNTER — Ambulatory Visit: Payer: Medicaid Other | Admitting: Neurology

## 2017-08-14 ENCOUNTER — Encounter: Payer: Self-pay | Admitting: Obstetrics

## 2017-08-17 ENCOUNTER — Encounter

## 2017-08-17 ENCOUNTER — Encounter: Payer: Self-pay | Admitting: Neurology

## 2017-08-17 ENCOUNTER — Other Ambulatory Visit: Payer: Self-pay

## 2017-08-17 ENCOUNTER — Ambulatory Visit: Payer: Medicaid Other | Admitting: Neurology

## 2017-08-17 VITALS — BP 110/76 | HR 61 | Ht 62.0 in | Wt 240.0 lb

## 2017-08-17 DIAGNOSIS — M5440 Lumbago with sciatica, unspecified side: Secondary | ICD-10-CM

## 2017-08-17 DIAGNOSIS — R29898 Other symptoms and signs involving the musculoskeletal system: Secondary | ICD-10-CM

## 2017-08-17 DIAGNOSIS — R200 Anesthesia of skin: Secondary | ICD-10-CM | POA: Diagnosis not present

## 2017-08-17 DIAGNOSIS — F445 Conversion disorder with seizures or convulsions: Secondary | ICD-10-CM

## 2017-08-17 NOTE — Patient Instructions (Addendum)
1. Schedule MRI lumbar spine without contrast. If significant changes, we will send referral to Ortho 2. Discuss continued pain management and ?fibromyalgia with your Pain Specialist and PCP 3. Proceed with finding a new Psychiatrist and therapist 4. Follow-up in 6 months, call for any changes
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
## 2017-08-29 ENCOUNTER — Encounter: Payer: Self-pay | Admitting: Family Medicine

## 2017-08-29 ENCOUNTER — Encounter: Payer: Self-pay | Admitting: Obstetrics

## 2017-08-29 ENCOUNTER — Ambulatory Visit: Payer: Medicaid Other | Admitting: Obstetrics

## 2017-08-29 ENCOUNTER — Other Ambulatory Visit: Payer: Self-pay | Admitting: Family

## 2017-08-29 ENCOUNTER — Other Ambulatory Visit (HOSPITAL_COMMUNITY)
Admission: RE | Admit: 2017-08-29 | Discharge: 2017-08-29 | Disposition: A | Discharge status: Home / Self Care | Payer: Medicaid Other | Source: Ambulatory Visit | Attending: Obstetrics | Admitting: Obstetrics

## 2017-08-29 VITALS — BP 136/92 | HR 72 | Ht 62.0 in | Wt 242.6 lb

## 2017-08-29 DIAGNOSIS — G8929 Other chronic pain: Secondary | ICD-10-CM | POA: Insufficient documentation

## 2017-08-29 DIAGNOSIS — R102 Pelvic and perineal pain: Secondary | ICD-10-CM | POA: Diagnosis present

## 2017-08-29 DIAGNOSIS — N946 Dysmenorrhea, unspecified: Secondary | ICD-10-CM | POA: Diagnosis not present

## 2017-08-29 MED ORDER — PRENATAL VITAMINS 0.8 MG PO TABS: 1.0000 | ORAL_TABLET | Freq: Every day | ORAL | 12 refills | Status: DC

## 2017-08-29 MED ORDER — IBUPROFEN 600 MG PO TABS: 600.0000 mg | ORAL_TABLET | Freq: Four times a day (QID) | ORAL | 0 refills | Status: DC | PRN

## 2017-08-29 MED ORDER — IBUPROFEN 600 MG PO TABS: ORAL_TABLET | ORAL | 0 refills | Status: DC

## 2017-08-29 NOTE — Progress Notes (Signed)
Presents for abdominal cramping before periods 10/10 x 2 months.  Trying to conceive for 1 yr. Having discharge in both breast and pain in CS scar.

## 2017-08-29 NOTE — Progress Notes (Signed)
  Subjective:     Angela Doyle is a 32 y.o. female who presents for evaluation of abdominal pain. The pain is described as aching and sharp, and is 9/10 in intensity. Pain is located in the bilateral pelvis area without radiation. Onset was "long time ago". Symptoms have been stable since. Aggravating factors: none. Alleviating factors: none. Associated symptoms: none. The patient denies dysuria, vomiting and abnormal discharge. Risk factors for pelvic/abdominal pain include prior pelvic surgery.  Pt reports pain occurs only with ovulation.  Denies abnormal vaginal discharge.    Menstrual History: OB History    Gravida  2   Para  1   Term  1   Preterm      AB  1   Living  1     SAB  1   TAB      Ectopic      Multiple      Live Births  1          Patient's last menstrual period was 08/10/2017 (exact date).    The following portions of the patient's history were reviewed and updated as appropriate: allergies, current medications, past family history, past medical history, past social history, past surgical history and problem list.   Review of Systems Pertinent items are noted in HPI.    Objective:    BP (!) 136/92   Pulse 72   Ht 5\' 2"  (1.575 m)   Wt 242 lb 9.6 oz (110 kg)   LMP 08/10/2017 (Exact Date)   BMI 44.37 kg/m  Exam completed by Dr. Clearance CootsHarper Lab Review  Imaging Prior ultrasound in October 2018 > normal     Assessment:    Ovulation Pain  Plan:    Recommended ibuprofen 600 mg day before ovulation scheduled.   Take one daily for 6 days Follow-up in 6 weeks  Marlis EdelsonKarim, Geovanny Sartin N, [place]

## 2017-08-30 LAB — CERVICOVAGINAL ANCILLARY ONLY
CHLAMYDIA, DNA PROBE: NEGATIVE
Neisseria Gonorrhea: NEGATIVE

## 2017-09-09 ENCOUNTER — Other Ambulatory Visit: Payer: Medicaid Other

## 2017-09-14 ENCOUNTER — Inpatient Hospital Stay: Admission: RE | Admit: 2017-09-14 | Payer: Medicaid Other | Source: Ambulatory Visit

## 2017-09-26 ENCOUNTER — Telehealth: Payer: Self-pay | Admitting: Family Medicine

## 2017-09-26 NOTE — Telephone Encounter (Signed)
Pt called states she needs to PICK UP LETTER TODAY stating  Patient has been evaluated in the emergency department on several occasions for epilepsy. Patient can return to work upon clearance from neurology. .  Pt states same exact letter written by Hart RochesterHollis 05/28/2015. Call pt [phone] when ready for her to pick up today for work so her medical ins will not end.

## 2017-09-27 ENCOUNTER — Ambulatory Visit (INDEPENDENT_AMBULATORY_CARE_PROVIDER_SITE_OTHER): Payer: Medicaid Other | Admitting: Family Medicine

## 2017-09-27 ENCOUNTER — Encounter: Payer: Self-pay | Admitting: Family Medicine

## 2017-09-27 VITALS — BP 118/66 | HR 78 | Temp 98.7°F | Resp 16 | Ht 62.0 in | Wt 240.0 lb

## 2017-09-27 DIAGNOSIS — I10 Essential (primary) hypertension: Secondary | ICD-10-CM | POA: Diagnosis not present

## 2017-09-27 DIAGNOSIS — M545 Low back pain, unspecified: Secondary | ICD-10-CM

## 2017-09-27 DIAGNOSIS — F445 Conversion disorder with seizures or convulsions: Secondary | ICD-10-CM

## 2017-09-27 DIAGNOSIS — F17200 Nicotine dependence, unspecified, uncomplicated: Secondary | ICD-10-CM

## 2017-09-27 DIAGNOSIS — G629 Polyneuropathy, unspecified: Secondary | ICD-10-CM | POA: Diagnosis not present

## 2017-09-27 MED ORDER — FUROSEMIDE 20 MG PO TABS: 20.0000 mg | ORAL_TABLET | Freq: Every day | ORAL | 2 refills | Status: DC

## 2017-09-27 NOTE — Progress Notes (Signed)
Subjective   Angela Doyle 32 y.o. female  [id number]  [id number]  Oct 21, 1985 32 y.o.     Chief Complaint  Patient presents with  . Follow-up    needs updated letter for being out of work     Patient presents for letter stating that she cannot work. 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 Patient states that she is being seen by neurology and pain management for this. Patient states that this is "literally the last day that I have for this letter". The letter is for her to re-certify for medicaid. Patient states that she does not have epilepsy. She has conversion disorder and is being followed by psychiatry.  Patient denies bowel incontinence. Minor urinary incontinence.  Denies chest pain, SOB. + dizziness and leg swelling. Patient states that she has not been taking Lasix as previously prescribed and has noticed bilateral leg swelling.       Past Medical History:  Diagnosis Date  . Chronic back pain   . Depression   . Headache   . Neuropathy   . Neuropathy   . Pseudoseizures   . Seizure (HCC)    pseudo seizures  . TIA (transient ischemic attack)   . Vertigo     Social History   Socioeconomic History  . Marital status: Single    Spouse name: Not on file  . Number of children: 1  . Years of education: Not on file  . Highest education level: Not on file  Occupational History  . Occupation: Un employed  Social Needs  . Financial resource strain: Not on file  . Food insecurity:    Worry: Not on file    Inability: Not on file  . Transportation needs:    Medical: Not on file    Non-medical: Not on file  Tobacco Use  . Smoking status: Current Every Day Smoker    Packs/day: 0.25    Types: Cigarettes  . Smokeless tobacco: Never Used  . Tobacco comment: 4-5 per day   Substance and Sexual Activity  . Alcohol use: Yes    Alcohol/week: 0.0 oz   Comment: occ  . Drug use: No  . Sexual activity: Yes    Partners: Male    Birth control/protection: None  Lifestyle  . Physical activity:    Days per week: Not on file    Minutes per session: Not on file  . Stress: Not on file  Relationships  . Social connections:    Talks on phone: Not on file    Gets together: Not on file    Attends religious service: Not on file    Active member of club or organization: Not on file    Attends meetings of clubs or organizations: Not on file    Relationship status: Not on file  . Intimate partner violence:    Fear of current or ex partner: Not on file    Emotionally abused: Not on file    Physically abused: Not on file    Forced sexual activity: Not on file  Other Topics Concern  . Not on file  Social History Narrative  . Not on file      Review of Systems  Constitutional: Negative.   HENT: Negative.   Eyes: Negative.   Respiratory: Negative.   Cardiovascular: Positive for leg swelling.  Gastrointestinal: Negative.   Genitourinary: Negative.   Musculoskeletal:  Positive for back pain, joint pain and myalgias.  Skin: Negative.   Neurological: Positive for dizziness.  Psychiatric/Behavioral: Negative.     Objective   Physical Exam  Constitutional: She is oriented to person, place, and time. She appears well-developed and well-nourished. No distress.  HENT:  Head: Normocephalic and atraumatic.  Eyes: Pupils are equal, round, and reactive to light. Conjunctivae and EOM are normal.  Neck: Normal range of motion. Neck supple. No JVD present. No thyromegaly present.  Cardiovascular: Normal rate, regular rhythm, normal heart sounds and intact distal pulses. Exam reveals no gallop and no friction rub.  No murmur heard. Pulmonary/Chest: Effort normal and breath sounds normal. No respiratory distress. She has no wheezes. She has no rales. She exhibits no tenderness.  Abdominal: Soft. Bowel sounds are normal. She exhibits no distension and  no mass. There is no tenderness.  Musculoskeletal: Normal range of motion.  No edema noted today.    Lymphadenopathy:    She has no cervical adenopathy.  Neurological: She is alert and oriented to person, place, and time.  Skin: Skin is warm and dry.  Psychiatric: She has a normal mood and affect. Her behavior is normal. Judgment and thought content normal.  Nursing note and vitals reviewed.   BP 118/66 (BP Location: Left Arm, Patient Position: Sitting, Cuff Size: Large)   Pulse 78   Temp 98.7 F (37.1 C) (Oral)   Resp 16   Ht 5\' 2"  (1.575 m)   Wt 240 lb (108.9 kg)   LMP 09/22/2017   SpO2 100%   BMI 43.90 kg/m   Assessment   Encounter Diagnoses  Name Primary?  . Tobacco dependence   . Conversion disorder with seizures or convulsions   . Neuropathy   . Acute midline low back pain without sciatica   . Hypertension, unspecified type Yes     Plan  1. Tobacco dependence Encouraged to quit. Patient is not interested at this time  2. Conversion disorder with seizures or convulsions Continue follow ups with psychiatry  3. Neuropathy Continue with current medications   4. Acute midline low back pain without sciatica Continue with current medications   5. Hypertension, unspecified type Restart lasix. DASH diet. Labs ordered today.   - CBC with Differential - Basic Metabolic Panel - furosemide (LASIX) 20 MG tablet; Take 1 tablet (20 mg total) by mouth daily.  Dispense: 30 tablet; Refill: 2     This note has been created with Education officer, environmentalDragon speech recognition software and smart phrase technology. Any transcriptional errors are unintentional.

## 2017-09-27 NOTE — Patient Instructions (Signed)
Edema Edema is when you have too much fluid in your body or under your skin. Edema may make your legs, feet, and ankles swell up. Swelling is also common in looser tissues, like around your eyes. This is a common condition. It gets more common as you get older. There are many possible causes of edema. Eating too much salt (sodium) and being on your feet or sitting for a long time can cause edema in your legs, feet, and ankles. Hot weather may make edema worse. Edema is usually painless. Your skin may look swollen or shiny. Follow these instructions at home:  Keep the swollen body part raised (elevated) above the level of your heart when you are sitting or lying down.  Do not sit still or stand for a long time.  Do not wear tight clothes. Do not wear garters on your upper legs.  Exercise your legs. This can help the swelling go down.  Wear elastic bandages or support stockings as told by your doctor.  Eat a low-salt (low-sodium) diet to reduce fluid as told by your doctor.  Depending on the cause of your swelling, you may need to limit how much fluid you drink (fluid restriction).  Take over-the-counter and prescription medicines only as told by your doctor. Contact a doctor if:  Treatment is not working.  You have heart, liver, or kidney disease and have symptoms of edema.  You have sudden and unexplained weight gain. Get help right away if:  You have shortness of breath or chest pain.  You cannot breathe when you lie down.  You have pain, redness, or warmth in the swollen areas.  You have heart, liver, or kidney disease and get edema all of a sudden.  You have a fever and your symptoms get worse all of a sudden. Summary  Edema is when you have too much fluid in your body or under your skin.  Edema may make your legs, feet, and ankles swell up. Swelling is also common in looser tissues, like around your eyes.  Raise (elevate) the swollen body part above the level of your  heart when you are sitting or lying down.  Follow your doctor's instructions about diet and how much fluid you can drink (fluid restriction). This information is not intended to replace advice given to you by your health care provider. Make sure you discuss any questions you have with your health care provider. Document Released: 08/03/2007 Document Revised: 03/04/2016 Document Reviewed: 03/04/2016 Elsevier Interactive Patient Education  2017 Elsevier Inc.  

## 2017-09-28 LAB — CBC WITH DIFFERENTIAL/PLATELET
Basophils Absolute: 0 10*3/uL (ref 0.0–0.2)
Basos: 1 %
EOS (ABSOLUTE): 0.1 10*3/uL (ref 0.0–0.4)
Eos: 2 %
Hematocrit: 42.6 % (ref 34.0–46.6)
Hemoglobin: 14.1 g/dL (ref 11.1–15.9)
Immature Grans (Abs): 0 10*3/uL (ref 0.0–0.1)
Immature Granulocytes: 0 %
Lymphocytes Absolute: 2.7 10*3/uL (ref 0.7–3.1)
Lymphs: 45 %
MCH: 30.5 pg (ref 26.6–33.0)
MCHC: 33.1 g/dL (ref 31.5–35.7)
MCV: 92 fL (ref 79–97)
Monocytes Absolute: 0.5 10*3/uL (ref 0.1–0.9)
Monocytes: 8 %
Neutrophils Absolute: 2.6 10*3/uL (ref 1.4–7.0)
Neutrophils: 44 %
Platelets: 211 10*3/uL (ref 150–450)
RBC: 4.63 x10E6/uL (ref 3.77–5.28)
RDW: 12.7 % (ref 12.3–15.4)
WBC: 6.1 10*3/uL (ref 3.4–10.8)

## 2017-09-28 LAB — BASIC METABOLIC PANEL
BUN/Creatinine Ratio: 15 (ref 9–23)
BUN: 15 mg/dL (ref 6–20)
CO2: 22 mmol/L (ref 20–29)
Calcium: 9.1 mg/dL (ref 8.7–10.2)
Chloride: 105 mmol/L (ref 96–106)
Creatinine, Ser: 1.03 mg/dL — ABNORMAL HIGH (ref 0.57–1.00)
GFR calc Af Amer: 85 mL/min/{1.73_m2} (ref >59)
GFR calc non Af Amer: 74 mL/min/{1.73_m2} (ref >59)
Glucose: 85 mg/dL (ref 65–99)
Potassium: 4.3 mmol/L (ref 3.5–5.2)
Sodium: 141 mmol/L (ref 134–144)

## 2017-10-10 ENCOUNTER — Ambulatory Visit: Payer: Medicaid Other | Admitting: Obstetrics

## 2018-02-16 ENCOUNTER — Ambulatory Visit: Payer: Medicaid Other | Admitting: Family Medicine

## 2018-03-07 ENCOUNTER — Telehealth: Payer: Self-pay

## 2018-03-08 NOTE — Telephone Encounter (Signed)
Patient wants to cancel appointment for 03/16/2018. She states she is will not be coming back to this clinic and is going to find a new provider. Thanks !

## 2018-03-08 NOTE — Telephone Encounter (Signed)
Called and spoke with patient, she states her fibromyalgia is flaring-up. She is asking what can she do to get some relief until her appointment on 03/16/2018? She says tylenol and ibuprofen otc do not help. Please advise.

## 2018-03-12 ENCOUNTER — Encounter: Payer: Self-pay | Admitting: Family Medicine

## 2018-03-12 ENCOUNTER — Ambulatory Visit: Payer: Medicaid Other | Admitting: Family Medicine

## 2018-03-12 ENCOUNTER — Ambulatory Visit (INDEPENDENT_AMBULATORY_CARE_PROVIDER_SITE_OTHER): Payer: Medicaid Other | Admitting: Family Medicine

## 2018-03-12 VITALS — BP 122/66 | HR 66 | Temp 98.7°F | Resp 16 | Ht 62.0 in | Wt 243.0 lb

## 2018-03-12 DIAGNOSIS — G8929 Other chronic pain: Secondary | ICD-10-CM

## 2018-03-12 DIAGNOSIS — M797 Fibromyalgia: Secondary | ICD-10-CM | POA: Diagnosis not present

## 2018-03-12 MED ORDER — NAPROXEN 500 MG PO TABS: 500.0000 mg | ORAL_TABLET | Freq: Two times a day (BID) | ORAL | 0 refills | Status: AC

## 2018-03-12 MED ORDER — ACETAMINOPHEN-CODEINE #3 300-30 MG PO TABS: 1.0000 | ORAL_TABLET | Freq: Three times a day (TID) | ORAL | 0 refills | Status: AC | PRN

## 2018-03-12 MED ORDER — DULOXETINE HCL 30 MG PO CPEP: 30.0000 mg | ORAL_CAPSULE | Freq: Every day | ORAL | 0 refills | Status: DC

## 2018-03-12 MED ORDER — PREGABALIN 200 MG PO CAPS: 200.0000 mg | ORAL_CAPSULE | Freq: Two times a day (BID) | ORAL | 0 refills | Status: DC

## 2018-03-12 NOTE — Patient Instructions (Signed)
Myofascial Pain Syndrome and Fibromyalgia Myofascial pain syndrome and fibromyalgia are both pain disorders. This pain may be felt mainly in your muscles.  Myofascial pain syndrome: ? Always has tender points in the muscle that will cause pain when pressed (trigger points). The pain may come and go. ? Usually affects your neck, upper back, and shoulder areas. The pain often radiates into your arms and hands.  Fibromyalgia: ? Has muscle pains and tenderness that come and go. ? Is often associated with fatigue and sleep problems. ? Has trigger points. ? Tends to be long-lasting (chronic), but is not life-threatening. Fibromyalgia and myofascial pain syndrome are not the same. However, they often occur together. If you have both conditions, each can make the other worse. Both are common and can cause enough pain and fatigue to make day-to-day activities difficult. Both can be hard to diagnose because their symptoms are common in many other conditions. What are the causes? The exact causes of these conditions are not known. What increases the risk? You are more likely to develop this condition if:  You have a family history of the condition.  You have certain triggers, such as: ? Spine disorders. ? An injury (trauma) or other physical stressors. ? Being under a lot of stress. ? Medical conditions such as osteoarthritis, rheumatoid arthritis, or lupus. What are the signs or symptoms? Fibromyalgia The main symptom of fibromyalgia is widespread pain and tenderness in your muscles. Pain is sometimes described as stabbing, shooting, or burning. You may also have:  Tingling or numbness.  Sleep problems and fatigue.  Problems with attention and concentration (fibro fog). Other symptoms may include:  Bowel and bladder problems.  Headaches.  Visual problems.  Problems with odors and noises.  Depression or mood changes.  Painful menstrual periods (dysmenorrhea).  Dry skin or  eyes. These symptoms can vary over time. Myofascial pain syndrome Symptoms of myofascial pain syndrome include:  Tight, ropy bands of muscle.  Uncomfortable sensations in muscle areas. These may include aching, cramping, burning, numbness, tingling, and weakness.  Difficulty moving certain parts of the body freely (poor range of motion). How is this diagnosed? This condition may be diagnosed by your symptoms and medical history. You will also have a physical exam. In general:  Fibromyalgia is diagnosed if you have pain, fatigue, and other symptoms for more than 3 months, and symptoms cannot be explained by another condition.  Myofascial pain syndrome is diagnosed if you have trigger points in your muscles, and those trigger points are tender and cause pain elsewhere in your body (referred pain). How is this treated? Treatment for these conditions depends on the type that you have.  For fibromyalgia: ? Pain medicines, such as NSAIDs. ? Medicines for treating depression. ? Medicines for treating seizures. ? Medicines that relax the muscles.  For myofascial pain: ? Pain medicines, such as NSAIDs. ? Cooling and stretching of muscles. ? Trigger point injections. ? Sound wave (ultrasound) treatments to stimulate muscles. Treating these conditions often requires a team of health care providers. These may include:  Your primary care provider.  Physical therapist.  Complementary health care providers, such as massage therapists or acupuncturists.  Psychiatrist for cognitive behavioral therapy. Follow these instructions at home: Medicines  Take over-the-counter and prescription medicines only as told by your health care provider.  Do not drive or use heavy machinery while taking prescription pain medicine.  If you are taking prescription pain medicine, take actions to prevent or treat constipation. Your health care  massage therapists or acupuncturists.   Psychiatrist for cognitive behavioral therapy.  Follow these instructions at home:  Medicines   Take over-the-counter and prescription medicines only as told by your health care provider.   Do not drive or use heavy machinery while taking prescription pain medicine.   If you are taking prescription pain medicine, take actions to prevent or treat constipation. Your health care provider may recommend that you:  ? Drink enough fluid to keep  your urine pale yellow.  ? Eat foods that are high in fiber, such as fresh fruits and vegetables, whole grains, and beans.  ? Limit foods that are high in fat and processed sugars, such as fried or sweet foods.  ? Take an over-the-counter or prescription medicine for constipation.  Lifestyle     Exercise as directed by your health care provider or physical therapist.   Practice relaxation techniques to control your stress. You may want to try:  ? Biofeedback.  ? Visual imagery.  ? Hypnosis.  ? Muscle relaxation.  ? Yoga.  ? Meditation.   Maintain a healthy lifestyle. This includes eating a healthy diet and getting enough sleep.   Do not use any products that contain nicotine or tobacco, such as cigarettes and e-cigarettes. If you need help quitting, ask your health care provider.  General instructions   Talk to your health care provider about complementary treatments, such as acupuncture or massage.   Consider joining a support group with others who are diagnosed with this condition.   Do not do activities that stress or strain your muscles. This includes repetitive motions and heavy lifting.   Keep all follow-up visits as told by your health care provider. This is important.  Where to find more information   National Fibromyalgia Association: www.fmaware.org   Arthritis Foundation: www.arthritis.org   American Chronic Pain Association: www.theacpa.org  Contact a health care provider if:   You have new symptoms.   Your symptoms get worse or your pain is severe.   You have side effects from your medicines.   You have trouble sleeping.   Your condition is causing depression or anxiety.  Summary   Myofascial pain syndrome and fibromyalgia are pain disorders.   Myofascial pain syndrome has tender points in the muscle that will cause pain when pressed (trigger points). Fibromyalgia also has muscle pains and tenderness that come and go, but this condition is often associated with fatigue and sleep  disturbances.   Fibromyalgia and myofascial pain syndrome are not the same but often occur together, causing pain and fatigue that make day-to-day activities difficult.   Treatment for fibromyalgia includes taking medicines to relax the muscles and medicines for pain, depression, or seizures. Treatment for myofascial pain syndrome includes taking medicines for pain, cooling and stretching of muscles, and injecting medicines into trigger points.   Follow your health care provider's instructions for taking medicines and maintaining a healthy lifestyle.  This information is not intended to replace advice given to you by your health care provider. Make sure you discuss any questions you have with your health care provider.  Document Released: 02/14/2005 Document Revised: 03/01/2017 Document Reviewed: 03/01/2017  Elsevier Interactive Patient Education  2019 [employer]    Chronic Pain, Adult  Chronic pain is a type of pain that lasts or keeps coming back (recurs) for at least six months. You may have chronic headaches, abdominal pain, or body pain. Chronic pain may be related to an illness, such as fibromyalgia or complex regional pain   syndrome. Sometimes the cause of chronic pain is not known.  Chronic pain can make it hard for you to do daily activities. If not treated, chronic pain can lead to other health problems, including anxiety and depression. Treatment depends on the cause and severity of your pain. You may need to work with a pain specialist to come up with a treatment plan. The plan may include medicine, counseling, and physical therapy. Many people benefit from a combination of two or more types of treatment to control their pain.  Follow these instructions at home:  Lifestyle   Consider keeping a pain diary to share with your health care providers.   Consider talking with a mental health care provider (psychologist) about how to cope with chronic pain.   Consider joining a chronic pain support  group.   Try to control or lower your stress levels. Talk to your health care provider about strategies to do this.  General instructions     Take over-the-counter and prescription medicines only as told by your health care provider.   Follow your treatment plan as told by your health care provider. This may include:  ? Gentle, regular exercise.  ? Eating a healthy diet that includes foods such as vegetables, fruits, fish, and lean meats.  ? Cognitive or behavioral therapy.  ? Working with a physical therapist.  ? Meditation or yoga.  ? Acupuncture or massage therapy.  ? Aroma, color, light, or sound therapy.  ? Local electrical stimulation.  ? Shots (injections) of numbing or pain-relieving medicines into the spine or the area of pain.   Check your pain level as told by your health care provider. Ask your health care provider if you should use a pain scale.   Learn as much as you can about how to manage your chronic pain. Ask your health care provider if an intensive pain rehabilitation program or a chronic pain specialist would be helpful.   Keep all follow-up visits as told by your health care provider. This is important.  Contact a health care provider if:   Your pain gets worse.   You have new pain.   You have trouble sleeping.   You have trouble doing your normal activities.   Your pain is not controlled with treatment.   Your have side effects from pain medicine.   You feel weak.  Get help right away if:   You lose feeling or have numbness in your body.   You lose control of bowel or bladder function.   Your pain suddenly gets much worse.   You develop shaking or chills.   You develop confusion.   You develop chest pain.   You have trouble breathing or shortness of breath.   You pass out.   You have thoughts about hurting yourself or others.  This information is not intended to replace advice given to you by your health care provider. Make sure you discuss any questions you have with  your health care provider.  Document Released: 11/06/2001 Document Revised: 10/15/2015 Document Reviewed: 08/04/2015  Elsevier Interactive Patient Education  2019 [employer] Inc[employer]

## 2018-03-12 NOTE — Progress Notes (Signed)
[facility]   Progress Note: General Provider: Mike Gip, FNP  SUBJECTIVE:   Angela Doyle is a 33 y.o. female who  has a past medical history of Chronic back pain, Depression, Headache, Neuropathy, Neuropathy, Pseudoseizures, Seizure (HCC), TIA (transient ischemic attack), and Vertigo.. Patient presents today for Joint Pain (pain managment closed. ) Patient presents with her aunt who participates in this patient's history.   Patient presents due to chronic pain. Reports being seen  being seen at [facility] pain management clinic in [facility]. Patient seen in the ED 03/01/2018 and given gabapentin 300mg  po QD. Patient denies relief with this medication.  She reports being prescribed Lyrica 200mg  BID, zanaflex 2mg  QD, and oxycodone 7.5mg  TID. States that she has not had medications for over the past 2 months.  Patient states that she has been evaluated by neurology and has an appt at the end of the month.   Review of Systems  Constitutional: Negative.   HENT: Negative.   Eyes: Negative.   Respiratory: Negative.   Cardiovascular: Negative.   Gastrointestinal: Negative.   Genitourinary: Negative.   Musculoskeletal: Positive for back pain, joint pain, myalgias and neck pain. Negative for falls.  Skin: Negative.   Neurological: Negative.   Psychiatric/Behavioral: Negative.      OBJECTIVE: BP 122/66 (BP Location: Left Arm, Patient Position: Sitting, Cuff Size: Large)   Pulse 66   Temp 98.7 F (37.1 C) (Oral)   Resp 16   Ht 5\' 2"  (1.575 m)   Wt 243 lb (110.2 kg)   LMP 02/13/2018   SpO2 99%   BMI 44.45 kg/m   Wt Readings from Last 3 Encounters:  03/12/18 243 lb (110.2 kg)  09/27/17 240 lb (108.9 kg)  08/29/17 242 lb 9.6 oz (110 kg)     Physical Exam Vitals signs and nursing note reviewed.  Constitutional:      General: She is not in acute distress.    Appearance: She is well-developed.  HENT:     Head: Normocephalic and  atraumatic.  Eyes:     Conjunctiva/sclera: Conjunctivae normal.     Pupils: Pupils are equal, round, and reactive to light.  Neck:     Musculoskeletal: Normal range of motion.  Cardiovascular:     Rate and Rhythm: Normal rate and regular rhythm.     Heart sounds: Normal heart sounds.  Pulmonary:     Effort: Pulmonary effort is normal. No respiratory distress.     Breath sounds: Normal breath sounds.  Musculoskeletal:        General: Tenderness present.     Comments: Limited ROM of back due to pain.    Skin:    General: Skin is warm and dry.  Neurological:     Mental Status: She is alert and oriented to person, place, and time. Mental status is at baseline.  Psychiatric:        Mood and Affect: Mood normal.        Behavior: Behavior normal.        Thought Content: Thought content normal.        Judgment: Judgment normal.     ASSESSMENT/PLAN: 1. Fibromyalgia - Ambulatory referral to Pain Clinic - pregabalin (LYRICA) 200 MG capsule; Take 1 capsule (200 mg total) by mouth 2 (two) times daily for 30 days.  Dispense: 60 capsule; Refill: 0 - naproxen (NAPROSYN) 500 MG tablet; Take 1 tablet (500 mg total) by mouth 2 (two) times daily with a meal  for 30 days.  Dispense: 60 tablet; Refill: 0 - DULoxetine (CYMBALTA) 30 MG capsule; Take 1 capsule (30 mg total) by mouth daily. Take 1 tab po x 2 weeks then increase to 60mg  po QD. Can take at night  Dispense: 45 capsule; Refill: 0  2. Other chronic pain - Ambulatory referral to Pain Clinic - acetaminophen-codeine (TYLENOL #3) 300-30 MG tablet; Take 1 tablet by mouth every 8 (eight) hours as needed for up to 7 days for moderate pain.  Dispense: 21 tablet; Refill: 0 - naproxen (NAPROSYN) 500 MG tablet; Take 1 tablet (500 mg total) by mouth 2 (two) times daily with a meal for 30 days.  Dispense: 60 tablet; Refill: 0 - DULoxetine (CYMBALTA) 30 MG capsule; Take 1 capsule (30 mg total) by mouth daily. Take 1 tab po x 2 weeks then increase to 60mg   po QD. Can take at night  Dispense: 45 capsule; Refill: 0 The patient was given clear instructions to go to ER or return to medical center if symptoms do not improve, worsen or new problems develop. The patient verbalized understanding and agreed with plan of care.   Ms. Angela Jacksonndr L. Riley Lamouglas, FNP-BC [facility] [address]  [place], [place] [id number] [phone]

## 2018-03-14 ENCOUNTER — Ambulatory Visit: Payer: Medicaid Other | Admitting: Family Medicine

## 2018-03-16 ENCOUNTER — Ambulatory Visit: Payer: Self-pay | Admitting: Nurse Practitioner

## 2018-03-27 ENCOUNTER — Other Ambulatory Visit: Payer: Self-pay

## 2018-03-27 ENCOUNTER — Encounter: Payer: Self-pay | Admitting: Neurology

## 2018-03-27 ENCOUNTER — Ambulatory Visit: Payer: Medicaid Other | Admitting: Neurology

## 2018-03-27 VITALS — BP 134/76 | HR 82 | Ht 62.0 in | Wt 253.0 lb

## 2018-03-27 DIAGNOSIS — G894 Chronic pain syndrome: Secondary | ICD-10-CM

## 2018-03-27 DIAGNOSIS — R200 Anesthesia of skin: Secondary | ICD-10-CM | POA: Diagnosis not present

## 2018-03-27 DIAGNOSIS — R202 Paresthesia of skin: Secondary | ICD-10-CM | POA: Diagnosis not present

## 2018-03-27 DIAGNOSIS — M5440 Lumbago with sciatica, unspecified side: Secondary | ICD-10-CM | POA: Diagnosis not present
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
## 2018-03-27 NOTE — Patient Instructions (Addendum)
1. Schedule MRI lumbar spine without contrast  We have sent a referral to [facility] for your MRI and they will call you directly to schedule your appt. They are located at [address] [facility]. If you need to contact them directly please call [phone].  2. Schedule EMG/NCV of the left arm and leg with Dr. Allena Katz 3. Continue all your medications, follow-up with Pain Management, discuss cognitive behavioral therapy to help with how your brain deals with pain 4. Follow-up in 6-8 months, call for any changes

## 2018-04-09 ENCOUNTER — Ambulatory Visit: Payer: Medicaid Other | Admitting: Family Medicine

## 2018-04-10 ENCOUNTER — Encounter: Payer: Medicaid Other | Admitting: Neurology

## 2018-04-11 ENCOUNTER — Other Ambulatory Visit: Payer: Medicaid Other

## 2018-04-18 ENCOUNTER — Ambulatory Visit
Admission: RE | Admit: 2018-04-18 | Discharge: 2018-04-18 | Disposition: A | Discharge status: Home / Self Care | Payer: Medicaid Other | Source: Ambulatory Visit | Attending: Neurology | Admitting: Neurology

## 2018-04-18 DIAGNOSIS — M5440 Lumbago with sciatica, unspecified side: Secondary | ICD-10-CM

## 2018-04-18 DIAGNOSIS — R202 Paresthesia of skin: Secondary | ICD-10-CM

## 2018-04-18 DIAGNOSIS — G894 Chronic pain syndrome: Secondary | ICD-10-CM

## 2018-04-18 DIAGNOSIS — R200 Anesthesia of skin: Secondary | ICD-10-CM

## 2018-04-19 ENCOUNTER — Encounter: Payer: Medicaid Other | Admitting: Neurology

## 2018-04-23 ENCOUNTER — Other Ambulatory Visit: Payer: Self-pay

## 2018-04-23 ENCOUNTER — Encounter (HOSPITAL_BASED_OUTPATIENT_CLINIC_OR_DEPARTMENT_OTHER): Payer: Self-pay | Admitting: *Deleted

## 2018-04-23 ENCOUNTER — Emergency Department (HOSPITAL_BASED_OUTPATIENT_CLINIC_OR_DEPARTMENT_OTHER)
Admission: EM | Admit: 2018-04-23 | Discharge: 2018-04-23 | Disposition: A | Discharge status: Home / Self Care | Payer: Medicaid Other | Attending: Emergency Medicine | Admitting: Emergency Medicine

## 2018-04-23 DIAGNOSIS — M4800 Spinal stenosis, site unspecified: Secondary | ICD-10-CM | POA: Insufficient documentation

## 2018-04-23 DIAGNOSIS — Z8673 Personal history of transient ischemic attack (TIA), and cerebral infarction without residual deficits: Secondary | ICD-10-CM | POA: Diagnosis not present

## 2018-04-23 DIAGNOSIS — F17210 Nicotine dependence, cigarettes, uncomplicated: Secondary | ICD-10-CM | POA: Insufficient documentation

## 2018-04-23 DIAGNOSIS — M79662 Pain in left lower leg: Secondary | ICD-10-CM | POA: Insufficient documentation

## 2018-04-23 DIAGNOSIS — M79661 Pain in right lower leg: Secondary | ICD-10-CM | POA: Insufficient documentation

## 2018-04-23 DIAGNOSIS — Z79899 Other long term (current) drug therapy: Secondary | ICD-10-CM | POA: Insufficient documentation

## 2018-04-23 DIAGNOSIS — M545 Low back pain: Secondary | ICD-10-CM | POA: Diagnosis present

## 2018-04-23 HISTORY — DX: Fibromyalgia: M79.7

## 2018-04-23 MED ORDER — AMITRIPTYLINE HCL 10 MG PO TABS: 10.0000 mg | ORAL_TABLET | Freq: Every day | ORAL | 0 refills | Status: DC

## 2018-04-23 MED ORDER — METHYLPREDNISOLONE SODIUM SUCC 125 MG IJ SOLR
125.0000 mg | Freq: Once | INTRAMUSCULAR | Status: AC
  Administered 2018-04-23: 125 mg via INTRAMUSCULAR
  Filled 2018-04-23: qty 2

## 2018-04-23 MED ORDER — AMITRIPTYLINE HCL 25 MG PO TABS
25.0000 mg | ORAL_TABLET | ORAL | Status: DC
  Filled 2018-04-23: qty 1

## 2018-04-23 MED ORDER — PREDNISONE 10 MG PO TABS: 40.0000 mg | ORAL_TABLET | Freq: Every day | ORAL | 0 refills | Status: AC

## 2018-04-23 NOTE — ED Notes (Signed)
Pt reports having to walk with cane

## 2018-04-23 NOTE — ED Triage Notes (Signed)
Lower back pain with radiation into both legs. Her legs are numb and tingling. She had an MRI that showed cyst on her spine 4 days ago.

## 2018-04-23 NOTE — Discharge Instructions (Signed)
Thank you for allowing me to care for you today. Please return to the emergency department if you have new or worsening symptoms. Take your medications as instructed.  ° °

## 2018-04-23 NOTE — ED Provider Notes (Addendum)
[facility] EMERGENCY DEPARTMENT Provider Note   CSN: [id number] Arrival date & time: 04/23/18  1919    History   Chief Complaint Chief Complaint  Patient presents with  . Back Pain    HPI Angela Doyle is a 33 y.o. female.     Patient is a 33 year old female with past medical history of chronic back pain, spinal stenosis, neuropathy, pseudoseizures, vertigo, depression who presents emergency department for lower back pain radiating to her legs.  This is not a new problem.  This is been going on for several months.  She had a recent MRI that showed spinal stenosis and facet arthropathy.  She has been referred to a neurosurgeon.  She is currently taking Cymbalta and Lyrica.  She is not taking any steroid medication.  She is not taking any narcotic medication.  Reports that she is having a flareup of her pain, this is her usual pain.  Reports that the pain is in her lower back and radiates into the bilateral lower legs and feet.  She does have numbness and tingling.  She does not have saddle anesthesia, loss of control of bowel or bladder, fever, urinary retention     Past Medical History:  Diagnosis Date  . Chronic back pain   . Depression   . Fibromyalgia   . Headache   . Neuropathy   . Neuropathy   . Pseudoseizures   . Seizure (HCC)    pseudo seizures  . TIA (transient ischemic attack)   . Vertigo     Patient Active Problem List   Diagnosis Date Noted  . 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 Chronic daily headache 08/17/2016  . Chronic pelvic pain in female 06/27/2016  . Bilateral mastodynia 06/27/2016  . Galactorrhea, bilateral 06/27/2016  . Low back pain radiating to both legs 12/29/2015  . Convulsions (HCC) 06/08/2015  . Generalized anxiety disorder 06/08/2015  . Chronic pain syndrome 06/08/2015  . Migraine without aura and without status migrainosus, not intractable 06/08/2015  . Nonintractable epilepsy with status  epilepticus (HCC) 05/28/2015  . Seizure disorder (HCC) 05/28/2015  . Depression 05/28/2015  . Obstructive sleep apnea 05/28/2015  . Tobacco dependence 05/28/2015  . Neuropathy     Past Surgical History:  Procedure Laterality Date  . CESAREAN SECTION  2011  . OVARIAN CYST SURGERY       OB History    Gravida  2   Para  1   Term  1   Preterm      AB  1   Living  1     SAB  1   TAB      Ectopic      Multiple      Live Births  1            Home Medications    Prior to Admission medications   Medication Sig Start Date End Date Taking? Authorizing Provider  DULoxetine (CYMBALTA) 60 MG capsule TK ONE C PO D 03/15/18  Yes [provider]  meloxicam (MOBIC) 15 MG tablet TK 1/2 T PO BID PRN. MDD 15 MG 03/15/18  Yes [provider]  oxyCODONE-acetaminophen (PERCOCET) 7.5-325 MG tablet TK 1 T PO TID 03/23/18  Yes [provider]  pregabalin (LYRICA) 200 MG capsule Take 1 capsule (200 mg total) by mouth 2 (two) times daily for 30 days. 03/12/18 04/23/18 Yes Mike Gip, FNP  tizanidine (ZANAFLEX) 2 MG capsule Take 1 capsule (2 mg total) by mouth  3 (three) times daily as needed for muscle spasms. 10/19/16  Yes Caccavale, Sophia, PA-C  amitriptyline (ELAVIL) 10 MG tablet Take 1 tablet (10 mg total) by mouth at bedtime for 1 day. 04/23/18 04/24/18  Arlyn Dunning, PA-C  furosemide (LASIX) 20 MG tablet Take 1 tablet (20 mg total) by mouth daily. 09/27/17   Mike Gip, FNP  ibuprofen (ADVIL,MOTRIN) 600 MG tablet Take one tablet in AM day before ovulation is to start; repeat daily x 6 days. Patient not taking: Reported on 03/12/2018 08/29/17   Amedeo Gory, CNM  predniSONE (DELTASONE) 10 MG tablet Take 4 tablets (40 mg total) by mouth daily for 5 days. 04/23/18 04/28/18  Arlyn Dunning, PA-C    Family History Family History  Problem Relation Age of Onset  . Neuropathy Mother   . Heart disease Maternal Grandmother   . Diabetes Maternal  Grandmother   . Breast cancer Maternal Aunt     Social History Social History   Tobacco Use  . Smoking status: Current Every Day Smoker    Packs/day: 0.25    Types: Cigarettes  . Smokeless tobacco: Never Used  . Tobacco comment: 4-5 per day   Substance Use Topics  . Alcohol use: Yes    Alcohol/week: 0.0 standard drinks    Comment: occ  . Drug use: No     Allergies   Pineapple   Review of Systems Review of Systems  Constitutional: Negative for chills and fever.  HENT: Negative for ear pain and sore throat.   Eyes: Negative for pain and visual disturbance.  Respiratory: Negative for cough and shortness of breath.   Cardiovascular: Negative for chest pain and palpitations.  Gastrointestinal: Negative for abdominal pain and vomiting.  Genitourinary: Negative for difficulty urinating, dysuria and hematuria.  Musculoskeletal: Positive for back pain. Negative for arthralgias and myalgias.  Skin: Negative for color change and rash.  Neurological: Positive for weakness and numbness. Negative for dizziness, seizures and syncope.  All other systems reviewed and are negative.    Physical Exam Updated Vital Signs BP 123/71   Pulse 84   Temp 98.1 F (36.7 C) (Oral)   Resp 14   Ht 5\' 2"  (1.575 m)   Wt 108.9 kg   LMP 04/10/2018   SpO2 98%   BMI 43.90 kg/m   Physical Exam Vitals signs and nursing note reviewed.  Constitutional:      General: She is not in acute distress.    Appearance: Normal appearance. She is obese. She is not ill-appearing, toxic-appearing or diaphoretic.  HENT:     Head: Normocephalic.  Eyes:     Conjunctiva/sclera: Conjunctivae normal.  Pulmonary:     Effort: Pulmonary effort is normal.  Musculoskeletal:     Comments: Positive bilateral straight leg raise.  Equal lower extremity strength and sensation.  Diffuse tenderness to palpation of the entire lower lumbar paraspinal muscles.  No bony tenderness.  Skin:    General: Skin is dry.      Capillary Refill: Capillary refill takes less than 2 seconds.  Neurological:     Mental Status: She is alert.  Psychiatric:        Mood and Affect: Mood normal.      ED Treatments / Results  Labs (all labs ordered are listed, but only abnormal results are displayed) Labs Reviewed - No data to display  EKG None  Radiology No results found.  Procedures Procedures (including critical care time)  Medications Ordered in ED Medications  amitriptyline (  ELAVIL) tablet 25 mg (25 mg Oral Not Given 04/23/18 2149)  methylPREDNISolone sodium succinate (SOLU-MEDROL) 125 mg/2 mL injection 125 mg (125 mg Intramuscular Given 04/23/18 2135)     Initial Impression / Assessment and Plan / ED Course  I have reviewed the triage vital signs and the nursing notes.  Pertinent labs & imaging results that were available during my care of the patient were reviewed by me and considered in my medical decision making (see chart for details).  Clinical Course as of Apr 23 2205  Mon Apr 23, 2018  2157 Patient was evaluated for back pain today. Patient has no concerning symptoms or physical exam findings including no fever, no loss of control of bowel or bladder, no urinary retention, no saddle anesthesia, no leg weakness. She was given medication to treat her symptoms and advised to f/u with PMD for further workup including possible PT, medication change, further imaging, etc. She was advised on all concerning symptoms above and to return to the ED if any of them arise.    Patient insisted on something different from the nerve pain.  She is taking Cymbalta and Lyrica already.  She insisted on something to help her sleep tonight.  I will give her a one-time dose of amitriptyline.   [KM]    Clinical Course User Index [KM] Arlyn Dunning, PA-C       Based on review of vitals, medical screening exam, lab work and/or imaging, there does not appear to be an acute, emergent etiology for the patient's  symptoms. Counseled pt on good return precautions and encouraged both PCP and ED follow-up as needed.  Prior to discharge, I also discussed incidental imaging findings with patient in detail and advised appropriate, recommended follow-up in detail.  Clinical Impression: 1. Spinal stenosis, unspecified spinal region     Disposition: Discharge    This note was prepared with assistance of Dragon voice recognition software. Occasional wrong-word or sound-a-like substitutions may have occurred due to the inherent limitations of voice recognition software.   Final Clinical Impressions(s) / ED Diagnoses   Final diagnoses:  Spinal stenosis, unspecified spinal region    ED Discharge Orders         Ordered    predniSONE (DELTASONE) 10 MG tablet  Daily     04/23/18 2154    amitriptyline (ELAVIL) 10 MG tablet  Daily at bedtime     04/23/18 2207           Jeral Pinch 04/23/18 2158    Arlyn Dunning, PA-C 04/23/18 2207    Maia Plan, MD 04/24/18 1009

## 2018-08-20 IMAGING — DX DG SACRUM/COCCYX 2+V
3 series · 3 of 3 positions shown · non-contrast
Comparison: None.

CLINICAL DATA: Severe tailbone pain after fall 3 days ago.

EXAM:
SACRUM AND COCCYX - 2+ VIEW

[coccyx ap]
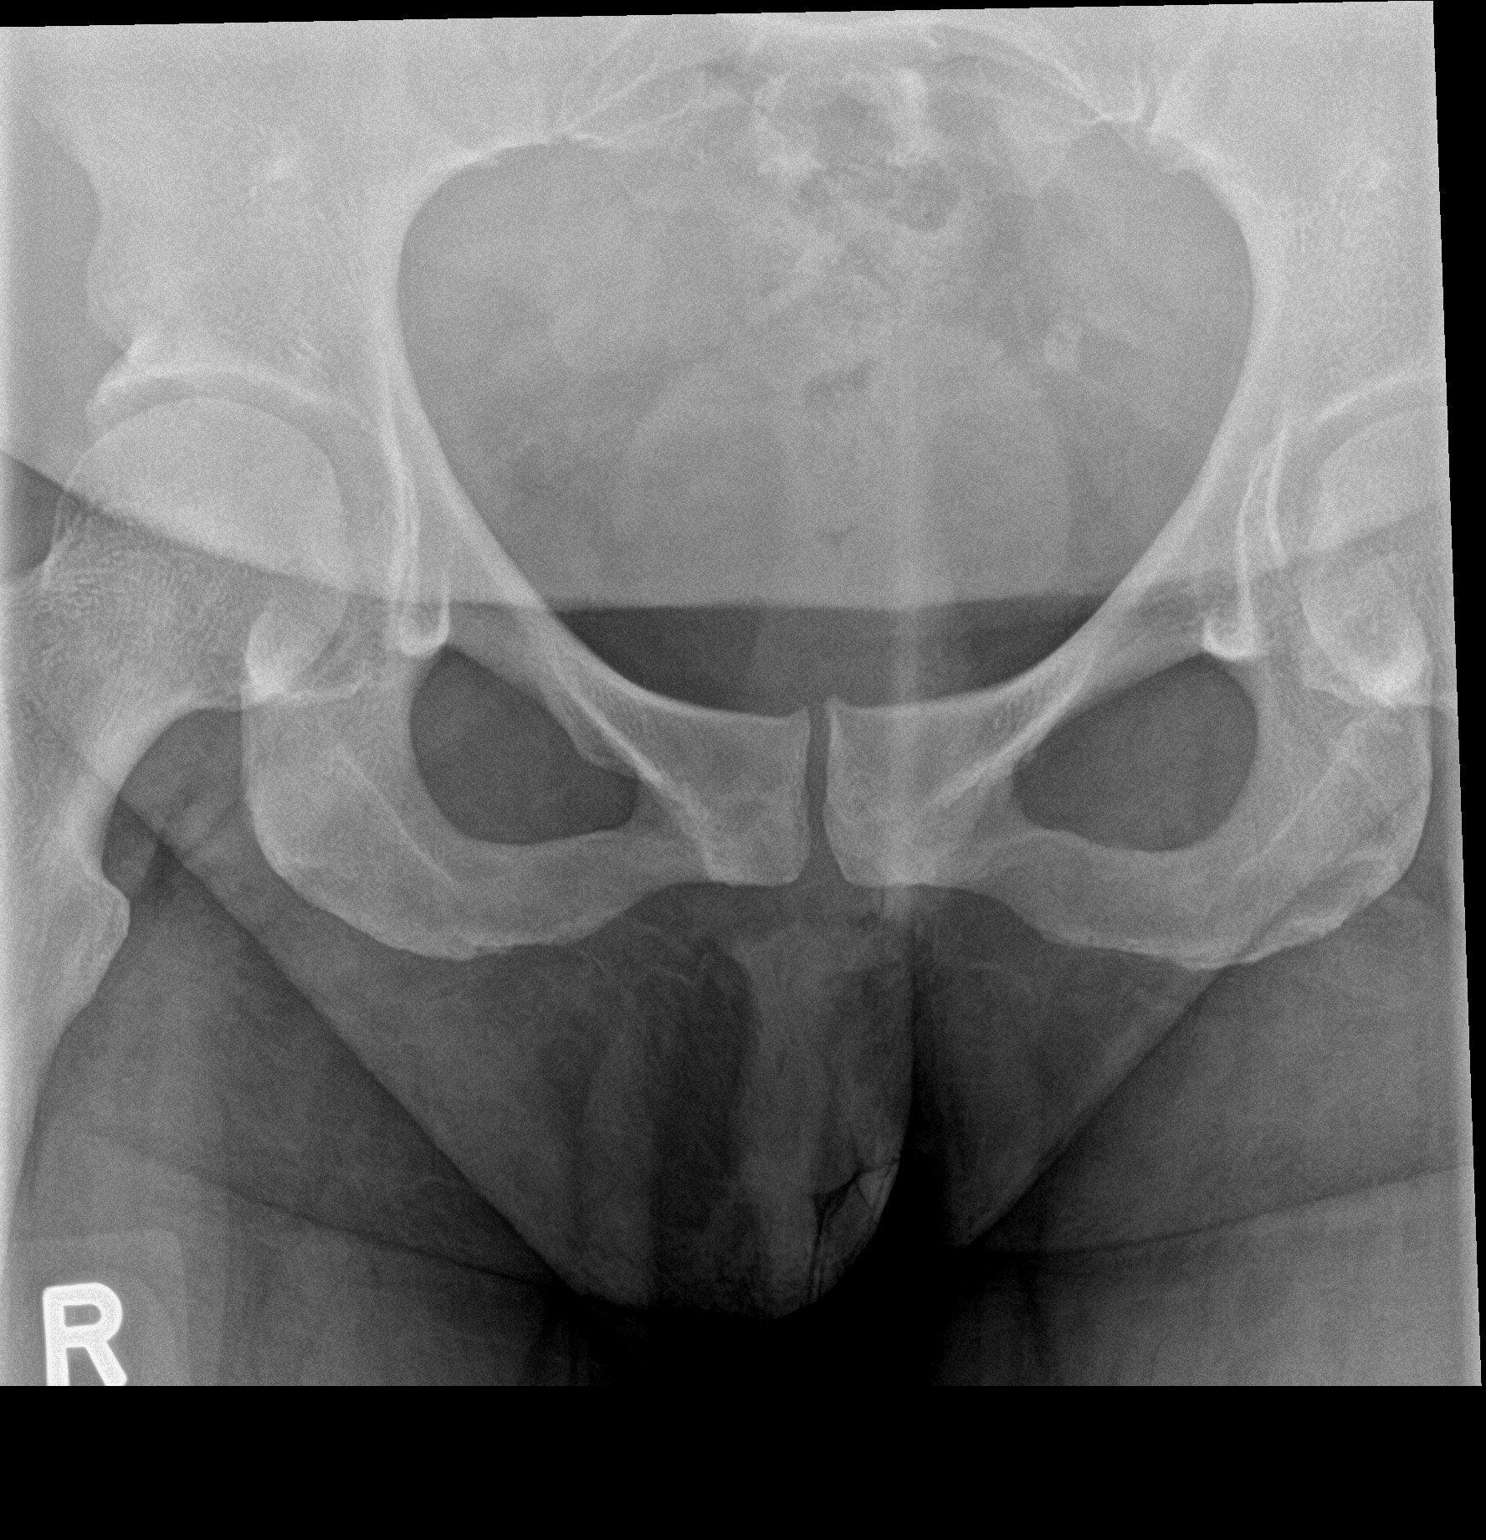

[sacrum ap]
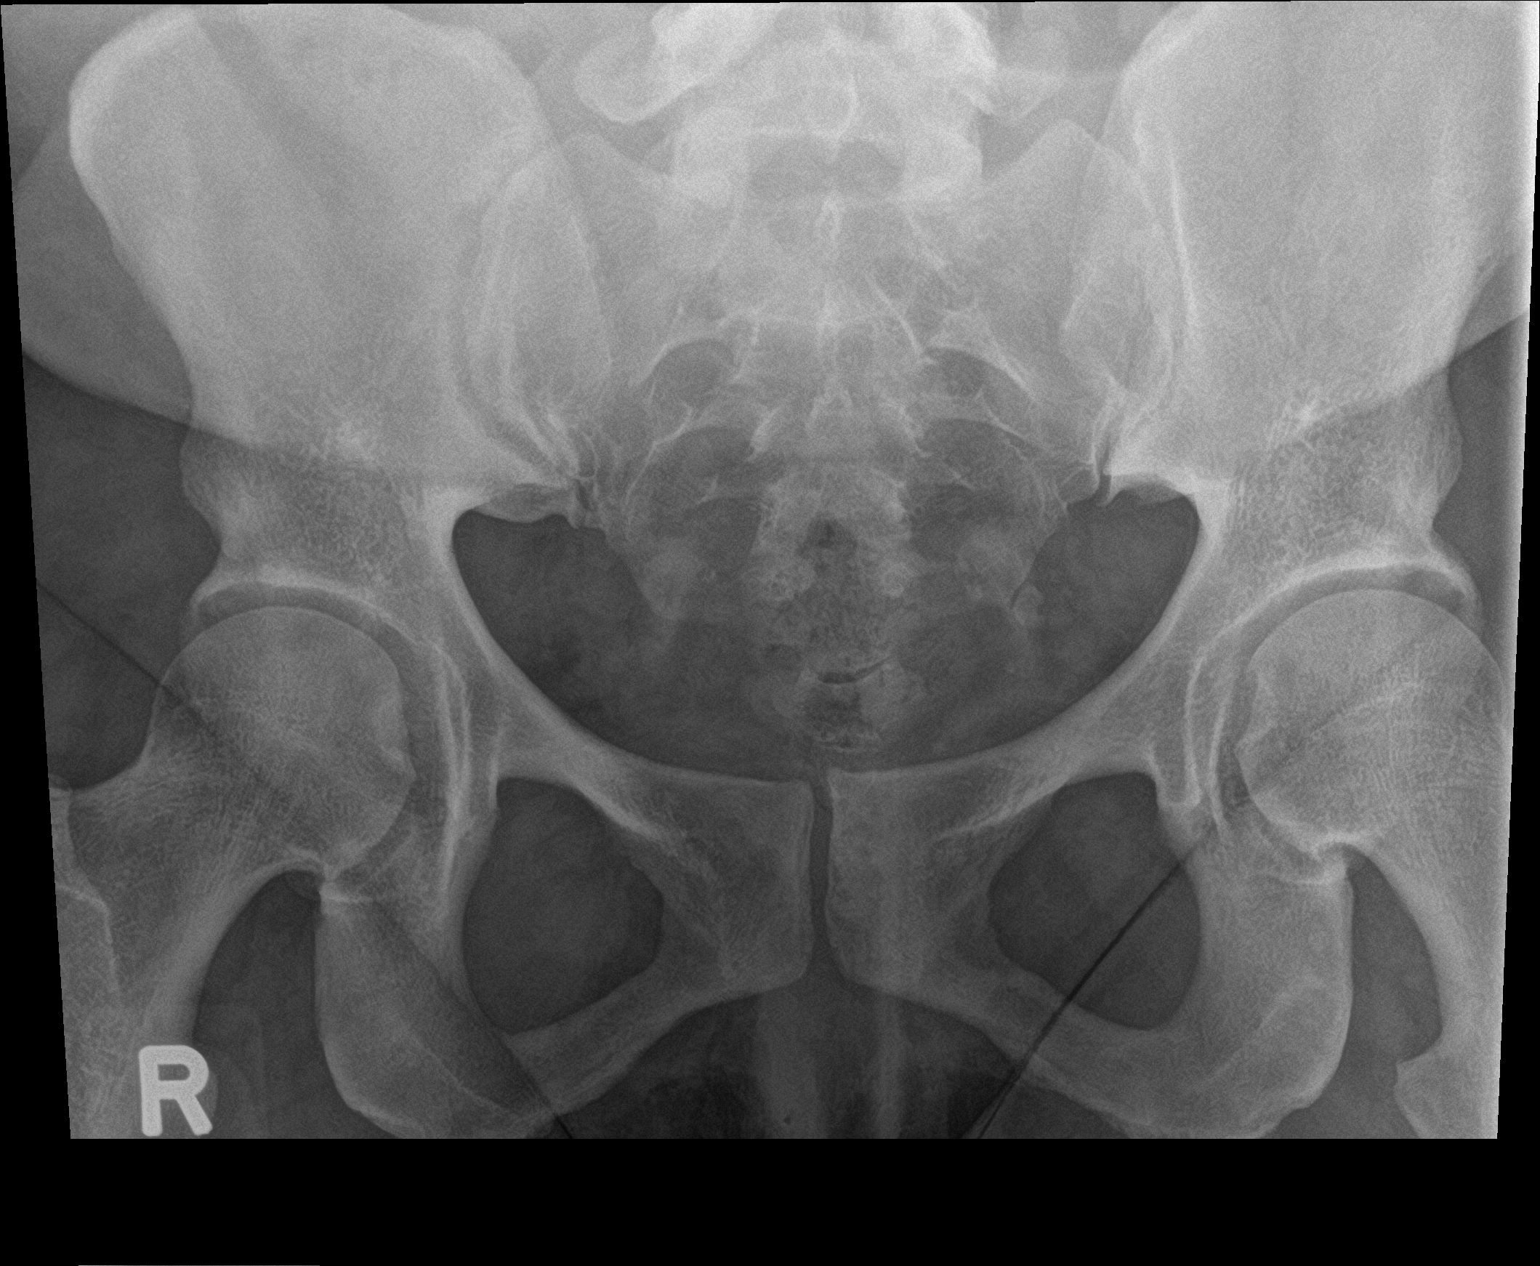

[sacrum lat]
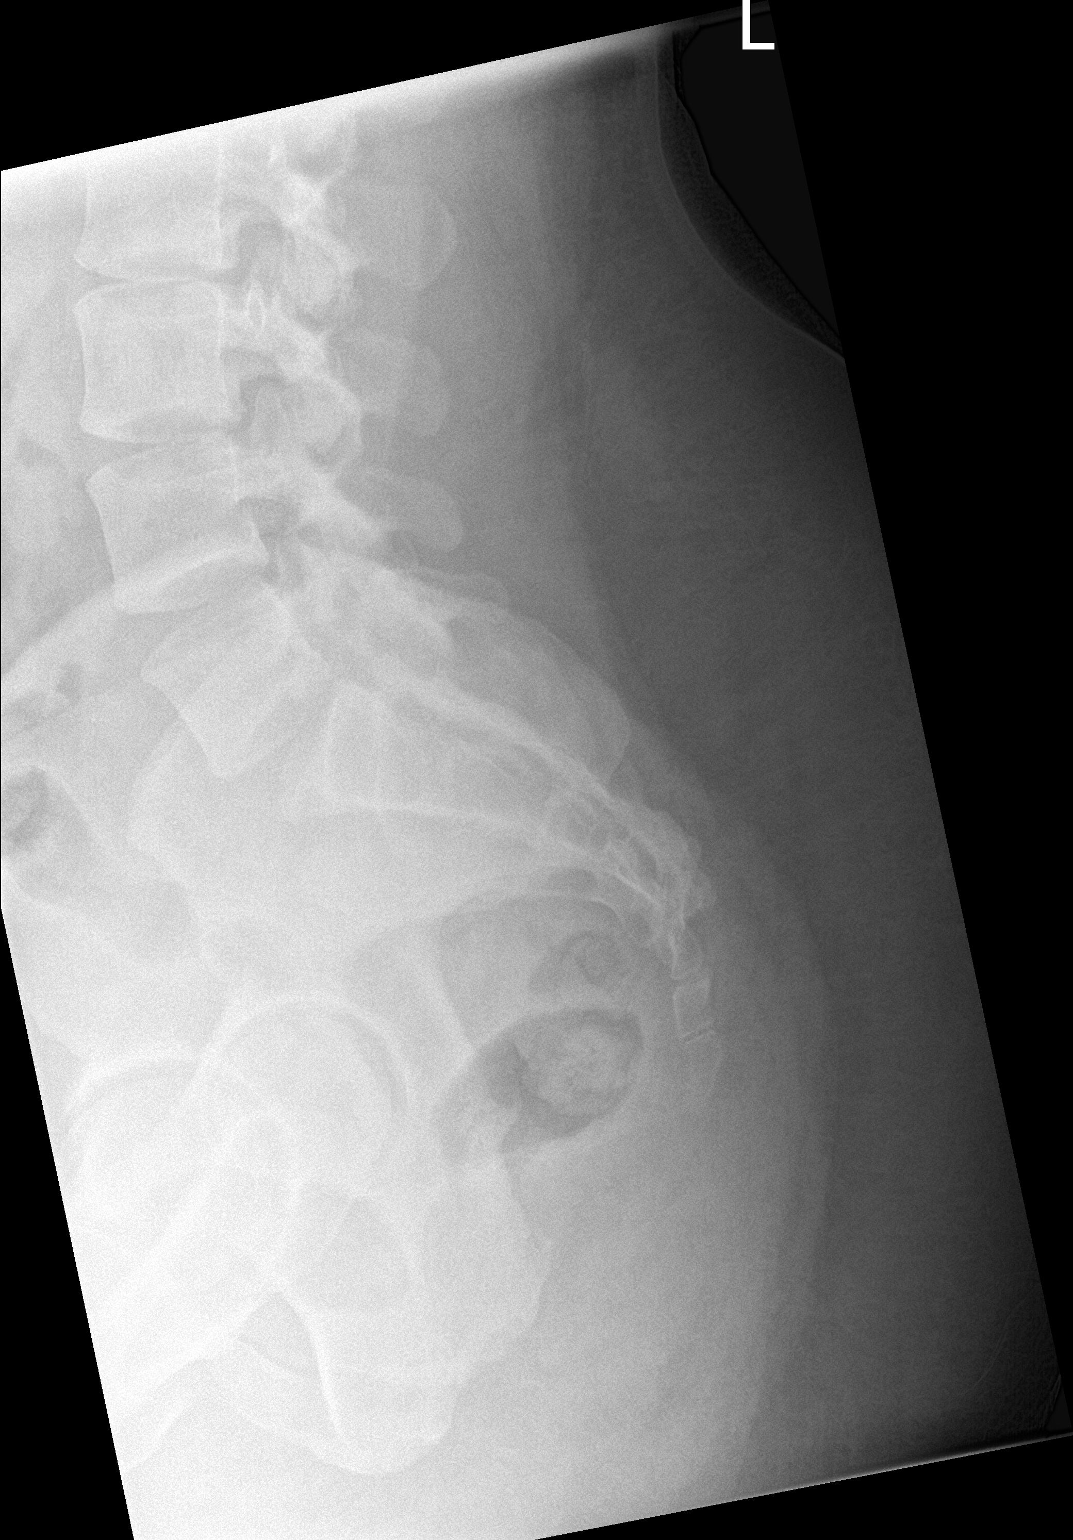

[3 of 3 positions shown; findings below may reference images not displayed]

FINDINGS: There is no evidence of fracture or other focal bone lesions.
IMPRESSION: Normal sacrum and coccyx.

## 2018-10-19 ENCOUNTER — Ambulatory Visit: Payer: Medicaid Other | Admitting: Neurology

## 2018-10-24 ENCOUNTER — Ambulatory Visit: Payer: Medicaid Other | Admitting: Neurology

## 2018-10-29 ENCOUNTER — Other Ambulatory Visit: Payer: Self-pay | Admitting: *Deleted

## 2018-10-29 ENCOUNTER — Encounter: Payer: Self-pay | Admitting: *Deleted

## 2018-10-30 ENCOUNTER — Other Ambulatory Visit: Payer: Self-pay

## 2018-10-30 ENCOUNTER — Encounter: Payer: Self-pay | Admitting: Diagnostic Neuroimaging

## 2018-10-30 ENCOUNTER — Ambulatory Visit: Payer: Medicaid Other | Admitting: Diagnostic Neuroimaging

## 2018-10-30 VITALS — BP 140/85 | HR 86 | Temp 98.2°F | Ht 62.0 in | Wt 263.4 lb

## 2018-10-30 DIAGNOSIS — G43109 Migraine with aura, not intractable, without status migrainosus: Secondary | ICD-10-CM | POA: Diagnosis not present

## 2018-10-30 MED ORDER — RIZATRIPTAN BENZOATE 10 MG PO TBDP: 10.0000 mg | ORAL_TABLET | ORAL | 11 refills | Status: DC | PRN

## 2018-10-30 MED ORDER — TOPIRAMATE 50 MG PO TABS: 50.0000 mg | ORAL_TABLET | Freq: Two times a day (BID) | ORAL | 12 refills | Status: DC

## 2018-10-30 NOTE — Patient Instructions (Signed)
MIGRAINE WITH AURA - MIGRAINE PREVENTION --> start topiramate 50mg at bedtime; after 1 week increase to twice a day; drink plenty of water  - MIGRAINE RESCUE --> rizatriptan 10mg as needed for breakthrough headache; may repeat x 1 after 2 hours; max 2 tabs per day or 8 per month 

## 2018-10-30 NOTE — Progress Notes (Signed)
[facility]  PATIENT: Angela Doyle DOB: 07/03/1985  REFERRING CLINICIAN: Carmel Sacramentoyler Terry, NP HISTORY FROM: patient  REASON FOR VISIT: new consult    HISTORICAL  CHIEF COMPLAINT:  Chief Complaint  Patient presents with  . Migraine    rm 7 New Pt, "migraines and cluster headaches for several years, failed amitriptyline, sumatriptan"    HISTORY OF PRESENT ILLNESS:   33 year old female here for evaluation of headaches.  Patient has history of headaches since age 33 years old with global throbbing headaches, nausea and photophobia.  At age 33 years old patient had different type of headache with right-sided severe pain, right eye pain, photophobia, phonophobia, neck pain and throbbing sensation.  Since that time patient continues to have intermittent headaches.  She has approximately 1 month of headaches per year.  During this month she can have up to 2 headaches per day.  Headache can last hours or days at a time.  Sometimes patient feels blurred vision or visual disturbance prior to headaches.  Patient has tried amitriptyline and sumatriptan without relief.  Patient also has history of chronic pain syndrome, fibromyalgia, "neuropathy" nerve root disorder, currently on chronic pain management with oxycodone tizanidine duloxetine Lyrica.   REVIEW OF SYSTEMS: Full 14 system review of systems performed and negative with exception of: As per HPI.  ALLERGIES: Allergies  Allergen Reactions  . Pineapple Swelling    Oral swelling    HOME MEDICATIONS: Outpatient Medications Prior to Visit  Medication Sig Dispense Refill  . allopurinol (ZYLOPRIM) 100 MG tablet TK 1 T PO D    . butalbital-acetaminophen-caffeine (FIORICET) 50-325-40 MG tablet TK 1-2 CAPLETS PO EVERY 6 HOURS PRN FOR HEADACHES    . DULoxetine (CYMBALTA) 60 MG capsule TK ONE C PO D    . meloxicam (MOBIC) 15 MG tablet TK 1/2 T PO BID PRN. MDD 15 MG    . methocarbamol (ROBAXIN) 750 MG tablet TK 1 T PO Q 12  H PRN    . oxyCODONE-acetaminophen (PERCOCET) 10-325 MG tablet TK 1 T PO TID    . tiZANidine (ZANAFLEX) 4 MG tablet TK 1 T PO TID PRN    . traZODone (DESYREL) 50 MG tablet as needed.    Marland Kitchen. XTAMPZA ER 9 MG C12A TK 1 C PO BID    . pregabalin (LYRICA) 200 MG capsule Take 1 capsule (200 mg total) by mouth 2 (two) times daily for 30 days. 60 capsule 0  . amitriptyline (ELAVIL) 10 MG tablet Take 1 tablet (10 mg total) by mouth at bedtime for 1 day. 1 tablet 0  . furosemide (LASIX) 20 MG tablet Take 1 tablet (20 mg total) by mouth daily. 30 tablet 2  . ibuprofen (ADVIL,MOTRIN) 600 MG tablet Take one tablet in AM day before ovulation is to start; repeat daily x 6 days. (Patient not taking: Reported on 03/12/2018) 30 tablet 0   No facility-administered medications prior to visit.     PAST MEDICAL HISTORY: Past Medical History:  Diagnosis Date  . Chronic back pain   . Depression   . Fibromyalgia   . Headache    migraine  . Hypercholesterolemia   . Neuropathy   . Osteoarthritis   . Pseudoseizures   . Seizure (HCC)    pseudo seizures  . TIA (transient ischemic attack)   . Vertigo     PAST SURGICAL HISTORY: Past Surgical History:  Procedure Laterality Date  . CESAREAN SECTION  2011  . OVARIAN CYST SURGERY  FAMILY HISTORY: Family History  Problem Relation Age of Onset  . Neuropathy Mother   . Arthritis Mother   . Hypertension Mother   . Heart disease Maternal Grandmother   . Diabetes Maternal Grandmother   . Breast cancer Maternal Aunt     SOCIAL HISTORY: Social History   Socioeconomic History  . Marital status: Single    Spouse name: Not on file  . Number of children: 1  . Years of education: Not on file  . Highest education level: High school graduate  Occupational History  . Occupation: Un employed  Social Needs  . Financial resource strain: Not on file  . Food insecurity    Worry: Not on file    Inability: Not on file  . Transportation needs    Medical: Not  on file    Non-medical: Not on file  Tobacco Use  . Smoking status: Former Smoker    Packs/day: 0.25    Types: Cigarettes  . Smokeless tobacco: Never Used  . Tobacco comment: 4-5 per day   Substance and Sexual Activity  . Alcohol use: Yes    Alcohol/week: 0.0 standard drinks    Comment: occ  . Drug use: No  . Sexual activity: Yes    Partners: Male    Birth control/protection: None  Lifestyle  . Physical activity    Days per week: Not on file    Minutes per session: Not on file  . Stress: Not on file  Relationships  . Social Herbalist on phone: Not on file    Gets together: Not on file    Attends religious service: Not on file    Active member of club or organization: Not on file    Attends meetings of clubs or organizations: Not on file    Relationship status: Not on file  . Intimate partner violence    Fear of current or ex partner: Not on file    Emotionally abused: Not on file    Physically abused: Not on file    Forced sexual activity: Not on file  Other Topics Concern  . Not on file  Social History Narrative   Lives with child   Caffeine- coffee 1 cup     PHYSICAL EXAM  GENERAL EXAM/CONSTITUTIONAL: Vitals:  Vitals:   10/30/18 1245  BP: 140/85  Pulse: 86  Temp: 98.2 F (36.8 C)  Weight: 263 lb 6.4 oz (119.5 kg)  Height: 5\' 2"  (1.575 m)     Body mass index is 48.18 kg/m. Wt Readings from Last 3 Encounters:  10/30/18 263 lb 6.4 oz (119.5 kg)  04/23/18 240 lb (108.9 kg)  03/27/18 253 lb (114.8 kg)     Patient is in no distress; well developed, nourished and groomed; neck is supple  CARDIOVASCULAR:  Examination of carotid arteries is normal; no carotid bruits  Regular rate and rhythm, no murmurs  Examination of peripheral vascular system by observation and palpation is normal  EYES:  Ophthalmoscopic exam of optic discs and posterior segments is normal; no papilledema or hemorrhages  No exam data present  MUSCULOSKELETAL:   Gait, strength, tone, movements noted in Neurologic exam below  NEUROLOGIC: MENTAL STATUS:  No flowsheet data found.  awake, alert, oriented to person, place and time  recent and remote memory intact  normal attention and concentration  language fluent, comprehension intact, naming intact  fund of knowledge appropriate  CRANIAL NERVE:   2nd - no papilledema on fundoscopic exam  2nd, 3rd,  4th, 6th - pupils equal and reactive to light, visual fields full to confrontation, extraocular muscles intact, no nystagmus  5th - facial sensation symmetric  7th - facial strength symmetric  8th - hearing intact  9th - palate elevates symmetrically, uvula midline  11th - shoulder shrug symmetric  12th - tongue protrusion midline  MOTOR:   normal bulk and tone, full strength in the BUE, BLE  SENSORY:   normal and symmetric to light touch, temperature, vibration  COORDINATION:   finger-nose-finger, fine finger movements normal  REFLEXES:   deep tendon reflexes TRACE and symmetric  GAIT/STATION:   narrow based gait     DIAGNOSTIC DATA (LABS, IMAGING, TESTING) - I reviewed patient records, labs, notes, testing and imaging myself where available.  Lab Results  Component Value Date   WBC 6.1 09/27/2017   HGB 14.1 09/27/2017   HCT 42.6 09/27/2017   MCV 92 09/27/2017   PLT 211 09/27/2017      Component Value Date/Time   NA 141 09/27/2017 1402   K 4.3 09/27/2017 1402   CL 105 09/27/2017 1402   CO2 22 09/27/2017 1402   GLUCOSE 85 09/27/2017 1402   GLUCOSE 97 03/29/2016 1041   BUN 15 09/27/2017 1402   CREATININE 1.03 (H) 09/27/2017 1402   CREATININE 0.96 03/29/2016 1041   CALCIUM 9.1 09/27/2017 1402   PROT 6.8 06/27/2016 1706   ALBUMIN 4.3 06/27/2016 1706   AST 21 06/27/2016 1706   ALT 32 06/27/2016 1706   ALKPHOS 71 06/27/2016 1706   BILITOT <0.2 06/27/2016 1706   GFRNONAA 74 09/27/2017 1402   GFRAA 85 09/27/2017 1402   Lab Results  Component Value  Date   CHOL 183 05/28/2015   HDL 44 (L) 05/28/2015   LDLCALC 118 05/28/2015   TRIG 104 05/28/2015   CHOLHDL 4.2 05/28/2015   Lab Results  Component Value Date   HGBA1C 5.4 06/27/2016   Lab Results  Component Value Date   VITAMINB12 770 07/23/2015   Lab Results  Component Value Date   TSH 1.870 06/27/2016    07/21/14 MRI brain [report only; [facility]] - normal  08/11/15 EEG - normal  01/28/16 NCV/EMG 1. Left ulnar neuropathy with slowing across the elbow, purely demyelinating in type. 2. There is no evidence of a diffuse myopathy, cervical radiculopathy, or carpal tunnel syndrome affecting the upper extremities.   ASSESSMENT AND PLAN  33 y.o. year old female here with:  Dx:  1. Migraine with aura and without status migrainosus, not intractable      PLAN:  MIGRAINE WITH AURA - MIGRAINE PREVENTION --> start topiramate 50mg  at bedtime; after 1 week increase to twice a day; drink plenty of water - MIGRAINE RESCUE --> start rizatriptan 10mg  as needed for breakthrough headache; may repeat x 1 after 2 hours; max 2 tabs per day or 8 per month  CHRONIC PAIN (fibromyalgia, neuropathic pain, degenerative spine) - per [facility] (lyrica, duloxetine, oxycodone, tizanidine, meloxicam)  NON-EPILEPTIC SPELLS - diagnosed by [facility] in 2017; no spells since 2018; monitor  Meds ordered this encounter  Medications  . topiramate (TOPAMAX) 50 MG tablet    Sig: Take 1 tablet (50 mg total) by mouth 2 (two) times daily.    Dispense:  60 tablet    Refill:  12  . rizatriptan (MAXALT-MLT) 10 MG disintegrating tablet    Sig: Take 1 tablet (10 mg total) by mouth as needed for migraine. May repeat in 2 hours if needed    Dispense:  9 tablet    Refill:  11   Return in about 6 months (around 04/29/2019) for with NP (Amy Lomax).    Suanne Marker, MD 10/30/2018, 1:16 PM Certified in Neurology, Neurophysiology and Neuroimaging  [facility] [address] [place], [place] [address] [phone]

## 2018-11-07 ENCOUNTER — Encounter (HOSPITAL_COMMUNITY): Payer: Self-pay

## 2018-11-07 ENCOUNTER — Encounter (HOSPITAL_COMMUNITY): Payer: Self-pay | Admitting: *Deleted

## 2018-12-17 ENCOUNTER — Other Ambulatory Visit (HOSPITAL_COMMUNITY)
Admission: RE | Admit: 2018-12-17 | Discharge: 2018-12-17 | Disposition: A | Discharge status: Home / Self Care | Payer: Medicaid Other | Source: Ambulatory Visit | Attending: Obstetrics | Admitting: Obstetrics

## 2018-12-17 ENCOUNTER — Encounter: Payer: Self-pay | Admitting: Obstetrics

## 2018-12-17 ENCOUNTER — Other Ambulatory Visit: Payer: Self-pay

## 2018-12-17 ENCOUNTER — Ambulatory Visit: Payer: Medicaid Other | Admitting: Obstetrics

## 2018-12-17 VITALS — BP 122/82 | HR 73 | Ht 62.0 in | Wt 246.0 lb

## 2018-12-17 DIAGNOSIS — N898 Other specified noninflammatory disorders of vagina: Secondary | ICD-10-CM

## 2018-12-17 DIAGNOSIS — N946 Dysmenorrhea, unspecified: Secondary | ICD-10-CM

## 2018-12-17 DIAGNOSIS — R102 Pelvic and perineal pain: Secondary | ICD-10-CM

## 2018-12-17 DIAGNOSIS — Z0000 Encounter for general adult medical examination without abnormal findings: Secondary | ICD-10-CM

## 2018-12-17 DIAGNOSIS — Z113 Encounter for screening for infections with a predominantly sexual mode of transmission: Secondary | ICD-10-CM

## 2018-12-17 DIAGNOSIS — Z01419 Encounter for gynecological examination (general) (routine) without abnormal findings: Secondary | ICD-10-CM | POA: Insufficient documentation

## 2018-12-17 DIAGNOSIS — Z30011 Encounter for initial prescription of contraceptive pills: Secondary | ICD-10-CM

## 2018-12-17 DIAGNOSIS — G8929 Other chronic pain: Secondary | ICD-10-CM

## 2018-12-17 DIAGNOSIS — Z3009 Encounter for other general counseling and advice on contraception: Secondary | ICD-10-CM

## 2018-12-17 MED ORDER — BALCOLTRA 0.1-20 MG-MCG(21) PO TABS: 1.0000 | ORAL_TABLET | Freq: Every day | ORAL | 11 refills | Status: DC

## 2018-12-17 MED ORDER — IBUPROFEN 800 MG PO TABS: 800.0000 mg | ORAL_TABLET | Freq: Three times a day (TID) | ORAL | 5 refills | Status: DC | PRN

## 2018-12-17 MED ORDER — PRENATE MINI 29-0.6-0.4-350 MG PO CAPS: 1.0000 | ORAL_CAPSULE | Freq: Every day | ORAL | 11 refills | Status: DC

## 2018-12-17 NOTE — Progress Notes (Signed)
Subjective:        Angela Doyle is a 33 y.o. female here for a routine exam.  Current complaints: None.    Personal health questionnaire:  Is patient Angela Doyle, have a family history of breast and/or ovarian cancer: yes Is there a family history of uterine cancer diagnosed at age < 37, gastrointestinal cancer, urinary tract cancer, family member who is a Personnel officer syndrome-associated carrier: no Is the patient overweight and hypertensive, family history of diabetes, personal history of gestational diabetes, preeclampsia or PCOS: no Is patient over 56, have PCOS,  family history of premature CHD under age 35, diabetes, smoke, have hypertension or peripheral artery disease:  no At any time, has a partner hit, kicked or otherwise hurt or frightened you?: no Over the past 2 weeks, have you felt down, depressed or hopeless?: no Over the past 2 weeks, have you felt little interest or pleasure in doing things?:no   Gynecologic History Patient's last menstrual period was 12/06/2018. Contraception: none Last Pap: 12-12-2016. Results were: normal Last mammogram: n/a. Results were: n/a  Obstetric History OB History  Gravida Para Term Preterm AB Living  2 1 1   1 1   SAB TAB Ectopic Multiple Live Births  1       1    # Outcome Date GA Lbr Len/2nd Weight Sex Delivery Anes PTL Lv  2 Term 03/13/09    M CS-LTranv   LIV  1 SAB  [redacted]w[redacted]d           Past Medical History:  Diagnosis Date  . Chronic back pain   . Depression   . Fibromyalgia   . Headache    migraine  . Hypercholesterolemia   . Neuropathy   . Osteoarthritis   . Pseudoseizures   . Seizure (HCC)    pseudo seizures  . TIA (transient ischemic attack)   . Vertigo     Past Surgical History:  Procedure Laterality Date  . CESAREAN SECTION  2011  . OVARIAN CYST SURGERY       Current Outpatient Medications:  .  allopurinol (ZYLOPRIM) 100 MG tablet, TK 1 T PO D, Disp: , Rfl:  .  meloxicam (MOBIC) 15 MG tablet, TK 1/2 T PO  BID PRN. MDD 15 MG, Disp: , Rfl:  .  methocarbamol (ROBAXIN) 750 MG tablet, TK 1 T PO Q 12 H PRN, Disp: , Rfl:  .  oxyCODONE-acetaminophen (PERCOCET) 10-325 MG tablet, TK 1 T PO TID, Disp: , Rfl:  .  pregabalin (LYRICA) 200 MG capsule, Take 1 capsule (200 mg total) by mouth 2 (two) times daily for 30 days., Disp: 60 capsule, Rfl: 0 .  rizatriptan (MAXALT-MLT) 10 MG disintegrating tablet, Take 1 tablet (10 mg total) by mouth as needed for migraine. May repeat in 2 hours if needed, Disp: 9 tablet, Rfl: 11 .  tiZANidine (ZANAFLEX) 4 MG tablet, TK 1 T PO TID PRN, Disp: , Rfl:  .  topiramate (TOPAMAX) 50 MG tablet, Take 1 tablet (50 mg total) by mouth 2 (two) times daily., Disp: 60 tablet, Rfl: 12 .  XTAMPZA ER 9 MG C12A, TK 1 C PO BID, Disp: , Rfl:  .  butalbital-acetaminophen-caffeine (FIORICET) 50-325-40 MG tablet, TK 1-2 CAPLETS PO EVERY 6 HOURS PRN FOR HEADACHES, Disp: , Rfl:  .  DULoxetine (CYMBALTA) 60 MG capsule, TK ONE C PO D, Disp: , Rfl:  .  ibuprofen (ADVIL) 800 MG tablet, Take 1 tablet (800 mg total) by mouth every 8 (eight) hours  as needed., Disp: 30 tablet, Rfl: 5 .  Levonorgest-Eth Estrad-Fe Bisg (BALCOLTRA) 0.1-20 MG-MCG(21) TABS, Take 1 tablet by mouth daily., Disp: 28 tablet, Rfl: 11 .  Prenat w/o A-FeCbn-Meth-FA-DHA (PRENATE MINI) 29-0.6-0.4-350 MG CAPS, Take 1 capsule by mouth daily before breakfast., Disp: 30 capsule, Rfl: 11 .  traZODone (DESYREL) 50 MG tablet, as needed., Disp: , Rfl:  Allergies  Allergen Reactions  . Pineapple Swelling    Oral swelling    Social History   Tobacco Use  . Smoking status: Light Tobacco Smoker    Packs/day: 0.25    Types: Cigarettes  . Smokeless tobacco: Never Used  . Tobacco comment: 4-5 per day   Substance Use Topics  . Alcohol use: Yes    Alcohol/week: 0.0 standard drinks    Comment: occ    Family History  Problem Relation Age of Onset  . Neuropathy Mother   . Arthritis Mother   . Hypertension Mother   . Heart disease Maternal  Grandmother   . Diabetes Maternal Grandmother   . Breast cancer Maternal Aunt       Review of Systems  Constitutional: negative for fatigue and weight loss Respiratory: negative for cough and wheezing Cardiovascular: negative for chest pain, fatigue and palpitations Gastrointestinal: negative for abdominal pain and change in bowel habits Musculoskeletal:negative for myalgias Neurological: negative for gait problems and tremors Behavioral/Psych: negative for abusive relationship, depression Endocrine: negative for temperature intolerance    Genitourinary:negative for abnormal menstrual periods, genital lesions, hot flashes, sexual problems and vaginal discharge Integument/breast: negative for breast lump, breast tenderness, nipple discharge and skin lesion(s)    Objective:       BP 122/82   Pulse 73   Ht 5\' 2"  (1.575 m)   Wt 246 lb (111.6 kg)   LMP 12/06/2018   BMI 44.99 kg/m  General:   alert  Skin:   no rash or abnormalities  Lungs:   clear to auscultation bilaterally  Heart:   regular rate and rhythm, S1, S2 normal, no murmur, click, rub or gallop  Breasts:   normal without suspicious masses, skin or nipple changes or axillary nodes  Abdomen:  normal findings: no organomegaly, soft, non-tender and no hernia  Pelvis:  External genitalia: normal general appearance Urinary system: urethral meatus normal and bladder without fullness, nontender Vaginal: normal without tenderness, induration or masses Cervix: normal appearance Adnexa: normal bimanual exam Uterus: anteverted and non-tender, normal size   Lab Review Urine pregnancy test Labs reviewed yes Radiologic studies reviewed no  50% of 25 min visit spent on counseling and coordination of care.   Assessment:    Healthy female exam.    Plan:    Education reviewed: calcium supplements, depression evaluation, low fat, low cholesterol diet, safe sex/STD prevention, self breast exams, skin cancer screening, smoking  cessation and weight bearing exercise. Contraception: OCP (estrogen/progesterone). Follow up in: 1 year.   Meds ordered this encounter  Medications  . ibuprofen (ADVIL) 800 MG tablet    Sig: Take 1 tablet (800 mg total) by mouth every 8 (eight) hours as needed.    Dispense:  30 tablet    Refill:  5  . Levonorgest-Eth Estrad-Fe Bisg (BALCOLTRA) 0.1-20 MG-MCG(21) TABS    Sig: Take 1 tablet by mouth daily.    Dispense:  28 tablet    Refill:  11  . Prenat w/o A-FeCbn-Meth-FA-DHA (PRENATE MINI) 29-0.6-0.4-350 MG CAPS    Sig: Take 1 capsule by mouth daily before breakfast.    Dispense:  30  capsule    Refill:  11   Orders Placed This Encounter  Procedures  . HIV Antibody (routine testing w rflx)  . Hepatitis B surface antigen  . RPR  . Hepatitis C antibody    Shelly Bombard, MD 12/17/2018 10:03 AM

## 2018-12-18 LAB — HIV ANTIBODY (ROUTINE TESTING W REFLEX): HIV Screen 4th Generation wRfx: NONREACTIVE

## 2018-12-18 LAB — HEPATITIS B SURFACE ANTIGEN: Hepatitis B Surface Ag: NEGATIVE

## 2018-12-18 LAB — HEPATITIS C ANTIBODY: Hep C Virus Ab: <0.1 s/co ratio (ref 0.0–0.9)

## 2018-12-18 LAB — RPR: RPR Ser Ql: NONREACTIVE

## 2018-12-20 ENCOUNTER — Other Ambulatory Visit: Payer: Self-pay | Admitting: Obstetrics

## 2018-12-20 DIAGNOSIS — B9689 Other specified bacterial agents as the cause of diseases classified elsewhere: Secondary | ICD-10-CM

## 2018-12-20 DIAGNOSIS — A599 Trichomoniasis, unspecified: Secondary | ICD-10-CM

## 2018-12-20 DIAGNOSIS — N760 Acute vaginitis: Secondary | ICD-10-CM

## 2018-12-20 LAB — CERVICOVAGINAL ANCILLARY ONLY
Bacterial Vaginitis (gardnerella): POSITIVE — AB
Candida Glabrata: NEGATIVE
Candida Vaginitis: NEGATIVE
Chlamydia: NEGATIVE
Comment: NEGATIVE
Comment: NEGATIVE
Comment: NEGATIVE
Comment: NEGATIVE
Comment: NEGATIVE
Comment: NORMAL
Neisseria Gonorrhea: NEGATIVE
Trichomonas: POSITIVE — AB

## 2018-12-20 MED ORDER — METRONIDAZOLE 500 MG PO TABS: 2000.0000 mg | ORAL_TABLET | Freq: Once | ORAL | 0 refills | Status: AC

## 2018-12-20 MED ORDER — METRONIDAZOLE 500 MG PO TABS: 500.0000 mg | ORAL_TABLET | Freq: Two times a day (BID) | ORAL | 2 refills | Status: DC

## 2018-12-21 LAB — CYTOLOGY - PAP
Adequacy: ABSENT
Comment: NEGATIVE
Comment: NEGATIVE
Comment: NEGATIVE
Diagnosis: NEGATIVE
HPV 16: NEGATIVE
HPV 18 / 45: NEGATIVE
High risk HPV: POSITIVE — AB

## 2019-05-01 ENCOUNTER — Encounter: Payer: Self-pay | Admitting: Family Medicine

## 2019-05-01 ENCOUNTER — Ambulatory Visit: Payer: Medicaid Other | Admitting: Family Medicine

## 2019-05-01 NOTE — Progress Notes (Deleted)
PATIENT: Angela Doyle DOB: [birth year]  REASON FOR VISIT: follow up HISTORY FROM: patient  No chief complaint on file.    HISTORY OF PRESENT ILLNESS: Today 05/01/19 Angela Doyle is a 34 y.o. female here today for follow up for migraines with aura.    HISTORY: (copied from Dr Richrd Humbles note on 10/30/2018)  34 year old female here for evaluation of headaches.  Patient has history of headaches since age 85 years old with global throbbing headaches, nausea and photophobia.  At age 77 years old patient had different type of headache with right-sided severe pain, right eye pain, photophobia, phonophobia, neck pain and throbbing sensation.  Since that time patient continues to have intermittent headaches.  She has approximately 1 month of headaches per year.  During this month she can have up to 2 headaches per day.  Headache can last hours or days at a time.  Sometimes patient feels blurred vision or visual disturbance prior to headaches.  Patient has tried amitriptyline and sumatriptan without relief.  Patient also has history of chronic pain syndrome, fibromyalgia, "neuropathy" nerve root disorder, currently on chronic pain management with oxycodone tizanidine duloxetine Lyrica.   REVIEW OF SYSTEMS: Out of a complete 14 system review of symptoms, the patient complains only of the following symptoms, and all other reviewed systems are negative.  ALLERGIES: Allergies  Allergen Reactions  . Pineapple Swelling    Oral swelling    HOME MEDICATIONS: Outpatient Medications Prior to Visit  Medication Sig Dispense Refill  . allopurinol (ZYLOPRIM) 100 MG tablet TK 1 T PO D    . butalbital-acetaminophen-caffeine (FIORICET) 50-325-40 MG tablet TK 1-2 CAPLETS PO EVERY 6 HOURS PRN FOR HEADACHES    . DULoxetine (CYMBALTA) 60 MG capsule TK ONE C PO D    . ibuprofen (ADVIL) 800 MG tablet Take 1 tablet (800 mg total) by mouth every 8 (eight) hours as needed. 30 tablet 5  .  Levonorgest-Eth Estrad-Fe Bisg (BALCOLTRA) 0.1-20 MG-MCG(21) TABS Take 1 tablet by mouth daily. 28 tablet 11  . meloxicam (MOBIC) 15 MG tablet TK 1/2 T PO BID PRN. MDD 15 MG    . methocarbamol (ROBAXIN) 750 MG tablet TK 1 T PO Q 12 H PRN    . metroNIDAZOLE (FLAGYL) 500 MG tablet Take 1 tablet (500 mg total) by mouth 2 (two) times daily. 14 tablet 2  . oxyCODONE-acetaminophen (PERCOCET) 10-325 MG tablet TK 1 T PO TID    . pregabalin (LYRICA) 200 MG capsule Take 1 capsule (200 mg total) by mouth 2 (two) times daily for 30 days. 60 capsule 0  . Prenat w/o A-FeCbn-Meth-FA-DHA (PRENATE MINI) 29-0.6-0.4-350 MG CAPS Take 1 capsule by mouth daily before breakfast. 30 capsule 11  . rizatriptan (MAXALT-MLT) 10 MG disintegrating tablet Take 1 tablet (10 mg total) by mouth as needed for migraine. May repeat in 2 hours if needed 9 tablet 11  . tiZANidine (ZANAFLEX) 4 MG tablet TK 1 T PO TID PRN    . topiramate (TOPAMAX) 50 MG tablet Take 1 tablet (50 mg total) by mouth 2 (two) times daily. 60 tablet 12  . traZODone (DESYREL) 50 MG tablet as needed.    Marland Kitchen XTAMPZA ER 9 MG C12A TK 1 C PO BID     No facility-administered medications prior to visit.    PAST MEDICAL HISTORY: Past Medical History:  Diagnosis Date  . Chronic back pain   . Depression   . Fibromyalgia   . Headache    migraine  . Hypercholesterolemia   .  Neuropathy   . Osteoarthritis   . Pseudoseizures   . Seizure (HCC)    pseudo seizures  . TIA (transient ischemic attack)   . Vertigo     PAST SURGICAL HISTORY: Past Surgical History:  Procedure Laterality Date  . CESAREAN SECTION  2011  . OVARIAN CYST SURGERY      FAMILY HISTORY: Family History  Problem Relation Age of Onset  . Neuropathy Mother   . Arthritis Mother   . Hypertension Mother   . Heart disease Maternal Grandmother   . Diabetes Maternal Grandmother   . Breast cancer Maternal Aunt     SOCIAL HISTORY: Social History   Socioeconomic History  . Marital status:  Single    Spouse name: Not on file  . Number of children: 1  . Years of education: Not on file  . Highest education level: High school graduate  Occupational History  . Occupation: Un employed  Tobacco Use  . Smoking status: Light Tobacco Smoker    Packs/day: 0.25    Types: Cigarettes  . Smokeless tobacco: Never Used  . Tobacco comment: 4-5 per day   Substance and Sexual Activity  . Alcohol use: Yes    Alcohol/week: 0.0 standard drinks    Comment: occ  . Drug use: No  . Sexual activity: Yes    Partners: Male    Birth control/protection: None  Other Topics Concern  . Not on file  Social History Narrative   Lives with child   Caffeine- coffee 1 cup   Social Determinants of Health   Financial Resource Strain:   . Difficulty of Paying Living Expenses: Not on file  Food Insecurity:   . Worried About Programme researcher, broadcasting/film/video in the Last Year: Not on file  . Ran Out of Food in the Last Year: Not on file  Transportation Needs:   . Lack of Transportation (Medical): Not on file  . Lack of Transportation (Non-Medical): Not on file  Physical Activity:   . Days of Exercise per Week: Not on file  . Minutes of Exercise per Session: Not on file  Stress:   . Feeling of Stress : Not on file  Social Connections:   . Frequency of Communication with Friends and Family: Not on file  . Frequency of Social Gatherings with Friends and Family: Not on file  . Attends Religious Services: Not on file  . Active Member of Clubs or Organizations: Not on file  . Attends Banker Meetings: Not on file  . Marital Status: Not on file  Intimate Partner Violence:   . Fear of Current or Ex-Partner: Not on file  . Emotionally Abused: Not on file  . Physically Abused: Not on file  . Sexually Abused: Not on file      PHYSICAL EXAM  There were no vitals filed for this visit. There is no height or weight on file to calculate BMI.  Generalized: Well developed, in no acute distress    Cardiology: normal rate and rhythm, no murmur noted Neurological examination  Mentation: Alert oriented to time, place, history taking. Follows all commands speech and language fluent Cranial nerve II-XII: Pupils were equal round reactive to light. Extraocular movements were full, visual field were full on confrontational test. Facial sensation and strength were normal. Uvula tongue midline. Head turning and shoulder shrug  were normal and symmetric. Motor: The motor testing reveals 5 over 5 strength of all 4 extremities. Good symmetric motor tone is noted throughout.  Sensory: Sensory  testing is intact to soft touch on all 4 extremities. No evidence of extinction is noted.  Coordination: Cerebellar testing reveals good finger-nose-finger and heel-to-shin bilaterally.  Gait and station: Gait is normal. Tandem gait is normal. Romberg is negative. No drift is seen.  Reflexes: Deep tendon reflexes are symmetric and normal bilaterally.   DIAGNOSTIC DATA (LABS, IMAGING, TESTING) - I reviewed patient records, labs, notes, testing and imaging myself where available.  No flowsheet data found.   Lab Results  Component Value Date   WBC 6.1 09/27/2017   HGB 14.1 09/27/2017   HCT 42.6 09/27/2017   MCV 92 09/27/2017   PLT 211 09/27/2017      Component Value Date/Time   NA 141 09/27/2017 1402   K 4.3 09/27/2017 1402   CL 105 09/27/2017 1402   CO2 22 09/27/2017 1402   GLUCOSE 85 09/27/2017 1402   GLUCOSE 97 03/29/2016 1041   BUN 15 09/27/2017 1402   CREATININE 1.03 (H) 09/27/2017 1402   CREATININE 0.96 03/29/2016 1041   CALCIUM 9.1 09/27/2017 1402   PROT 6.8 06/27/2016 1706   ALBUMIN 4.3 06/27/2016 1706   AST 21 06/27/2016 1706   ALT 32 06/27/2016 1706   ALKPHOS 71 06/27/2016 1706   BILITOT <0.2 06/27/2016 1706   GFRNONAA 74 09/27/2017 1402   GFRAA 85 09/27/2017 1402   Lab Results  Component Value Date   CHOL 183 05/28/2015   HDL 44 (L) 05/28/2015   LDLCALC 118 05/28/2015   TRIG  104 05/28/2015   CHOLHDL 4.2 05/28/2015   Lab Results  Component Value Date   HGBA1C 5.4 06/27/2016   Lab Results  Component Value Date   VITAMINB12 770 07/23/2015   Lab Results  Component Value Date   TSH 1.870 06/27/2016       ASSESSMENT AND PLAN 34 y.o. year old female  has a past medical history of Chronic back pain, Depression, Fibromyalgia, Headache, Hypercholesterolemia, Neuropathy, Osteoarthritis, Pseudoseizures, Seizure ([place]), TIA (transient ischemic attack), and Vertigo. here with ***  No diagnosis found.     No orders of the defined types were placed in this encounter.    No orders of the defined types were placed in this encounter.     I spent 15 minutes with the patient. 50% of this time was spent counseling and educating patient on plan of care and medications.    Debbora Presto, FNP-C 05/01/2019, 7:54 AM [facility] [address], [place],  [address] [phone]

## 2019-11-28 ENCOUNTER — Other Ambulatory Visit: Payer: Self-pay | Admitting: Diagnostic Neuroimaging

## 2020-04-13 ENCOUNTER — Telehealth: Payer: Self-pay | Admitting: Diagnostic Neuroimaging

## 2020-04-13 NOTE — Telephone Encounter (Signed)
PT sent a scheduling request through Mychart. She is complaining of new symptoms and has not been seen since 10/2018. I called her and said she needs to get a new referral from PCP. She said she will do that.

## 2020-04-17 ENCOUNTER — Other Ambulatory Visit (HOSPITAL_COMMUNITY)
Admission: RE | Admit: 2020-04-17 | Discharge: 2020-04-17 | Disposition: A | Discharge status: Home / Self Care | Payer: Medicaid Other | Source: Ambulatory Visit | Attending: Obstetrics | Admitting: Obstetrics

## 2020-04-17 ENCOUNTER — Ambulatory Visit (INDEPENDENT_AMBULATORY_CARE_PROVIDER_SITE_OTHER): Payer: Medicaid Other | Admitting: Obstetrics

## 2020-04-17 ENCOUNTER — Encounter: Payer: Self-pay | Admitting: Obstetrics

## 2020-04-17 ENCOUNTER — Other Ambulatory Visit: Payer: Self-pay

## 2020-04-17 VITALS — BP 135/90 | HR 98 | Ht 62.0 in | Wt 264.0 lb

## 2020-04-17 DIAGNOSIS — N898 Other specified noninflammatory disorders of vagina: Secondary | ICD-10-CM | POA: Insufficient documentation

## 2020-04-17 DIAGNOSIS — Z01419 Encounter for gynecological examination (general) (routine) without abnormal findings: Secondary | ICD-10-CM | POA: Diagnosis not present

## 2020-04-17 DIAGNOSIS — R350 Frequency of micturition: Secondary | ICD-10-CM

## 2020-04-17 DIAGNOSIS — R102 Pelvic and perineal pain: Secondary | ICD-10-CM

## 2020-04-17 DIAGNOSIS — Z113 Encounter for screening for infections with a predominantly sexual mode of transmission: Secondary | ICD-10-CM

## 2020-04-17 LAB — POCT URINALYSIS DIPSTICK
Bilirubin, UA: NEGATIVE
Glucose, UA: NEGATIVE
Ketones, UA: NEGATIVE
Leukocytes, UA: NEGATIVE
Nitrite, UA: NEGATIVE
Protein, UA: NEGATIVE
Spec Grav, UA: >=1.03 — AB (ref 1.010–1.025)
Urobilinogen, UA: 0.2 E.U./dL
pH, UA: 6 (ref 5.0–8.0)

## 2020-04-17 NOTE — Progress Notes (Signed)
Subjective:        Angela Doyle is a 35 y.o. female here for a routine exam.  Current complaints: Sharp pains in pelvic and vaginal areas for the past week.  Also has vaginal discharge and urinary frequency.  Denies pain with intercourse..    Personal health questionnaire:  Is patient [name], have a family history of breast and/or ovarian cancer: yes Is there a family history of uterine cancer diagnosed at age < 40, gastrointestinal cancer, urinary tract cancer, family member who is a Personnel officer syndrome-associated carrier: no Is the patient overweight and hypertensive, family history of diabetes, personal history of gestational diabetes, preeclampsia or PCOS: yes Is patient over 48, have PCOS,  family history of premature CHD under age 51, diabetes, smoke, have hypertension or peripheral artery disease:  no At any time, has a partner hit, kicked or otherwise hurt or frightened you?: no Over the past 2 weeks, have you felt down, depressed or hopeless?: no Over the past 2 weeks, have you felt little interest or pleasure in doing things?:no   Gynecologic History Patient's last menstrual period was 03/31/2020. Contraception: none Last Pap: 2020. Results were: NILM with positive HRHPV Last mammogram: n/a. Results were: n/a  Obstetric History OB History  Gravida Para Term Preterm AB Living  2 1 1   1 1   SAB IAB Ectopic Multiple Live Births  1       1    # Outcome Date GA Lbr Len/2nd Weight Sex Delivery Anes PTL Lv  2 Term 03/13/09    M CS-LTranv   LIV  1 SAB  [redacted]w[redacted]d           Past Medical History:  Diagnosis Date  . Chronic back pain   . Depression   . Fibromyalgia   . Headache    migraine  . Hypercholesterolemia   . Neuropathy   . Osteoarthritis   . 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 Seizure (HCC)    pseudo seizures  . TIA (transient ischemic attack)   . Vertigo     Past Surgical History:  Procedure Laterality Date  . CESAREAN SECTION  2011  . OVARIAN CYST SURGERY        Current Outpatient Medications:  .  allopurinol (ZYLOPRIM) 100 MG tablet, TK 1 T PO D, Disp: , Rfl:  .  ibuprofen (ADVIL) 800 MG tablet, Take 1 tablet (800 mg total) by mouth every 8 (eight) hours as needed., Disp: 30 tablet, Rfl: 5 .  meloxicam (MOBIC) 15 MG tablet, TK 1/2 T PO BID PRN. MDD 15 MG, Disp: , Rfl:  .  methocarbamol (ROBAXIN) 750 MG tablet, TK 1 T PO Q 12 H PRN, Disp: , Rfl:  .  oxyCODONE-acetaminophen (PERCOCET) 10-325 MG tablet, TK 1 T PO TID, Disp: , Rfl:  .  pregabalin (LYRICA) 200 MG capsule, Take 1 capsule (200 mg total) by mouth 2 (two) times daily for 30 days., Disp: 60 capsule, Rfl: 0 .  Prenat w/o A-FeCbn-Meth-FA-DHA (PRENATE MINI) 29-0.6-0.4-350 MG CAPS, Take 1 capsule by mouth daily before breakfast., Disp: 30 capsule, Rfl: 11 .  tiZANidine (ZANAFLEX) 4 MG tablet, TK 1 T PO TID PRN, Disp: , Rfl:  .  topiramate (TOPAMAX) 50 MG tablet, Take 1 tablet (50 mg total) by mouth 2 (two) times daily., Disp: 60 tablet, Rfl: 12 .  butalbital-acetaminophen-caffeine (FIORICET) 50-325-40 MG tablet, TK 1-2 CAPLETS PO EVERY 6 HOURS PRN FOR HEADACHES, Disp: , Rfl:  .  DULoxetine (CYMBALTA) 60 MG capsule,  TK ONE C PO D, Disp: , Rfl:  .  Levonorgest-Eth Estrad-Fe Bisg (BALCOLTRA) 0.1-20 MG-MCG(21) TABS, Take 1 tablet by mouth daily. (Patient not taking: Reported on 04/17/2020), Disp: 28 tablet, Rfl: 11 .  metroNIDAZOLE (FLAGYL) 500 MG tablet, Take 1 tablet (500 mg total) by mouth 2 (two) times daily., Disp: 14 tablet, Rfl: 2 .  rizatriptan (MAXALT-MLT) 10 MG disintegrating tablet, Take 1 tablet (10 mg total) by mouth as needed for migraine. May repeat in 2 hours if needed (Patient not taking: Reported on 04/17/2020), Disp: 9 tablet, Rfl: 11 .  traZODone (DESYREL) 50 MG tablet, as needed., Disp: , Rfl:  .  XTAMPZA ER 9 MG C12A, TK 1 C PO BID (Patient not taking: Reported on 04/17/2020), Disp: , Rfl:  Allergies  Allergen Reactions  . Pineapple Swelling    Oral swelling    Social History    Tobacco Use  . Smoking status: Light Tobacco Smoker    Packs/day: 0.25    Types: Cigarettes  . Smokeless tobacco: Never Used  . Tobacco comment: 4-5 per day   Substance Use Topics  . Alcohol use: Yes    Alcohol/week: 0.0 standard drinks    Comment: occ    Family History  Problem Relation Age of Onset  . Neuropathy Mother   . Arthritis Mother   . Hypertension Mother   . Heart disease Maternal Grandmother   . Diabetes Maternal Grandmother   . Breast cancer Maternal Aunt       Review of Systems  Constitutional: negative for fatigue and weight loss Respiratory: negative for cough and wheezing Cardiovascular: negative for chest pain, fatigue and palpitations Gastrointestinal: negative for abdominal pain and change in bowel habits Musculoskeletal:negative for myalgias Neurological: negative for gait problems and tremors Behavioral/Psych: negative for abusive relationship, depression Endocrine: negative for temperature intolerance    Genitourinary:negative for abnormal menstrual periods, genital lesions, hot flashes, sexual problems.  Positive for pelvic pain and vaginal discharge Integument/breast: negative for breast lump, breast tenderness, nipple discharge and skin lesion(s)    Objective:       BP 135/90   Pulse 98   Ht 5\' 2"  (1.575 m)   Wt 264 lb (119.7 kg)   LMP 03/31/2020   BMI 48.29 kg/m  General:   alert and no distress  Skin:   no rash or abnormalities  Lungs:   clear to auscultation bilaterally  Heart:   regular rate and rhythm, S1, S2 normal, no murmur, click, rub or gallop  Breasts:   normal without suspicious masses, skin or nipple changes or axillary nodes  Abdomen:  normal findings: no organomegaly, soft, non-tender and no hernia  Pelvis:  External genitalia: normal general appearance Urinary system: urethral meatus normal and bladder without fullness, nontender Vaginal: normal without tenderness, induration or masses Cervix: normal  appearance Adnexa: normal bimanual exam Uterus: anteverted and non-tender, normal size   Lab Review Urine pregnancy test Labs reviewed yes Radiologic studies reviewed no  50% of 20 min visit spent on counseling and coordination of care.   Assessment:   1. Encounter for gynecological examination with Papanicolaou smear of cervix Rx: - Cytology - PAP( [facility])  2. Pelvic pain Rx: - 05/29/2020 PELVIC COMPLETE WITH TRANSVAGINAL; Future  3. Vaginal discharge Rx: - Cervicovaginal ancillary only( )  4. Urinary frequency Rx: - POCT urinalysis dipstick - Urine Culture  5. Screening for STD (sexually transmitted disease) Rx: - RPR+HBsAg+HCVAb+...    Plan:    Education reviewed: calcium supplements,  depression evaluation, low fat, low cholesterol diet, safe sex/STD prevention, self breast exams and weight bearing exercise. Follow up in: 1 year.    Orders Placed This Encounter  Procedures  . Urine Culture  . US PELVIC COMPLETE WITH TRANSVAGINAL    Standing Status:   Future    Standing Expiration Date:   04/17/2021    Order Specific Question:   Reason for Exam (SYMPTOM  OR DIAGNOSIS REQUIRED)    Answer:   Pelvic pain    Order Specific Question:   Preferred imaging location?    Answer:   [facility]  . RPR+HBsAg+HCVAb+...  . POCT urinalysis dipstick    Brock Bad, MD 04/17/2020 10:41 AM

## 2020-04-17 NOTE — Progress Notes (Signed)
Pt states she is having sharp pains in her vaginal/uterus x 1 week. Pt states pain is constant. Pt has some relief when heat is applied.

## 2020-04-18 LAB — RPR+HBSAG+HCVAB+...
HIV Screen 4th Generation wRfx: NONREACTIVE
Hep C Virus Ab: <0.1 s/co ratio (ref 0.0–0.9)
Hepatitis B Surface Ag: NEGATIVE
RPR Ser Ql: NONREACTIVE

## 2020-04-19 LAB — URINE CULTURE

## 2020-04-20 ENCOUNTER — Other Ambulatory Visit: Payer: Self-pay | Admitting: Obstetrics

## 2020-04-20 DIAGNOSIS — B9689 Other specified bacterial agents as the cause of diseases classified elsewhere: Secondary | ICD-10-CM

## 2020-04-20 LAB — CERVICOVAGINAL ANCILLARY ONLY
Bacterial Vaginitis (gardnerella): POSITIVE — AB
Candida Glabrata: NEGATIVE
Candida Vaginitis: NEGATIVE
Chlamydia: NEGATIVE
Comment: NEGATIVE
Comment: NEGATIVE
Comment: NEGATIVE
Comment: NEGATIVE
Comment: NEGATIVE
Comment: NORMAL
Neisseria Gonorrhea: NEGATIVE
Trichomonas: NEGATIVE

## 2020-04-20 LAB — CYTOLOGY - PAP
Adequacy: ABSENT
Diagnosis: NEGATIVE

## 2020-04-20 MED ORDER — METRONIDAZOLE 500 MG PO TABS: 500.0000 mg | ORAL_TABLET | Freq: Two times a day (BID) | ORAL | 2 refills | Status: DC

## 2020-04-22 ENCOUNTER — Other Ambulatory Visit: Payer: Self-pay

## 2020-04-22 ENCOUNTER — Ambulatory Visit (HOSPITAL_BASED_OUTPATIENT_CLINIC_OR_DEPARTMENT_OTHER)
Admission: RE | Admit: 2020-04-22 | Discharge: 2020-04-22 | Disposition: A | Discharge status: Home / Self Care | Payer: Medicaid Other | Source: Ambulatory Visit | Attending: Obstetrics | Admitting: Obstetrics

## 2020-04-22 DIAGNOSIS — R102 Pelvic and perineal pain: Secondary | ICD-10-CM | POA: Insufficient documentation

## 2020-04-29 ENCOUNTER — Other Ambulatory Visit: Payer: Self-pay

## 2020-04-29 ENCOUNTER — Ambulatory Visit: Payer: Medicaid Other | Attending: Nurse Practitioner | Admitting: Physical Therapy

## 2020-04-29 DIAGNOSIS — G8929 Other chronic pain: Secondary | ICD-10-CM | POA: Diagnosis present

## 2020-04-29 DIAGNOSIS — M6281 Muscle weakness (generalized): Secondary | ICD-10-CM | POA: Insufficient documentation

## 2020-04-29 DIAGNOSIS — M5442 Lumbago with sciatica, left side: Secondary | ICD-10-CM | POA: Diagnosis not present

## 2020-04-29 DIAGNOSIS — M5441 Lumbago with sciatica, right side: Secondary | ICD-10-CM | POA: Diagnosis present

## 2020-04-29 DIAGNOSIS — M5416 Radiculopathy, lumbar region: Secondary | ICD-10-CM | POA: Insufficient documentation

## 2020-04-29 DIAGNOSIS — M62830 Muscle spasm of back: Secondary | ICD-10-CM | POA: Insufficient documentation

## 2020-04-29 NOTE — Patient Instructions (Signed)
TENS UNIT  This is helpful for muscle pain and spasm.   Search and Purchase a TENS 7000 2nd edition at www.tenspros.com or www.amazon.com  (It should be less than $30)     TENS unit instructions:   Do not shower or bathe with the unit on  Turn the unit off before removing electrodes or batteries  If the electrodes lose stickiness add a drop of water to the electrodes after they are disconnected from the unit and place on plastic sheet. If you continued to have difficulty, call the TENS unit company to purchase more electrodes.  Do not apply lotion on the skin area prior to use. Make sure the skin is clean and dry as this will help prolong the life of the electrodes.  After use, always check skin for unusual red areas, rash or other skin difficulties. If there are any skin problems, does not apply electrodes to the same area.  Never remove the electrodes from the unit by pulling the wires.  Do not use the TENS unit or electrodes other than as directed.  Do not change electrode placement without consulting your therapist or physician.  Keep 2 fingers with between each electrode.   TENS stands for Transcutaneous Electrical Nerve Stimulation. In other words, electrical impulses are allowed to pass through the skin in order to excite a nerve.   Purpose and Use of TENS:  TENS is a method used to manage acute and chronic pain without the use of drugs. It has been effective in managing pain associated with surgery, sprains, strains, trauma, rheumatoid arthritis, and neuralgias. It is a non-addictive, low risk, and non-invasive technique used to control pain. It is not, by any means, a curative form of treatment.   How TENS Works:  Most TENS units are a Statistician unit powered by one 9 volt battery. Attached to the outside of the unit are two lead wires where two pins and/or snaps connect on each wire. All units come with a set of four reusable pads or electrodes. These are placed  on the skin surrounding the area involved. By inserting the leads into  the pads, the electricity can pass from the unit making the circuit complete.  As the intensity is turned up slowly, the electrical current enters the body from the electrodes through the skin to the surrounding nerve fibers. This triggers the release of hormones from within the body. These hormones contain pain relievers. By increasing the circulation of these hormones, the person's pain may be lessened. It is also believed that the electrical stimulation itself helps to block the pain messages being sent to the brain, thus also decreasing the body's perception of pain.   Hazards:  TENS units are NOT to be used by patients with PACEMAKERS, DEFIBRILLATORS, DIABETIC PUMPS, PREGNANT WOMEN, and patients with SEIZURE DISORDERS.  TENS units are NOT to be used over the heart, throat, brain, or spinal cord.  One of the major side effects from the TENS unit may be skin irritation. Some people may develop a rash if they are sensitive to the materials used in the electrodes or the connecting wires.   Wear the unit for 15 up to 30-45 minutes at a time, 3-4x/day as needed for pain.   Avoid overuse due the body getting used to the stem making it not as effective over time.

## 2020-04-29 NOTE — Therapy (Signed)
[facility] Outpatient Rehabilitation [facility] [address]  [address] [place], [place], [address] Phone: [phone]   Fax:  [phone]  Physical Therapy Evaluation  Patient Details  Name: Angela Doyle MRN: [id number] Date of Birth: 1986-02-01 Referring Provider (PT): Carmel Sacramento, NP   Encounter Date: 04/29/2020   PT End of Session - 04/29/20 0939    Visit Number 1    Number of Visits 16    Date for PT Re-Evaluation 06/24/20    Authorization Type [employer] Medicaid    PT Start Time [phone]    PT Stop Time 1051    PT Time Calculation (min) 72 min    Activity Tolerance Patient limited by pain    Behavior During Therapy [facility] for tasks assessed/performed           Past Medical History:  Diagnosis Date  . Chronic back pain   . Depression   . Fibromyalgia   . Headache    migraine  . Hypercholesterolemia   . Neuropathy   . Osteoarthritis   . 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 Seizure (HCC)    pseudo seizures  . TIA (transient ischemic attack)   . Vertigo     Past Surgical History:  Procedure Laterality Date  . CESAREAN SECTION  2011  . OVARIAN CYST SURGERY      There were no vitals filed for this visit.    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 Pain limits walking and notes legs will sometimes go numb while sitting. Also notes peripheral neuropathy in B UE (hands & forearms) & B LE (feet up to knees). Legs will go weak sometimes, causing her to fall - uses a cane on bad days. Pt reports MD recommended she wear a brace but states brace (purchased on [employer]) seems to hurt more than it helps.    Limitations Sitting;Standing;Walking;House hold activities    How long can you sit comfortably? 5-10 minutes    How long can you stand comfortably? 5 minutes    How long can you walk comfortably? 5 minutes    Diagnostic tests Lumbar MRI 04/18/18: 1. No  lumbar disc degeneration or acute osseous abnormality.  2. Widespread lumbar facet hypertrophy. No lumbar spinal stenosis,  but up to mild bilateral neural foraminal stenosis at the L3 through  L5 nerve levels.    Patient Stated Goals "to strengthen my legs and to alleviate some pain in my back so I can walk and stand longer"    Currently in Pain? Yes    Pain Score 5     Pain Location Back    Pain Orientation Lower    Pain Descriptors / Indicators Sharp    Pain Radiating Towards pain, numbness & tingling down back (and sometimes side) of legs to toes (R>L)    Aggravating Factors  prolonged sitting or standing    Pain Relieving Factors stretching, rest, heating pad    Effect of Pain on Daily Activities "walking is horrible" - walking in apartment or going out to shopping; limited standing tolerance              [facility] PT Assessment - 04/29/20 0939      Assessment   Medical Diagnosis Lumbar radiculopathy    Referring Provider (PT) Carmel Sacramento, NP    Onset Date/Surgical Date --   chronic but worsening over  past 1-2 yrs   Hand Dominance Right    Next MD Visit 05/06/20    Prior Therapy remote h/o PT for LBP      Precautions   Precautions None    Precaution Comments MD recommended brace for back and sedentary work if she wanted to work      Balance Screen   Has the patient fallen in the past 6 months Yes    How many times? 7    Has the patient had a decrease in activity level because of a fear of falling?  Yes    Is the patient reluctant to leave their home because of a fear of falling?  Yes      Home Environment   Living Environment Private residence    Living Arrangements Children    Type of Home Apartment    Home Access Stairs to enter    Entrance Stairs-Number of Steps 15    Entrance Stairs-Rails Right;Left;Cannot reach both    Home Layout One level    Home Equipment [place] - single point      Prior Function   Level of Independence Independent    Vocation Unemployed     Leisure loves to cook but limited by standing tolerance; would like to be able to walk for exercise but currently unable; read; used to dance      Cognition   Overall Cognitive Status Within Functional Limits for tasks assessed      ROM / Strength   AROM / PROM / Strength AROM;Strength      AROM   AROM Assessment Site Lumbar    Lumbar Flexion hands to mid shins - tight with sharp pain down R LE    Lumbar Extension 50% limited - increased LBP    Lumbar - Right Side Bend hands to lateral knee jt line - increased pain    Lumbar - Left Side Bend hands to lateral knee jt line - tight    Lumbar - Right Rotation 20% limited - increased pain    Lumbar - Left Rotation 10% limited      Strength   Strength Assessment Site Hip;Knee;Ankle    Right/Left Hip Right;Left    Right Hip Flexion 4-/5    Right Hip Extension 2+/5   very limited ROM   Right Hip External Rotation  4-/5   pain   Right Hip Internal Rotation 4/5    Right Hip ABduction 4-/5    Right Hip ADduction 2+/5    Left Hip Flexion 4/5    Left Hip Extension 2+/5   very limited ROM   Left Hip External Rotation 4/5    Left Hip Internal Rotation 4/5    Left Hip ABduction 4/5    Left Hip ADduction 2+/5    Right/Left Knee Right;Left    Right Knee Flexion 3+/5    Right Knee Extension 4-/5    Left Knee Flexion 4/5    Left Knee Extension 4/5    Right/Left Ankle Right;Left    Right Ankle Dorsiflexion 4-/5    Right Ankle Plantar Flexion 3/5   3 SLS heel raises with limited lift   Left Ankle Dorsiflexion 4/5    Left Ankle Plantar Flexion 4-/5   7 SLS heel raises     Flexibility   Soft Tissue Assessment /Muscle Length yes    Hamstrings mod tight B    Quadriceps mod tight quads & mod/severe tight hip flexors B    ITB mod/severe tight B  Piriformis mod tight B    Obturator Internus mod/severe tight R>L      Palpation   Palpation comment increased muscle tension and ttp in R>L lumbar paraspinals, glutes, piriformis, hip flexors,  quads, HS, ITB and calves      Special Tests    Special Tests Lumbar    Lumbar Tests Straight Leg Raise      Straight Leg Raise   Findings Positive    Side  Right   >L                     Objective measurements completed on examination: See above findings.       [facility] Adult PT Treatment/Exercise - 04/29/20 0939      Modalities   Modalities Electrical Stimulation;Moist Heat      Moist Heat Therapy   Number Minutes Moist Heat 15 Minutes    Moist Heat Location Lumbar Spine   seated in chair     Electrical Stimulation   Electrical Stimulation Location Lumbar paraspinals & upper glutes    Electrical Stimulation Action IFC    Electrical Stimulation Parameters 80-150 Hz, intensity to pt tol x 15'    Electrical Stimulation Goals Pain;Tone                  PT Education - 04/29/20 1040    Education Details PT eval findings, anticipated POC & info on home TENS unit    Person(s) Educated Patient    Methods Explanation;Handout    Comprehension Verbalized understanding            PT Short Term Goals - 04/29/20 1051      PT SHORT TERM GOAL #1   Title Patient will be independent with initial HEP    Status New    Target Date 05/20/20      PT SHORT TERM GOAL #2   Title Patient will verbalize/demonstrate understanding of neutral spine posture and proper body mechanics to reduce strain on lumbar spine    Status New    Target Date 05/27/20             PT Long Term Goals - 04/29/20 1308      PT LONG TERM GOAL #1   Title Patient will be independent with ongoing/advanced HEP for self-management at home    Status New    Target Date 06/24/20      PT LONG TERM GOAL #2   Title Patient to demonstrate appropriate posture and body mechanics needed for daily activities    Status New    Target Date 06/24/20      PT LONG TERM GOAL #3   Title Patient to report reduction in frequency and intensity of LBP and LE radicular pain by >/= 25%    Status New     Target Date 06/24/20      PT LONG TERM GOAL #4   Title Patient will demonstrate improved B LE strength to >/= 4+/5 for improved stability and ease of mobility    Status New    Target Date 06/24/20      PT LONG TERM GOAL #5   Title Patient will improve standing and/or walking tolerance to >/=  20-30 minutes w/o pain interference to allow resumption of normal daily activities    Status New    Target Date 06/24/20                  Plan - 04/29/20 1051    Clinical Impression  Statement Angela Doyle is a 35 y/o female who presents to OP PT with chronic LBP with B lumbar radiculopathy. Pain has been an issue for her for a long time pain worsening over the past 1-2 yrs. Pain limits all aspects of daily life including positional tolerance, household chores, caring for her children, as well as preventing her from working (currently seeking disability). Current deficits include pain with B LE radiculopathy and peripheral neuropathy, increased muscle tension, decreased lumbar AROM in all planes, limited proximal LE flexibility and R>L LE weakness. Angela Doyle will benefit from skilled PT to address above deficits to reduce myofascial pain and tightness and improve core strength to restore normal mobility and allow for increased participation in desired activities.    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; TIA    Examination-Activity Limitations Bathing;Bed Mobility;Bend;Caring for Others;Carry;Dressing;Hygiene/Grooming;Lift;Locomotion Level;Reach Overhead;Sit;Sleep;Squat;Stairs;Stand;Toileting;Transfers    Examination-Participation Restrictions Cleaning;Community Activity;Driving;Laundry;Meal Prep;Occupation;Shop    Stability/Clinical Decision  Making Evolving/Moderate complexity    Clinical Decision Making Moderate    Rehab Potential Good    PT Frequency 2x / week    PT Duration 8 weeks    PT Treatment/Interventions ADLs/Self Care Home Management;Cryotherapy;Electrical Stimulation;Iontophoresis 4mg /ml Dexamethasone;Moist Heat;Traction;Ultrasound;Gait training;Stair training;Functional mobility training;Therapeutic activities;Therapeutic exercise;Balance training;Neuromuscular re-education;Patient/family education;Manual techniques;Passive range of motion;Dry needling;Taping;Spinal Manipulations    PT Next Visit Plan Create initial HEP for lumbopelvic flexibilty & strengthening; posture and body mechanics training    Consulted and Agree with Plan of Care Patient           Patient will benefit from skilled therapeutic intervention in order to improve the following deficits and impairments:  Decreased activity tolerance,Decreased endurance,Decreased knowledge of precautions,Decreased mobility,Decreased range of motion,Decreased safety awareness,Decreased strength,Difficulty walking,Hypomobility,Increased fascial restricitons,Increased muscle spasms,Impaired perceived functional ability,Impaired flexibility,Impaired sensation,Improper body mechanics,Postural dysfunction,Pain  Visit Diagnosis: Chronic bilateral low back pain with bilateral sciatica  Radiculopathy, lumbar region  Muscle spasm of back  Muscle weakness (generalized)     Problem List Patient Active Problem List   Diagnosis Date Noted  . 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 Chronic daily headache 08/17/2016  . Chronic pelvic pain in female 06/27/2016  . Bilateral mastodynia 06/27/2016  . Galactorrhea, bilateral 06/27/2016  . Low back pain radiating to both legs 12/29/2015  . Convulsions (HCC) 06/08/2015  . Generalized anxiety disorder 06/08/2015  . Chronic pain syndrome 06/08/2015  . Migraine without aura and without status  migrainosus, not intractable 06/08/2015  . Nonintractable epilepsy with status epilepticus (HCC) 05/28/2015  . Seizure disorder (HCC) 05/28/2015  . Depression 05/28/2015  . Obstructive sleep apnea 05/28/2015  . Tobacco dependence 05/28/2015  . Neuropathy     05/30/2015, PT, MPT 04/29/2020, 1:14 PM  [facility] [address]  [address] [place], [place], [place] Phone: [phone]   Fax:  [phone]  Name: Angela Doyle MRN: Angela Doyle Date of Birth: 1986/02/06

## 2020-05-08 ENCOUNTER — Ambulatory Visit: Payer: Medicaid Other

## 2020-05-12 ENCOUNTER — Ambulatory Visit: Payer: Medicaid Other

## 2020-05-12 ENCOUNTER — Other Ambulatory Visit: Payer: Self-pay

## 2020-05-12 DIAGNOSIS — M6281 Muscle weakness (generalized): Secondary | ICD-10-CM

## 2020-05-12 DIAGNOSIS — M62830 Muscle spasm of back: Secondary | ICD-10-CM

## 2020-05-12 DIAGNOSIS — M5416 Radiculopathy, lumbar region: Secondary | ICD-10-CM

## 2020-05-12 DIAGNOSIS — M5442 Lumbago with sciatica, left side: Secondary | ICD-10-CM | POA: Diagnosis not present

## 2020-05-12 DIAGNOSIS — G8929 Other chronic pain: Secondary | ICD-10-CM

## 2020-05-12 NOTE — Therapy (Signed)
[facility] Outpatient Rehabilitation [facility] [address]  [address] [place], [place], [address] Phone: [phone]   Fax:  [phone]  Physical Therapy Treatment  Patient Details  Name: Angela Doyle MRN: [id number] Date of Birth: 04-12-1985 Referring Provider (PT): Carmel Sacramento, NP   Encounter Date: 05/12/2020   PT End of Session - 05/12/20 0854    Visit Number 2    Number of Visits 16    Date for PT Re-Evaluation 06/24/20    Authorization Type [employer]HC Medicaid    PT Start Time 0848    PT Stop Time 0927    PT Time Calculation (min) 39 min    Activity Tolerance Patient limited by pain;Patient tolerated treatment well    Behavior During Therapy [facility] for tasks assessed/performed           Past Medical History:  Diagnosis Date  . Chronic back pain   . Depression   . Fibromyalgia   . Headache    migraine  . Hypercholesterolemia   . Neuropathy   . Osteoarthritis   . 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 Seizure (HCC)    pseudo seizures  . TIA (transient ischemic attack)   . Vertigo     Past Surgical History:  Procedure Laterality Date  . CESAREAN SECTION  2011  . OVARIAN CYST SURGERY      There were no vitals filed for this visit.   Subjective Assessment - 05/12/20 0851    Subjective Pt reports she was in pain all weekend    Diagnostic tests Lumbar MRI 04/18/18: 1. No lumbar disc degeneration or acute osseous abnormality.  2. Widespread lumbar facet hypertrophy. No lumbar spinal stenosis,  but up to mild bilateral neural foraminal stenosis at the L3 through  L5 nerve levels.    Patient Stated Goals "to strengthen my legs and to alleviate some pain in my back so I can walk and stand longer"    Currently in Pain? Yes    Pain Score 8     Pain Location Back    Pain Orientation Lower    Pain Descriptors / Indicators Sharp    Pain Type Acute pain                             [facility] Adult PT Treatment/Exercise - 05/12/20 0001      Exercises    Exercises Knee/Hip;Lumbar      Lumbar Exercises: Stretches   Passive Hamstring Stretch Right;Left;2 reps;30 seconds;Limitations    Passive Hamstring Stretch Limitations w/ strap    Single Knee to Chest Stretch Right;Left;1 rep;30 seconds    Pelvic Tilt 10 reps   cues for technique and to ant tilt pelvis w/o pain   Piriformis Stretch Right;Left;1 rep;30 seconds      Lumbar Exercises: Aerobic   Nustep L1 x 4 min      Lumbar Exercises: Seated   Other Seated Lumbar Exercises orange pball rollout 10x10 3 way      Modalities   Modalities Moist Heat      Moist Heat Therapy   Number Minutes Moist Heat 10 Minutes    Moist Heat Location Lumbar Spine                  PT Education - 05/12/20 0919    Education Details HEP; Access Code: [email or web address]. Educated pt on positioning with leg elevated in laying down positions to reduce stress on low back. Also educated  her on log rolling when getting up from supine positions to reduce stress on spine and low back    Person(s) Educated Patient    Methods Explanation;Demonstration;Tactile cues;Verbal cues;Handout    Comprehension Verbalized understanding;Returned demonstration;Verbal cues required;Tactile cues required;Need further instruction            PT Short Term Goals - 04/29/20 1051      PT SHORT TERM GOAL #1   Title Patient will be independent with initial HEP    Status New    Target Date 05/20/20      PT SHORT TERM GOAL #2   Title Patient will verbalize/demonstrate understanding of neutral spine posture and proper body mechanics to reduce strain on lumbar spine    Status New    Target Date 05/27/20             PT Long Term Goals - 04/29/20 1308      PT LONG TERM GOAL #1   Title Patient will be independent with ongoing/advanced HEP for self-management at home    Status New    Target Date 06/24/20      PT LONG TERM GOAL #2   Title Patient to demonstrate appropriate posture and body mechanics needed for daily  activities    Status New    Target Date 06/24/20      PT LONG TERM GOAL #3   Title Patient to report reduction in frequency and intensity of LBP and LE radicular pain by >/= 25%    Status New    Target Date 06/24/20      PT LONG TERM GOAL #4   Title Patient will demonstrate improved B LE strength to >/= 4+/5 for improved stability and ease of mobility    Status New    Target Date 06/24/20      PT LONG TERM GOAL #5   Title Patient will improve standing and/or walking tolerance to >/=  20-30 minutes w/o pain interference to allow resumption of normal daily activities    Status New    Target Date 06/24/20                 Plan - 05/12/20 0929    Clinical Impression Statement Pt was somewhat limited by pain todays session, tried nustep with her she was able to tolerate for 4 min until she c/o sharp pain in her R low back. Did mostly lumbopelvic stretches and flexibility today, pt was tight with all stretches except the [facility]. Pt reported slight pain with the stretches but noted that it was tolerable. HEP handout given to pt with written instructions, demonstration given with verbalized understanding and returned demonstration from pt.    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; TIA    PT Frequency 2x / week    PT Duration 8 weeks    PT Treatment/Interventions ADLs/Self Care Home Management;Cryotherapy;Electrical Stimulation;Iontophoresis 4mg /ml Dexamethasone;Moist Heat;Traction;Ultrasound;Gait training;Stair training;Functional mobility training;Therapeutic activities;Therapeutic exercise;Balance training;Neuromuscular re-education;Patient/family education;Manual techniques;Passive range of motion;Dry needling;Taping;Spinal  Manipulations    PT Next Visit Plan Review initial HEP for lumbopelvic flexibilty & strengthening; posture and body mechanics training    Consulted and Agree with Plan of Care Patient           Patient will benefit from skilled therapeutic intervention in order to improve the following deficits and impairments:  Decreased activity tolerance,Decreased endurance,Decreased knowledge  of precautions,Decreased mobility,Decreased range of motion,Decreased safety awareness,Decreased strength,Difficulty walking,Hypomobility,Increased fascial restricitons,Increased muscle spasms,Impaired perceived functional ability,Impaired flexibility,Impaired sensation,Improper body mechanics,Postural dysfunction,Pain  Visit Diagnosis: Chronic bilateral low back pain with bilateral sciatica  Radiculopathy, lumbar region  Muscle spasm of back  Muscle weakness (generalized)     Problem List Patient Active Problem List   Diagnosis Date Noted  . 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 Chronic daily headache 08/17/2016  . Chronic pelvic pain in female 06/27/2016  . Bilateral mastodynia 06/27/2016  . Galactorrhea, bilateral 06/27/2016  . Low back pain radiating to both legs 12/29/2015  . Convulsions (HCC) 06/08/2015  . Generalized anxiety disorder 06/08/2015  . Chronic pain syndrome 06/08/2015  . Migraine without aura and without status migrainosus, not intractable 06/08/2015  . Nonintractable epilepsy with status epilepticus (HCC) 05/28/2015  . Seizure disorder (HCC) 05/28/2015  . Depression 05/28/2015  . Obstructive sleep apnea 05/28/2015  . Tobacco dependence 05/28/2015  . Neuropathy     Darleene Cleaver, PTA 05/12/2020, 10:10 AM  [facility] [address]  [address] [place], [place], [address] Phone: [phone]   Fax:  [phone]  Name: Angela Doyle MRN: [id number] Date of Birth: 1985/05/13

## 2020-05-12 NOTE — Patient Instructions (Signed)
Access Code: [email or web address] URL: [email or web address] Date: 05/12/2020 Prepared by: Verta Ellen  Exercises Seated Table Hamstring Stretch - 2 x daily - 7 x weekly - 1 sets - 2 reps - 30 sec hold Hooklying Single Knee to Chest Stretch - 2 x daily - 7 x weekly - 1 sets - 2 reps - 30 sec hold Supine Piriformis Stretch with Foot on Ground - 2 x daily - 7 x weekly - 1 sets - 2 reps - 30 sec hold Supine Posterior Pelvic Tilt - 1 x daily - 7 x weekly - 3 sets - 10 reps - 3 sec hold

## 2020-05-14 ENCOUNTER — Ambulatory Visit: Payer: Medicaid Other | Admitting: Physical Therapy

## 2020-05-14 ENCOUNTER — Encounter: Payer: Self-pay | Admitting: Physical Therapy

## 2020-05-14 ENCOUNTER — Other Ambulatory Visit: Payer: Self-pay

## 2020-05-14 DIAGNOSIS — M5442 Lumbago with sciatica, left side: Secondary | ICD-10-CM | POA: Diagnosis not present

## 2020-05-14 DIAGNOSIS — M5416 Radiculopathy, lumbar region: Secondary | ICD-10-CM

## 2020-05-14 DIAGNOSIS — G8929 Other chronic pain: Secondary | ICD-10-CM

## 2020-05-14 DIAGNOSIS — M62830 Muscle spasm of back: Secondary | ICD-10-CM

## 2020-05-14 DIAGNOSIS — M6281 Muscle weakness (generalized): Secondary | ICD-10-CM

## 2020-05-14 NOTE — Therapy (Signed)
[facility] [address]  [address] [place], [place], [address] Phone: [phone]   Fax:  [phone]  Physical Therapy Treatment  Patient Details  Name: Angela Doyle MRN: [id number] Date of Birth: 1986/02/25 Referring Provider (PT): Carmel Sacramento, NP   Encounter Date: 05/14/2020   PT End of Session - 05/14/20 1025    Visit Number 3    Number of Visits 16    Date for PT Re-Evaluation 06/24/20    Authorization Type U[employer] Medicaid - VL: 27    PT Start Time 1025    PT Stop Time 1119    PT Time Calculation (min) 54 min    Activity Tolerance Patient limited by pain;Patient tolerated treatment well    Behavior During Therapy [facility] for tasks assessed/performed           Past Medical History:  Diagnosis Date  . Chronic back pain   . Depression   . Fibromyalgia   . Headache    migraine  . Hypercholesterolemia   . Neuropathy   . Osteoarthritis   . 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 Seizure (HCC)    pseudo seizures  . TIA (transient ischemic attack)   . Vertigo     Past Surgical History:  Procedure Laterality Date  . CESAREAN SECTION  2011  . OVARIAN CYST SURGERY      There were no vitals filed for this visit.   Subjective Assessment - 05/14/20 1029    Subjective Pt reports she feels more achy and stiff today - she attributes this to the weather.    Diagnostic tests Lumbar MRI 04/18/18: 1. No lumbar disc degeneration or acute osseous abnormality.  2. Widespread lumbar facet hypertrophy. No lumbar spinal stenosis,  but up to mild bilateral neural foraminal stenosis at the L3 through  L5 nerve levels.    Patient Stated Goals "to strengthen my legs and to alleviate some pain in my back so I can walk and stand longer"    Currently in Pain? Yes    Pain Score 6     Pain Location Back    Pain Orientation Lower    Pain Descriptors / Indicators Aching   "stiff"   Pain Type Acute pain    Pain Radiating Towards pain, numbness & tingling  down back (and sometimes side) of legs to toes (R>L)    Pain Frequency Constant                             [facility] Adult PT Treatment/Exercise - 05/14/20 1025      Transfers   Comments reviewed log rolling and side-lying to/from sit to reduce back strain      Exercises   Exercises Lumbar      Lumbar Exercises: Stretches   Passive Hamstring Stretch Right;Left;1 rep;30 seconds    Passive Hamstring Stretch Limitations seated hip hinge   pt noting difficulty with leg elevated on bed/table, therefore transitioned postioning to foot oin floor   Single Knee to Chest Stretch Right;Left;1 rep;30 seconds    Single Knee to Chest Stretch Limitations towel used to extend reach    Prone on Elbows Stretch 2 reps;30 seconds    Prone on Elbows Stretch Limitations pt reporting increased intensity of LE radiculopathy on 2nd rep    ITB Stretch Right;2 reps;30 seconds    ITB Stretch Limitations supine with strap & standing lateral lean over back of chair - poor tolerance  for both d/t pain    Piriformis Stretch Right;Left;1 rep;30 seconds    Piriformis Stretch Limitations supine KTOS - towel used to extend reach    Other Lumbar Stretch Exercise prone lying 2 x 60 sec      Lumbar Exercises: Aerobic   Recumbent Bike L2 x 6 min      Modalities   Modalities Moist Heat      Moist Heat Therapy   Number Minutes Moist Heat 15 Minutes    Moist Heat Location Lumbar Spine;Hip   seated in chair; R hip     Electrical Stimulation   Electrical Stimulation Location Lumbar paraspinals & upper glutes    Electrical Stimulation Action IFC    Electrical Stimulation Parameters 80-150 Hz, intensity to pt tol x 15'    Electrical Stimulation Goals Pain;Tone      Manual Therapy   Manual Therapy Soft tissue mobilization;Myofascial release    Soft tissue mobilization STM/DTM to R glutes - very ttp    Myofascial Release manual TPR to R glute med/min - limited tolerance with no relief noted                     PT Short Term Goals - 05/14/20 1029      PT SHORT TERM GOAL #1   Title Patient will be independent with initial HEP    Status On-going   05/15/18 - minor modifcations made to HEP   Target Date 05/20/20      PT SHORT TERM GOAL #2   Title Patient will verbalize/demonstrate understanding of neutral spine posture and proper body mechanics to reduce strain on lumbar spine    Status On-going    Target Date 05/27/20             PT Long Term Goals - 05/14/20 1029      PT LONG TERM GOAL #1   Title Patient will be independent with ongoing/advanced HEP for self-management at home    Status On-going    Target Date 06/24/20      PT LONG TERM GOAL #2   Title Patient to demonstrate appropriate posture and body mechanics needed for daily activities    Status On-going    Target Date 06/24/20      PT LONG TERM GOAL #3   Title Patient to report reduction in frequency and intensity of LBP and LE radicular pain by >/= 25%    Status On-going    Target Date 06/24/20      PT LONG TERM GOAL #4   Title Patient will demonstrate improved B LE strength to >/= 4+/5 for improved stability and ease of mobility    Status On-going    Target Date 06/24/20      PT LONG TERM GOAL #5   Title Patient will improve standing and/or walking tolerance to >/=  20-30 minutes w/o pain interference to allow resumption of normal daily activities    Status On-going    Target Date 06/24/20                 Plan - 05/14/20 1119    Clinical Impression Statement Angela Doyle continues to report moderate to high levels of pain in low back as well as lateral R hip when walking. She is very ttp over R greater trochanter as well as R glutes and TLF - limited tolerance for STM/MFR to glutes and/or attempted ITB stretching in various positions d/t pain. She may benefit from DN if increased pain and  muscle tension persists. She may also benefit from trial of ionto patch for probable R GT bursitis, but still  awaiting NP signature on cert orders therefore unable to initiate this today. Initial HEP reviewed with minor modifications made to stretches to assist with reach or allow for better tolerance pain-wise with HEP. Given ongoing radicular symptoms, initiated extension program in prone but pt reporting increased tension in low back and increased in radicular symptoms with 2nd attempt at POE, therefore deferred further prone activities today. Session concluded with estim and moist heat to help reduce pain and muscle tension. Pt reports she has ordered a home TENS unit but has yet to receive it.    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; TIA    Rehab Potential Good    PT Frequency 2x / week    PT Duration 8 weeks    PT Treatment/Interventions ADLs/Self Care Home Management;Cryotherapy;Electrical Stimulation;Iontophoresis 4mg /ml Dexamethasone;Moist Heat;Traction;Ultrasound;Gait training;Stair training;Functional mobility training;Therapeutic activities;Therapeutic exercise;Balance training;Neuromuscular re-education;Patient/family education;Manual techniques;Passive range of motion;Dry needling;Taping;Spinal Manipulations    PT Next Visit Plan posture and body mechanics training; lumbopelvic flexibilty & strengthening; review HEP & update as indicated; manual therapy and modalities as indicated and benefit noted    PT Home Exercise Plan MedBridge Access Code: [email or web address] (3/15)    Consulted and Agree with Plan of Care Patient           Patient will benefit from skilled therapeutic intervention in order to improve the following deficits and impairments:  Decreased activity tolerance,Decreased endurance,Decreased knowledge of precautions,Decreased  mobility,Decreased range of motion,Decreased safety awareness,Decreased strength,Difficulty walking,Hypomobility,Increased fascial restricitons,Increased muscle spasms,Impaired perceived functional ability,Impaired flexibility,Impaired sensation,Improper body mechanics,Postural dysfunction,Pain  Visit Diagnosis: Chronic bilateral low back pain with bilateral sciatica  Radiculopathy, lumbar region  Muscle spasm of back  Muscle weakness (generalized)     Problem List Patient Active Problem List   Diagnosis Date Noted  . 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 Chronic daily headache 08/17/2016  . Chronic pelvic pain in female 06/27/2016  . Bilateral mastodynia 06/27/2016  . Galactorrhea, bilateral 06/27/2016  . Low back pain radiating to both legs 12/29/2015  . Convulsions (HCC) 06/08/2015  . Generalized anxiety disorder 06/08/2015  . Chronic pain syndrome 06/08/2015  . Migraine without aura and without status migrainosus, not intractable 06/08/2015  . Nonintractable epilepsy with status epilepticus (HCC) 05/28/2015  . Seizure disorder (HCC) 05/28/2015  . Depression 05/28/2015  . Obstructive sleep apnea 05/28/2015  . Tobacco dependence 05/28/2015  . Neuropathy     05/30/2015, PT, MPT 05/14/2020, 11:59 AM  [facility] [address]  [address] [place], [place], [place] Phone: [phone]   Fax:  [phone]  Name: Angela Doyle MRN: [name] Date of Birth: 1985-12-13

## 2020-05-18 ENCOUNTER — Ambulatory Visit: Payer: Medicaid Other | Admitting: Physical Therapy

## 2020-05-20 ENCOUNTER — Ambulatory Visit: Payer: Medicaid Other

## 2020-05-20 ENCOUNTER — Other Ambulatory Visit: Payer: Self-pay

## 2020-05-20 DIAGNOSIS — M5442 Lumbago with sciatica, left side: Secondary | ICD-10-CM | POA: Diagnosis not present

## 2020-05-20 DIAGNOSIS — G8929 Other chronic pain: Secondary | ICD-10-CM

## 2020-05-20 DIAGNOSIS — M5416 Radiculopathy, lumbar region: Secondary | ICD-10-CM

## 2020-05-20 DIAGNOSIS — M6281 Muscle weakness (generalized): Secondary | ICD-10-CM

## 2020-05-20 DIAGNOSIS — M62830 Muscle spasm of back: Secondary | ICD-10-CM

## 2020-05-20 NOTE — Therapy (Signed)
[facility] [address]  [address] [place], [place], [address] Phone: [phone]   Fax:  [phone]  Physical Therapy Treatment  Patient Details  Name: Angela Doyle MRN: [id number] Date of Birth: Jul 29, 1985 Referring Provider (PT): Carmel Sacramento, NP   Encounter Date: 05/20/2020   PT End of Session - 05/20/20 1040    Visit Number 4    Number of Visits 16    Date for PT Re-Evaluation 06/24/20    Authorization Type UHC Medicaid - VL: 27    PT Start Time 0931    PT Stop Time 1026    PT Time Calculation (min) 55 min    Activity Tolerance Patient limited by pain;Patient tolerated treatment well    Behavior During Therapy [facility] for tasks assessed/performed           Past Medical History:  Diagnosis Date  . Chronic back pain   . Depression   . Fibromyalgia   . Headache    migraine  . Hypercholesterolemia   . Neuropathy   . Osteoarthritis   . 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 Seizure (HCC)    pseudo seizures  . TIA (transient ischemic attack)   . Vertigo     Past Surgical History:  Procedure Laterality Date  . CESAREAN SECTION  2011  . OVARIAN CYST SURGERY      There were no vitals filed for this visit.   Subjective Assessment - 05/20/20 0932    Subjective Pt reports that she had to stop d/t pain last session,    Diagnostic tests Lumbar MRI 04/18/18: 1. No lumbar disc degeneration or acute osseous abnormality.  2. Widespread lumbar facet hypertrophy. No lumbar spinal stenosis,  but up to mild bilateral neural foraminal stenosis at the L3 through  L5 nerve levels.    Patient Stated Goals "to strengthen my legs and to alleviate some pain in my back so I can walk and stand longer"    Currently in Pain? Yes    Pain Score 6     Pain Location --   knee and hip   Pain Descriptors / Indicators Aching    Pain Type Acute pain                             [facility] Adult PT Treatment/Exercise - 05/20/20 0001       Exercises   Exercises Lumbar      Lumbar Exercises: Stretches   Passive Hamstring Stretch Right;Left;1 rep;30 seconds    Passive Hamstring Stretch Limitations supine    Single Knee to Chest Stretch Right;Left;1 rep;30 seconds    Single Knee to Chest Stretch Limitations passive; supine    Piriformis Stretch Right;Left;1 rep;30 seconds    Piriformis Stretch Limitations supine passive KTOS      Lumbar Exercises: Aerobic   Recumbent Bike L3x71min      Lumbar Exercises: Supine   Clam 10 reps;Limitations    Clam Limitations rtband    Bridge Compliant;5 reps    Bridge Limitations Pt c/o pain on R low back but tolerable    Straight Leg Raise 5 reps;2 seconds   B   Straight Leg Raises Limitations c/o increase pulling in R leg      Lumbar Exercises: Quadruped   Other Quadruped Lumbar Exercises childs pose 2x30 sec      Modalities   Modalities Moist Heat      Moist Heat Therapy  Number Minutes Moist Heat 12 Minutes    Moist Heat Location Lumbar Spine;Hip      Electrical Stimulation   Electrical Stimulation Location Lumbar paraspinals & upper glutes    Electrical Stimulation Action IFC    Electrical Stimulation Parameters 80-150Hz  intensity to pt tolerance 16'    Electrical Stimulation Goals Pain;Tone      Manual Therapy   Manual Therapy Soft tissue mobilization;Myofascial release    Soft tissue mobilization STM/DTM to R glutes/piriformis    Myofascial Release manual TPR to R glutes/piriformis                  PT Education - 05/20/20 1027    Education Details HEP update; Access Code: [email or web address]    Person(s) Educated Patient    Methods Explanation;Demonstration;Tactile cues;Verbal cues;Handout    Comprehension Verbalized understanding;Returned demonstration;Verbal cues required;Tactile cues required;Need further instruction            PT Short Term Goals - 05/14/20 1029      PT SHORT TERM GOAL #1   Title Patient will be independent with initial HEP    Status  On-going   05/15/18 - minor modifcations made to HEP   Target Date 05/20/20      PT SHORT TERM GOAL #2   Title Patient will verbalize/demonstrate understanding of neutral spine posture and proper body mechanics to reduce strain on lumbar spine    Status On-going    Target Date 05/27/20             PT Long Term Goals - 05/14/20 1029      PT LONG TERM GOAL #1   Title Patient will be independent with ongoing/advanced HEP for self-management at home    Status On-going    Target Date 06/24/20      PT LONG TERM GOAL #2   Title Patient to demonstrate appropriate posture and body mechanics needed for daily activities    Status On-going    Target Date 06/24/20      PT LONG TERM GOAL #3   Title Patient to report reduction in frequency and intensity of LBP and LE radicular pain by >/= 25%    Status On-going    Target Date 06/24/20      PT LONG TERM GOAL #4   Title Patient will demonstrate improved B LE strength to >/= 4+/5 for improved stability and ease of mobility    Status On-going    Target Date 06/24/20      PT LONG TERM GOAL #5   Title Patient will improve standing and/or walking tolerance to >/=  20-30 minutes w/o pain interference to allow resumption of normal daily activities    Status On-going    Target Date 06/24/20                 Plan - 05/20/20 1028    Clinical Impression Statement Pt was able to complete the exercises today under low resistance and repititions, after starting with the stretches. She had c/o increased pulling and mod pain during exercises but noted that it was tolerable. Educated her on keep her core activated while doing the exercises to help stabilize the spine and reduce the pain in her R low back. Tried DTM to her R piriformis and glutes she did cont to have a lot of tightness in thoses areas with notes of burning sensation down the R LE but tolerable. She also reported that the massage helped to loosen the area and decrease her pain,  afterwards. Estim and heat post session to increase tissue extensibility and decrease pain.    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; TIA    PT Frequency 2x / week    PT Duration 8 weeks    PT Treatment/Interventions ADLs/Self Care Home Management;Cryotherapy;Electrical Stimulation;Iontophoresis 4mg /ml Dexamethasone;Moist Heat;Traction;Ultrasound;Gait training;Stair training;Functional mobility training;Therapeutic activities;Therapeutic exercise;Balance training;Neuromuscular re-education;Patient/family education;Manual techniques;Passive range of motion;Dry needling;Taping;Spinal Manipulations    PT Next Visit Plan posture and body mechanics training; lumbopelvic flexibilty & strengthening; review HEP & update as indicated; manual therapy and modalities as indicated and benefit noted    PT Home Exercise Plan MedBridge Access Code: [email or web address] (3/15), update (3/23)- Access Code: [email or web address]    Consulted and Agree with Plan of Care Patient           Patient will benefit from skilled therapeutic intervention in order to improve the following deficits and impairments:  Decreased activity tolerance,Decreased endurance,Decreased knowledge of precautions,Decreased mobility,Decreased range of motion,Decreased safety awareness,Decreased strength,Difficulty walking,Hypomobility,Increased fascial restricitons,Increased muscle spasms,Impaired perceived functional ability,Impaired flexibility,Impaired sensation,Improper body mechanics,Postural dysfunction,Pain  Visit Diagnosis: Chronic bilateral low back pain with bilateral sciatica  Radiculopathy, lumbar region  Muscle spasm of back  Muscle weakness (generalized)     Problem List Patient  Active Problem List   Diagnosis Date Noted  . 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 Chronic daily headache 08/17/2016  . Chronic pelvic pain in female 06/27/2016  . Bilateral mastodynia 06/27/2016  . Galactorrhea, bilateral 06/27/2016  . Low back pain radiating to both legs 12/29/2015  . Convulsions (HCC) 06/08/2015  . Generalized anxiety disorder 06/08/2015  . Chronic pain syndrome 06/08/2015  . Migraine without aura and without status migrainosus, not intractable 06/08/2015  . Nonintractable epilepsy with status epilepticus (HCC) 05/28/2015  . Seizure disorder (HCC) 05/28/2015  . Depression 05/28/2015  . Obstructive sleep apnea 05/28/2015  . Tobacco dependence 05/28/2015  . Neuropathy     05/30/2015, PTA 05/20/2020, 10:44 AM  [facility] [address]  [address] [place], [place], [place] Phone: [phone]   Fax:  [phone]  Name: Angela Doyle MRN: Angela Doyle Date of Birth: 12/20/1985

## 2020-05-25 ENCOUNTER — Ambulatory Visit: Payer: Medicaid Other | Admitting: Physical Therapy

## 2020-05-25 ENCOUNTER — Other Ambulatory Visit: Payer: Self-pay

## 2020-05-25 ENCOUNTER — Encounter: Payer: Self-pay | Admitting: Physical Therapy

## 2020-05-25 DIAGNOSIS — M5442 Lumbago with sciatica, left side: Secondary | ICD-10-CM | POA: Diagnosis not present

## 2020-05-25 DIAGNOSIS — M62830 Muscle spasm of back: Secondary | ICD-10-CM

## 2020-05-25 DIAGNOSIS — M6281 Muscle weakness (generalized): Secondary | ICD-10-CM

## 2020-05-25 DIAGNOSIS — G8929 Other chronic pain: Secondary | ICD-10-CM

## 2020-05-25 DIAGNOSIS — M5416 Radiculopathy, lumbar region: Secondary | ICD-10-CM

## 2020-05-25 NOTE — Patient Instructions (Signed)
   Kinesiology tape  What is kinesiology tape?  There are many brands of kinesiology tape. KTape, Rock Tape, Body Sport, Dynamic tape, to name a few.  It is an elasticized tape designed to support the body's natural healing process. This tape provides stability and support to muscles and joints without restricting motion.  It can also help decrease swelling in the area of application.  How does it work?  The tape microscopically lifts and decompresses the skin to allow for drainage of lymph (swelling) to flow away from area, reducing inflammation. The tape has the ability to help re-educate the neuromuscular system by targeting specific receptors in the skin. The presence of the tape increases the body's awareness of posture and body mechanics.  Do not use with:  . Open wounds . Skin lesions . Adhesive allergies  In some rare cases, mild/moderate skin irritation can occur. This can include redness, itchiness, or hives. If this occurs, immediately remove tape and consult your primary care physician if symptoms are severe or do not resolve within 2 days.  Safe removal of the tape:  To remove tape safely, hold nearby skin with one hand and gentle roll tape down with other hand. You can apply oil or conditioner to tape while in shower prior to removal to loosen adhesive. DO NOT swiftly rip tape off like a band-aid, as this could cause skin tears and additional skin irritation.     For questions, please contact your therapist at:  [facility] [place] [address]  [address] [place], [place], [address] Phone: [phone]   Fax:  [phone]     

## 2020-05-25 NOTE — Therapy (Signed)
[facility] [address]  [address] [place], [place], [address] Phone: [phone]   Fax:  [phone]  Physical Therapy Treatment  Patient Details  Name: Angela Doyle MRN: [id number] Date of Birth: [birth year] Referring Provider (PT): Carmel Sacramento, NP   Encounter Date: 05/25/2020   PT End of Session - 05/25/20 1142    Visit Number 5    Number of Visits 16    Date for PT Re-Evaluation 06/24/20    Authorization Type [employer] Medicaid - VL: 27    PT Start Time 1059    PT Stop Time 1207    PT Time Calculation (min) 68 min    Activity Tolerance Patient limited by pain;Patient tolerated treatment well    Behavior During Therapy [facility] for tasks assessed/performed           Past Medical History:  Diagnosis Date  . Chronic back pain   . Depression   . Fibromyalgia   . Headache    migraine  . Hypercholesterolemia   . Neuropathy   . Osteoarthritis   . 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 Seizure (HCC)    pseudo seizures  . TIA (transient ischemic attack)   . Vertigo     Past Surgical History:  Procedure Laterality Date  . CESAREAN SECTION  2011  . OVARIAN CYST SURGERY      There were no vitals filed for this visit.   Subjective Assessment - 05/25/20 1102    Subjective Pt. reports a lot of pain mainly in her R hip. This has gotten worse and keeps her from moving much at all. Reports the hamstring and piriformis stretches do help somewhat.    Diagnostic tests Lumbar MRI 04/18/18: 1. No lumbar disc degeneration or acute osseous abnormality.  2. Widespread lumbar facet hypertrophy. No lumbar spinal stenosis,  but up to mild bilateral neural foraminal stenosis at the L3 through  L5 nerve levels.    Patient Stated Goals "to strengthen my legs and to alleviate some pain in my back so I can walk and stand longer"    Currently in Pain? Yes    Pain Score 8     Pain Location Hip    Pain Orientation Right    Pain Descriptors / Indicators  Aching;Sharp    Pain Type Acute pain    Pain Frequency Constant              [facility] PT Assessment - 05/25/20 1059      Assessment   Medical Diagnosis Lumbar radiculopathy                         [facility] Adult PT Treatment/Exercise - 05/25/20 1059      Exercises   Exercises Lumbar      Lumbar Exercises: Stretches   Passive Hamstring Stretch Right;Left;1 rep;30 seconds    Passive Hamstring Stretch Limitations supine; Right was limited by pain    Single Knee to Chest Stretch Right;Left;1 rep;30 seconds    Single Knee to Chest Stretch Limitations passive; supine; right was limited by pain      Lumbar Exercises: Aerobic   Recumbent Bike L3 x 5 minutes   Time was not complete as limited by pain     Modalities   Modalities Electrical Stimulation;Moist Heat      Moist Heat Therapy   Number Minutes Moist Heat 15 Minutes    Moist Heat Location Lumbar Spine;Other (comment)   Buttock  Programme researcher, broadcasting/film/video Location Lumbar paraspinals & upper glutes    Electrical Stimulation Action IFC    Electrical Stimulation Parameters 80-150Hz  intensity to pt tolerance 16'    Electrical Stimulation Goals Pain;Tone      Manual Therapy   Manual Therapy Soft tissue mobilization;Myofascial release;Taping    Joint Mobilization Assessments L3-5 CPA's and UPA's; nothing centralized or relieved.    Soft tissue mobilization STM/DTM to R glutes/piriformis    Myofascial Release Myofascial release to R glutes/piriformis    Kinesiotex Create Space;Inhibit Muscle      Kinesiotix   Create Space Star Pattern over R Greater Trochanter 50% stretch    Inhibit Muscle  Vertical strips over Lumbar paraspinals at 30% stretch, Perpendicular strip over level of most irritability 50% stretch                  PT Education - 05/25/20 1142    Education Details Education on kinesiotaping    Person(s) Educated Patient    Methods  Handout;Explanation;Demonstration;Tactile cues;Verbal cues    Comprehension Verbalized understanding            PT Short Term Goals - 05/25/20 1905      PT SHORT TERM GOAL #1   Title Patient will be independent with initial HEP    Status On-going   05/25/2020 - Pain still limiting performance   Target Date 05/20/20      PT SHORT TERM GOAL #2   Title Patient will verbalize/demonstrate understanding of neutral spine posture and proper body mechanics to reduce strain on lumbar spine    Status On-going    Target Date 05/27/20             PT Long Term Goals - 05/14/20 1029      PT LONG TERM GOAL #1   Title Patient will be independent with ongoing/advanced HEP for self-management at home    Status On-going    Target Date 06/24/20      PT LONG TERM GOAL #2   Title Patient to demonstrate appropriate posture and body mechanics needed for daily activities    Status On-going    Target Date 06/24/20      PT LONG TERM GOAL #3   Title Patient to report reduction in frequency and intensity of LBP and LE radicular pain by >/= 25%    Status On-going    Target Date 06/24/20      PT LONG TERM GOAL #4   Title Patient will demonstrate improved B LE strength to >/= 4+/5 for improved stability and ease of mobility    Status On-going    Target Date 06/24/20      PT LONG TERM GOAL #5   Title Patient will improve standing and/or walking tolerance to >/=  20-30 minutes w/o pain interference to allow resumption of normal daily activities    Status On-going    Target Date 06/24/20                 Plan - 05/25/20 1200    Clinical Impression Statement Avalynne reports her pain has worsened and is mainly in her R hip. She reports HEP does bring some relief when she is able to do them. She presents with R hip pain that is highly irritable and prevented her from completing exercises. STM and DTM was performed to relieve muscle tightness. CPA's and UPA's were assessed, only exacerbated  symptoms. Taping was provided for muscle alignment and stretching. Pt. will continue to  benefit from skilled PT to relieve muscle tightness and strengthen hip weakness to improve her function for pain free mobility.    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; TIA    PT Frequency 2x / week    PT Duration 8 weeks    PT Treatment/Interventions ADLs/Self Care Home Management;Cryotherapy;Electrical Stimulation;Iontophoresis 4mg /ml Dexamethasone;Moist Heat;Traction;Ultrasound;Gait training;Stair training;Functional mobility training;Therapeutic activities;Therapeutic exercise;Balance training;Neuromuscular re-education;Patient/family education;Manual techniques;Passive range of motion;Dry needling;Taping;Spinal Manipulations    PT Next Visit Plan STM/DTM to hip PRN; Hip flxibility; posture and body mechanics training; lumbopelvic flexibilty & strengthening; review HEP & update as indicated; manual therapy and modalities as indicated and benefit noted    PT Home Exercise Plan MedBridge Access Code: [email or web address] (3/15), update (3/23)- Access Code: [email or web address]    Consulted and Agree with Plan of Care Patient           Patient will benefit from skilled therapeutic intervention in order to improve the following deficits and impairments:  Decreased activity tolerance,Decreased endurance,Decreased knowledge of precautions,Decreased mobility,Decreased range of motion,Decreased safety awareness,Decreased strength,Difficulty walking,Hypomobility,Increased fascial restricitons,Increased muscle spasms,Impaired perceived functional ability,Impaired flexibility,Impaired sensation,Improper body mechanics,Postural dysfunction,Pain  Visit Diagnosis: Chronic bilateral  low back pain with bilateral sciatica  Radiculopathy, lumbar region  Muscle spasm of back  Muscle weakness (generalized)     Problem List Patient Active Problem List   Diagnosis Date Noted  . 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 Chronic daily headache 08/17/2016  . Chronic pelvic pain in female 06/27/2016  . Bilateral mastodynia 06/27/2016  . Galactorrhea, bilateral 06/27/2016  . Low back pain radiating to both legs 12/29/2015  . Convulsions (HCC) 06/08/2015  . Generalized anxiety disorder 06/08/2015  . Chronic pain syndrome 06/08/2015  . Migraine without aura and without status migrainosus, not intractable 06/08/2015  . Nonintractable epilepsy with status epilepticus (HCC) 05/28/2015  . Seizure disorder (HCC) 05/28/2015  . Depression 05/28/2015  . Obstructive sleep apnea 05/28/2015  . Tobacco dependence 05/28/2015  . Neuropathy     05/30/2015 SPT 05/25/2020, 7:07 PM  [facility] [address]  [address] [place], [place], [place] Phone: [phone]   Fax:  [phone]  Name: Kameko Hukill MRN: [name] Date of Birth: 12-Apr-1985

## 2020-05-29 ENCOUNTER — Other Ambulatory Visit: Payer: Self-pay

## 2020-05-29 ENCOUNTER — Ambulatory Visit: Payer: Medicaid Other | Attending: Nurse Practitioner

## 2020-05-29 DIAGNOSIS — M5442 Lumbago with sciatica, left side: Secondary | ICD-10-CM | POA: Insufficient documentation

## 2020-05-29 DIAGNOSIS — M6281 Muscle weakness (generalized): Secondary | ICD-10-CM | POA: Insufficient documentation

## 2020-05-29 DIAGNOSIS — M5416 Radiculopathy, lumbar region: Secondary | ICD-10-CM | POA: Diagnosis present

## 2020-05-29 DIAGNOSIS — M62830 Muscle spasm of back: Secondary | ICD-10-CM | POA: Diagnosis present

## 2020-05-29 DIAGNOSIS — G8929 Other chronic pain: Secondary | ICD-10-CM | POA: Insufficient documentation

## 2020-05-29 DIAGNOSIS — M5441 Lumbago with sciatica, right side: Secondary | ICD-10-CM | POA: Insufficient documentation

## 2020-05-29 NOTE — Therapy (Signed)
[facility] [address]  [address] [place], [place], [address] Phone: [phone]   Fax:  [phone]  Physical Therapy Treatment  Patient Details  Name: Angela Doyle MRN: [id number] Date of Birth: April 24, 1985 Referring Provider (PT): Carmel Sacramento, NP   Encounter Date: 05/29/2020   PT End of Session - 05/29/20 0904    Visit Number 6    Number of Visits 16    Date for PT Re-Evaluation 06/24/20    Authorization Type [employer] Medicaid - VL: 27    PT Start Time 0816   pt late   PT Stop Time 0857    PT Time Calculation (min) 41 min    Activity Tolerance Patient limited by pain;Patient tolerated treatment well    Behavior During Therapy [facility] for tasks assessed/performed           Past Medical History:  Diagnosis Date  . Chronic back pain   . Depression   . Fibromyalgia   . Headache    migraine  . Hypercholesterolemia   . Neuropathy   . Osteoarthritis   . 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 Seizure (HCC)    pseudo seizures  . TIA (transient ischemic attack)   . Vertigo     Past Surgical History:  Procedure Laterality Date  . CESAREAN SECTION  2011  . OVARIAN CYST SURGERY      There were no vitals filed for this visit.   Subjective Assessment - 05/29/20 0817    Subjective Pt has been having flare ups from sitting at her desk while working.    Diagnostic tests Lumbar MRI 04/18/18: 1. No lumbar disc degeneration or acute osseous abnormality.  2. Widespread lumbar facet hypertrophy. No lumbar spinal stenosis,  but up to mild bilateral neural foraminal stenosis at the L3 through  L5 nerve levels.    Patient Stated Goals "to strengthen my legs and to alleviate some pain in my back so I can walk and stand longer"    Currently in Pain? Yes    Pain Score 5     Pain Location Hip    Pain Orientation Right    Pain Descriptors / Indicators Aching;Sharp    Pain Type Acute pain                             [facility] Adult PT  Treatment/Exercise - 05/29/20 0001      Exercises   Exercises Lumbar      Lumbar Exercises: Stretches   Single Knee to Chest Stretch Right;Left;1 rep;30 seconds    Single Knee to Chest Stretch Limitations supine    Piriformis Stretch Right;30 seconds;3 reps    Piriformis Stretch Limitations supine KTOS      Lumbar Exercises: Aerobic   Nustep L3x4min      Lumbar Exercises: Supine   Pelvic Tilt 10 reps    Pelvic Tilt Limitations 3 sec hold; tactile cueing for ant and post pelvic tilt    Other Supine Lumbar Exercises ball squeeze 10x5"      Modalities   Modalities Electrical Stimulation;Moist Heat      Moist Heat Therapy   Number Minutes Moist Heat 12 Minutes    Moist Heat Location Lumbar Spine;Other (comment)      Programme researcher, broadcasting/film/video Location Lumbar paraspinals & upper glutes    Electrical Stimulation Action IFC    Electrical Stimulation Parameters 80-150Hz  intensity to pt tolerance  Electrical Stimulation Goals Pain;Tone                    PT Short Term Goals - 05/25/20 1905      PT SHORT TERM GOAL #1   Title Patient will be independent with initial HEP    Status On-going   05/25/2020 - Pain still limiting performance   Target Date 05/20/20      PT SHORT TERM GOAL #2   Title Patient will verbalize/demonstrate understanding of neutral spine posture and proper body mechanics to reduce strain on lumbar spine    Status On-going    Target Date 05/27/20             PT Long Term Goals - 05/25/20 1907      PT LONG TERM GOAL #1   Title Patient will be independent with ongoing/advanced HEP for self-management at home    Status On-going      PT LONG TERM GOAL #2   Title Patient to demonstrate appropriate posture and body mechanics needed for daily activities    Status On-going      PT LONG TERM GOAL #3   Title Patient to report reduction in frequency and intensity of LBP and LE radicular pain by >/= 25%    Status On-going       PT LONG TERM GOAL #4   Title Patient will demonstrate improved B LE strength to >/= 4+/5 for improved stability and ease of mobility    Status On-going      PT LONG TERM GOAL #5   Title Patient will improve standing and/or walking tolerance to >/=  20-30 minutes w/o pain interference to allow resumption of normal daily activities    Status On-going                 Plan - 05/29/20 0848    Clinical Impression Statement Pt arrived late session d/t her transportation bringing her late. She still is having pain in her R hip as she was unable to do any strengthening d/t pain. Did some light lumbopelvic mobility and hip strengthening today to which she responded well. Pt completed exercises slow with cautious movements. She noted that the taping last session helped but didn't last very long. She cont to have a good response to estim and heat noting that it makes her pain better at the end of the session.    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; TIA    PT Frequency 2x / week    PT Duration 8 weeks    PT Treatment/Interventions ADLs/Self Care Home Management;Cryotherapy;Electrical Stimulation;Iontophoresis 4mg /ml Dexamethasone;Moist Heat;Traction;Ultrasound;Gait training;Stair training;Functional mobility training;Therapeutic activities;Therapeutic exercise;Balance training;Neuromuscular re-education;Patient/family education;Manual techniques;Passive range of motion;Dry needling;Taping;Spinal Manipulations    PT Next Visit Plan STM/DTM to hip PRN; Hip flxibility; posture and body mechanics training; lumbopelvic flexibilty & strengthening; review HEP & update as indicated; manual therapy and modalities as indicated and benefit noted     PT Home Exercise Plan MedBridge Access Code: [email or web address] (3/15), update (3/23)- Access Code: [email or web address]    Consulted and Agree with Plan of Care Patient           Patient will benefit from skilled therapeutic intervention in order to improve the following deficits and impairments:  Decreased activity tolerance,Decreased endurance,Decreased knowledge of precautions,Decreased mobility,Decreased range of motion,Decreased safety awareness,Decreased strength,Difficulty walking,Hypomobility,Increased fascial  restricitons,Increased muscle spasms,Impaired perceived functional ability,Impaired flexibility,Impaired sensation,Improper body mechanics,Postural dysfunction,Pain  Visit Diagnosis: Chronic bilateral low back pain with bilateral sciatica  Radiculopathy, lumbar region  Muscle spasm of back  Muscle weakness (generalized)     Problem List Patient Active Problem List   Diagnosis Date Noted  . 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 Chronic daily headache 08/17/2016  . Chronic pelvic pain in female 06/27/2016  . Bilateral mastodynia 06/27/2016  . Galactorrhea, bilateral 06/27/2016  . Low back pain radiating to both legs 12/29/2015  . Convulsions (HCC) 06/08/2015  . Generalized anxiety disorder 06/08/2015  . Chronic pain syndrome 06/08/2015  . Migraine without aura and without status migrainosus, not intractable 06/08/2015  . Nonintractable epilepsy with status epilepticus (HCC) 05/28/2015  . Seizure disorder (HCC) 05/28/2015  . Depression 05/28/2015  . Obstructive sleep apnea 05/28/2015  . Tobacco dependence 05/28/2015  . Neuropathy     Darleene Cleaver, PTA 05/29/2020, 9:14 AM  [facility] [address]  [address] [place], [place], [address] Phone: [phone]   Fax:  [phone]  Name: Angela Doyle MRN: [id number] Date of Birth: January 06, 1986

## 2020-06-01 ENCOUNTER — Ambulatory Visit: Payer: Medicaid Other | Admitting: Physical Therapy

## 2020-06-01 ENCOUNTER — Other Ambulatory Visit: Payer: Self-pay

## 2020-06-01 ENCOUNTER — Encounter: Payer: Self-pay | Admitting: Physical Therapy

## 2020-06-01 DIAGNOSIS — M6281 Muscle weakness (generalized): Secondary | ICD-10-CM

## 2020-06-01 DIAGNOSIS — M62830 Muscle spasm of back: Secondary | ICD-10-CM

## 2020-06-01 DIAGNOSIS — M5416 Radiculopathy, lumbar region: Secondary | ICD-10-CM

## 2020-06-01 DIAGNOSIS — M5442 Lumbago with sciatica, left side: Secondary | ICD-10-CM | POA: Diagnosis not present

## 2020-06-01 DIAGNOSIS — G8929 Other chronic pain: Secondary | ICD-10-CM

## 2020-06-01 NOTE — Therapy (Signed)
[facility] [address]  [address] [place], [place], [address] Phone: [phone]   Fax:  [phone]  Physical Therapy Treatment / Progress Note  Patient Details  Name: Angela Doyle MRN: [id number] Date of Birth: Dec 19, 1985 Referring Provider (PT): Carmel Sacramento, NP   Encounter Date: 06/01/2020   PT End of Session - 06/01/20 1452    Visit Number 7    Number of Visits 16    Date for PT Re-Evaluation 06/24/20    Authorization Type [employer] Medicaid - VL: 27    PT Start Time 1452    PT Stop Time 1602    PT Time Calculation (min) 70 min    Activity Tolerance Patient limited by pain;Patient tolerated treatment well    Behavior During Therapy [facility] for tasks assessed/performed           Past Medical History:  Diagnosis Date  . Chronic back pain   . Depression   . Fibromyalgia   . Headache    migraine  . Hypercholesterolemia   . Neuropathy   . Osteoarthritis   . 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 Seizure (HCC)    pseudo seizures  . TIA (transient ischemic attack)   . Vertigo     Past Surgical History:  Procedure Laterality Date  . CESAREAN SECTION  2011  . OVARIAN CYST SURGERY      There were no vitals filed for this visit.   Subjective Assessment - 06/01/20 1453    Subjective Pt reports walking remains very limited as it continues to be a primary trigger for her pain. Also notes increased pain with sit <> stand transitions.    Limitations Sitting;Standing;Walking    How long can you sit comfortably? 5-10 minutes    How long can you stand comfortably? 10 minutes    How long can you walk comfortably? 5-10 minutes    Diagnostic tests Lumbar MRI 04/18/18: 1. No lumbar disc degeneration or acute osseous abnormality.  2. Widespread lumbar facet hypertrophy. No lumbar spinal stenosis,  but up to mild bilateral neural foraminal stenosis at the L3 through  L5 nerve levels.    Patient Stated Goals "to strengthen my legs and to alleviate  some pain in my back so I can walk and stand longer"    Currently in Pain? Yes    Pain Score 5     Pain Location Back   & R hip   Pain Orientation Right    Pain Descriptors / Indicators Sharp    Pain Type Acute pain    Aggravating Factors  walking, sit <> stand transitions, rolling over on R side    Pain Relieving Factors heating pad and TENS, stretching              [facility] PT Assessment - 06/01/20 1452      Assessment   Medical Diagnosis Lumbar radiculopathy    Referring Provider (PT) Carmel Sacramento, NP    Next MD Visit 06/02/20      Strength   Right Hip Flexion 4-/5    Right Hip Extension 2+/5   very limited ROM   Right Hip External Rotation  4/5   pain   Right Hip Internal Rotation 4/5    Right Hip ABduction 4/5    Right Hip ADduction 3-/5    Left Hip Flexion 4/5    Left Hip Extension 2+/5   very limited ROM   Left Hip External Rotation 4+/5    Left Hip  Internal Rotation 4+/5    Left Hip ABduction 4+/5    Left Hip ADduction 3-/5    Right Knee Flexion 4-/5    Right Knee Extension 4/5    Left Knee Flexion 4/5    Left Knee Extension 4+/5    Right Ankle Dorsiflexion 5/5    Right Ankle Plantar Flexion 3+/5   6SLS heel raises with limited lift   Left Ankle Dorsiflexion 5/5    Left Ankle Plantar Flexion 4/5   10 SLS heel raises                        [facility] Adult PT Treatment/Exercise - 06/01/20 1452      Exercises   Exercises Lumbar      Lumbar Exercises: Stretches   Passive Hamstring Stretch Right;2 reps;30 seconds    Passive Hamstring Stretch Limitations seated hip hinge with foot on floor    Single Knee to Chest Stretch Right;2 reps;30 seconds    Single Knee to Chest Stretch Limitations seated hip hinge    Lower Trunk Rotation Limitations attempted but deferred d/t increased pain    Piriformis Stretch Right;2 reps;30 seconds    Piriformis Stretch Limitations seated KTOS    Figure 4 Stretch 2 reps;30 seconds;Seated    Figure 4 Stretch Limitations  side-sitting figure-4    Other Lumbar Stretch Exercise R QL stretch in L side-lying with lower leg(s) over side of plinth 2 x 30 sec - pt noting better tolerance for single leg    Other Lumbar Stretch Exercise R open book stretch 5 x 5 sec      Lumbar Exercises: Aerobic   Nustep L3 x 6 min      Modalities   Modalities Iontophoresis;Electrical Stimulation;Moist Heat      Moist Heat Therapy   Number Minutes Moist Heat 15 Minutes    Moist Heat Location Lumbar Spine      Electrical Stimulation   Electrical Stimulation Location Lumbar paraspinals & upper glutes    Electrical Stimulation Action IFC    Electrical Stimulation Parameters 80-150Hz  intensity to pt tolerance x 15'    Electrical Stimulation Goals Pain;Tone      Iontophoresis   Type of Iontophoresis Dexamethasone    Location R greater trochanter    Dose 80 mA-min, 1.0 mL    Time 4-6 hr patch (#1 of 6)                  PT Education - 06/01/20 1602    Education Details HEP update - Access Code: [email or web address]; Ionto patch wearing instructions; Role of DN    Person(s) Educated Patient    Methods Explanation;Demonstration;Verbal cues;Handout    Comprehension Verbalized understanding;Verbal cues required;Returned demonstration;Need further instruction            PT Short Term Goals - 06/01/20 1502      PT SHORT TERM GOAL #1   Title Patient will be independent with initial HEP    Status Achieved   06/01/20   Target Date 05/20/20      PT SHORT TERM GOAL #2   Title Patient will verbalize/demonstrate understanding of neutral spine posture and proper body mechanics to reduce strain on lumbar spine    Status On-going    Target Date 05/27/20             PT Long Term Goals - 06/01/20 1504      PT LONG TERM GOAL #1   Title Patient will be independent  with ongoing/advanced HEP for self-management at home    Status On-going    Target Date 06/24/20      PT LONG TERM GOAL #2   Title Patient to demonstrate appropriate  posture and body mechanics needed for daily activities    Status On-going    Target Date 06/24/20      PT LONG TERM GOAL #3   Title Patient to report reduction in frequency and intensity of LBP and LE radicular pain by >/= 25%    Status On-going    Target Date 06/24/20      PT LONG TERM GOAL #4   Title Patient will demonstrate improved B LE strength to >/= 4+/5 for improved stability and ease of mobility    Status On-going    Target Date 06/24/20      PT LONG TERM GOAL #5   Title Patient will improve standing and/or walking tolerance to >/=  20-30 minutes w/o pain interference to allow resumption of normal daily activities    Status On-going    Target Date 06/24/20                 Plan - 06/01/20 1547    Clinical Impression Statement Shailah reports pain remains moderate in low back and R hip and continues to limit walking and standing tolerance as well as transitional movements. She notes benefit from stretches as well as modalities but notes limited tolerance for manual therapy. She reports good understanding of HEP but has some difficulty with tolerating laying on the floor for stretches, therefore instructions provided in alterative stretching option from sitting position as well as added new stretches which can be performed in sidelying on edge of bed to address QL & ITB tightness/restriction which is likely contributing to her lateral hip pain. Also initiated trial of ionto patch to reduce inflammation at R greater trochanter as she remains very TTP here. Overall LE strength gradually improving but significant weakness still evident, most pronounced in B glutes. Jazmarie is slowly progressing toward goals but will continue to benefit from skilled PT to further manage pain and promoted improved lumbopelvic flexibility and strength for increased activity tolerance.    Personal Factors and Comorbidities Time since onset of injury/illness/exacerbation;Past/Current  Experience;Fitness;Comorbidity 3+    Comorbidities UE/LE peripheral neuropathy; fibromyalgia; chronic pain syndrome; OA; migraines, OSA, anxiety; conversion disorder with seizures/pseudoseizures or convulsions; TIA    Examination-Activity Limitations Bathing;Bed Mobility;Bend;Caring for Others;Carry;Dressing;Hygiene/Grooming;Lift;Locomotion Level;Reach Overhead;Sit;Sleep;Squat;Stairs;Stand;Toileting;Transfers    Examination-Participation Restrictions Cleaning;Community Activity;Driving;Laundry;Meal Prep;Occupation;Shop    Rehab Potential Good    PT Frequency 2x / week    PT Duration 8 weeks    PT Treatment/Interventions ADLs/Self Care Home Management;Cryotherapy;Electrical Stimulation;Iontophoresis 4mg /ml Dexamethasone;Moist Heat;Traction;Ultrasound;Gait training;Stair training;Functional mobility training;Therapeutic activities;Therapeutic exercise;Balance training;Neuromuscular re-education;Patient/family education;Manual techniques;Passive range of motion;Dry needling;Taping;Spinal Manipulations    PT Next Visit Plan assess response to ionto patch; posture and body mechanics training to reduce lumbar strain; lumbopelvic/hip flexibilty & strengthening; review HEP & update as indicated; manual therapy including STM/DTM and DN as indicated to lumbar paraspinals and hip/buttock; modalities as indicated and benefit noted    PT Home Exercise Plan MedBridge Access Codes: [id number] 05/24/2022), 02-14-1970 (3/23);  [email or web address] (4/4)    Consulted and Agree with Plan of Care Patient           Patient will benefit from skilled therapeutic intervention in order to improve the following deficits and impairments:  Decreased activity tolerance,Decreased endurance,Decreased knowledge of precautions,Decreased mobility,Decreased range of motion,Decreased safety awareness,Decreased strength,Difficulty walking,Hypomobility,Increased fascial restricitons,Increased muscle spasms,Impaired  perceived functional ability,Impaired  flexibility,Impaired sensation,Improper body mechanics,Postural dysfunction,Pain  Visit Diagnosis: Chronic bilateral low back pain with bilateral sciatica  Radiculopathy, lumbar region  Muscle spasm of back  Muscle weakness (generalized)     Problem List Patient Active Problem List   Diagnosis Date Noted  . 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 Chronic daily headache 08/17/2016  . Chronic pelvic pain in female 06/27/2016  . Bilateral mastodynia 06/27/2016  . Galactorrhea, bilateral 06/27/2016  . Low back pain radiating to both legs 12/29/2015  . Convulsions (HCC) 06/08/2015  . Generalized anxiety disorder 06/08/2015  . Chronic pain syndrome 06/08/2015  . Migraine without aura and without status migrainosus, not intractable 06/08/2015  . Nonintractable epilepsy with status epilepticus (HCC) 05/28/2015  . Seizure disorder (HCC) 05/28/2015  . Depression 05/28/2015  . Obstructive sleep apnea 05/28/2015  . Tobacco dependence 05/28/2015  . Neuropathy     Marry Guan, PT, MPT 06/01/2020, 4:46 PM  [facility] [address]  [address] [place], [place], [address] Phone: [phone]   Fax:  [phone]  Name: Tylynn Braniff MRN: [id number] Date of Birth: 10/17/1985

## 2020-06-01 NOTE — Patient Instructions (Addendum)
Trigger Point Dry Needling  . What is Trigger Point Dry Needling (DN)? o DN is a physical therapy technique used to treat muscle pain and dysfunction. Specifically, DN helps deactivate muscle trigger points (muscle knots).  o A thin filiform needle is used to penetrate the skin and stimulate the underlying trigger point. The goal is for a local twitch response (LTR) to occur and for the trigger point to relax. No medication of any kind is injected during the procedure.   . What Does Trigger Point Dry Needling Feel Like?  o The procedure feels different for each individual patient. Some patients report that they do not actually feel the needle enter the skin and overall the process is not painful. Very mild bleeding may occur. However, many patients feel a deep cramping in the muscle in which the needle was inserted. This is the local twitch response.   Marland Kitchen How Will I feel after the treatment? o Soreness is normal, and the onset of soreness may not occur for a few hours. Typically this soreness does not last longer than two days.  o Bruising is uncommon, however; ice can be used to decrease any possible bruising.  o In rare cases feeling tired or nauseous after the treatment is normal. In addition, your symptoms may get worse before they get better, this period will typically not last longer than 24 hours.   . What Can I do After My Treatment? o Increase your hydration by drinking more water for the next 24 hours. o You may place ice or heat on the areas treated that have become sore, however, do not use heat on inflamed or bruised areas. Heat often brings more relief post needling. o You can continue your regular activities, but vigorous activity is not recommended initially after the treatment for 24 hours. o DN is best combined with other physical therapy such as strengthening, stretching, and other therapies.      IONTOPHORESIS PATIENT PRECAUTIONS & CONTRAINDICATIONS:  . Redness under one  or both electrodes can occur.  This characterized by a uniform redness that usually disappears within 12 hours of treatment. . Small pinhead size blisters may result in response to the drug.  Contact your physician if the problem persists more than 24 hours. . On rare occasions, iontophoresis therapy can result in temporary skin reactions such as rash, inflammation, irritation or burns.  The skin reactions may be the result of individual sensitivity to the ionic solution used, the condition of the skin at the start of treatment, reaction to the materials in the electrodes, allergies or sensitivity to dexamethasone, or a poor connection between the patch and your skin.  Discontinue using iontophoresis if you have any of these reactions and report to your therapist. . Remove the Patch or electrodes if you have any undue sensation of pain or burning during the treatment and report discomfort to your therapist. . Tell your Therapist if you have had known adverse reactions to the application of electrical current. Marland Kitchen Approximate treatment time is 4-6 hours.  Remove the patch after 6 hours. . The Patch can be worn during normal activity, however excessive motion where the electrodes have been placed can cause poor contact between the skin and the electrode or uneven electrical current resulting in greater risk of skin irritation. Marland Kitchen Keep out of the reach of children.   . DO NOT use if you have a cardiac pacemaker or any other electrically sensitive implanted device. . DO NOT use if you have  a known sensitivity to dexamethasone. . DO NOT use during Magnetic Resonance Imaging (MRI). . DO NOT use over broken or compromised skin (e.g. sunburn, cuts, or acne) due to the increased risk of skin reaction. . DO NOT SHAVE over the area to be treated:  To establish good contact between the Patch and the skin, excessive hair may be clipped. . DO NOT place the Patch or electrodes on or over your eyes, directly over  your heart, or brain. . DO NOT reuse the Patch or electrodes as this may cause burns to occur.   For questions, please contact your therapist at:  [facility] [address]  [address] [place], [place], [address] Phone: [phone]   Fax:  [phone]         Access Code: [email or web address] URL: [email or web address] Date: 06/01/2020 Prepared by: Glenetta Hew  Exercises Seated Table Piriformis Stretch - 2-3 x daily - 7 x weekly - 3 reps - 30 sec hold Seated Hamstring Stretch - 2-3 x daily - 7 x weekly - 3 reps - 30 sec hold Sidelying Thoracic Lumbar Rotation - 1 x daily - 7 x weekly - 2 sets - 10 reps - 3 hold Sidelying Quadratus Lumborum Stretch on Table - 1 x daily - 7 x weekly - 3 sets - 30 seconds hold Seated Piriformis Stretch - 1 x daily - 7 x weekly - 2-3 sets - 1 reps - 30 seconds hold  Patient Education Trigger Point Dry Needling Ionto Patient Instructions

## 2020-06-10 ENCOUNTER — Other Ambulatory Visit: Payer: Self-pay

## 2020-06-10 ENCOUNTER — Ambulatory Visit: Payer: Medicaid Other

## 2020-06-10 DIAGNOSIS — G8929 Other chronic pain: Secondary | ICD-10-CM

## 2020-06-10 DIAGNOSIS — M5416 Radiculopathy, lumbar region: Secondary | ICD-10-CM

## 2020-06-10 DIAGNOSIS — M5442 Lumbago with sciatica, left side: Secondary | ICD-10-CM

## 2020-06-10 DIAGNOSIS — M62830 Muscle spasm of back: Secondary | ICD-10-CM

## 2020-06-10 DIAGNOSIS — M6281 Muscle weakness (generalized): Secondary | ICD-10-CM

## 2020-06-10 NOTE — Therapy (Signed)
[facility] [address]  [address] [place], [place], [address] Phone: [phone]   Fax:  [phone]  Physical Therapy Treatment  Patient Details  Name: Angela Doyle MRN: [id number] Date of Birth: 1986/02/12 Referring Provider (PT): Carmel Sacramento, NP   Encounter Date: 06/10/2020   PT End of Session - 06/10/20 1023    Visit Number 8    Number of Visits 16    Date for PT Re-Evaluation 06/24/20    Authorization Type U[employer] Medicaid - VL: 27    Progress Note Due on Visit 16    PT Start Time 0934    PT Stop Time 1034    PT Time Calculation (min) 60 min    Activity Tolerance Patient limited by pain;Patient tolerated treatment well    Behavior During Therapy [facility] for tasks assessed/performed           Past Medical History:  Diagnosis Date  . Chronic back pain   . Depression   . Fibromyalgia   . Headache    migraine  . Hypercholesterolemia   . Neuropathy   . Osteoarthritis   . 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 Seizure (HCC)    pseudo seizures  . TIA (transient ischemic attack)   . Vertigo     Past Surgical History:  Procedure Laterality Date  . CESAREAN SECTION  2011  . OVARIAN CYST SURGERY      There were no vitals filed for this visit.   Subjective Assessment - 06/10/20 0936    Subjective Pt still reports pain in R hip also along sciatic nerve. Ionto patch really helped.    Diagnostic tests Lumbar MRI 04/18/18: 1. No lumbar disc degeneration or acute osseous abnormality.  2. Widespread lumbar facet hypertrophy. No lumbar spinal stenosis,  but up to mild bilateral neural foraminal stenosis at the L3 through  L5 nerve levels.    Patient Stated Goals "to strengthen my legs and to alleviate some pain in my back so I can walk and stand longer"    Currently in Pain? Yes    Pain Score 5     Pain Location Hip    Pain Orientation Right    Pain Descriptors / Indicators Sharp;Dull    Pain Type Acute pain    Pain Radiating  Towards all the way down R LE                             [facility] Adult PT Treatment/Exercise - 06/10/20 0001      Exercises   Exercises Lumbar      Lumbar Exercises: Stretches   Passive Hamstring Stretch Right;2 reps;30 seconds    Passive Hamstring Stretch Limitations supine with strap    ITB Stretch Right;30 seconds;3 reps    ITB Stretch Limitations 1 set supine with strap; 2 sets manual stretch    Piriformis Stretch Right;2 reps;30 seconds    Piriformis Stretch Limitations seated KTOS    Other Lumbar Stretch Exercise R open book 10x5"      Lumbar Exercises: Aerobic   Recumbent Bike L2x99min      Lumbar Exercises: Standing   Other Standing Lumbar Exercises TRX mini squats 10 reps cues for equal weight shift      Lumbar Exercises: Seated   Sit to Stand 10 reps   good ant weight shift     Lumbar Exercises: Quadruped   Madcat/Old Horse 10 reps    Madcat/Old  Horse Limitations 5" hold    Other Quadruped Lumbar Exercises R side bend stretch in child pose position 5x10"      Modalities   Modalities Iontophoresis;Electrical Stimulation;Moist Heat      Moist Heat Therapy   Number Minutes Moist Heat 15 Minutes    Moist Heat Location Lumbar Spine      Electrical Stimulation   Electrical Stimulation Location Lumbar paraspinals & upper glutes    Electrical Stimulation Action IFC    Electrical Stimulation Parameters 80-150Hz  intensity to pt tolerance    Electrical Stimulation Goals Pain;Tone      Iontophoresis   Type of Iontophoresis Dexamethasone    Location R greater trochanter    Dose 80 mA-min, 1.0 mL    Time 4-6 hr patch (#2 of 6)                    PT Short Term Goals - 06/01/20 1502      PT SHORT TERM GOAL #1   Title Patient will be independent with initial HEP    Status Achieved   06/01/20   Target Date 05/20/20      PT SHORT TERM GOAL #2   Title Patient will verbalize/demonstrate understanding of neutral spine posture and proper body  mechanics to reduce strain on lumbar spine    Status On-going    Target Date 05/27/20             PT Long Term Goals - 06/01/20 1504      PT LONG TERM GOAL #1   Title Patient will be independent with ongoing/advanced HEP for self-management at home    Status On-going    Target Date 06/24/20      PT LONG TERM GOAL #2   Title Patient to demonstrate appropriate posture and body mechanics needed for daily activities    Status On-going    Target Date 06/24/20      PT LONG TERM GOAL #3   Title Patient to report reduction in frequency and intensity of LBP and LE radicular pain by >/= 25%    Status On-going    Target Date 06/24/20      PT LONG TERM GOAL #4   Title Patient will demonstrate improved B LE strength to >/= 4+/5 for improved stability and ease of mobility    Status On-going    Target Date 06/24/20      PT LONG TERM GOAL #5   Title Patient will improve standing and/or walking tolerance to >/=  20-30 minutes w/o pain interference to allow resumption of normal daily activities    Status On-going    Target Date 06/24/20                 Plan - 06/10/20 1023    Clinical Impression Statement Pt has been doing ok. She has had difficulty with standing for prolonged periods of time and showed some difficulty with sit to stand movements. She moves cautiously and gingerly out of fear of flaring up her back pain and R hip pain. Practiced STS and TRX squats to increased her tolerance to WB, she responded well. Continued with QL, ITB, and low back stretches, she noted that all exercises caused some discomfort in the R hip but also that it was tolerable. She continues to respond well to estim and ionto noting pain relief from these.    Personal Factors and Comorbidities Time since onset of injury/illness/exacerbation;Past/Current Experience;Fitness;Comorbidity 3+    Comorbidities UE/LE peripheral neuropathy; fibromyalgia;  chronic pain syndrome; OA; migraines, OSA, anxiety;  conversion disorder with seizures/pseudoseizures or convulsions; TIA    PT Frequency 2x / week    PT Duration 8 weeks    PT Treatment/Interventions ADLs/Self Care Home Management;Cryotherapy;Electrical Stimulation;Iontophoresis 4mg /ml Dexamethasone;Moist Heat;Traction;Ultrasound;Gait training;Stair training;Functional mobility training;Therapeutic activities;Therapeutic exercise;Balance training;Neuromuscular re-education;Patient/family education;Manual techniques;Passive range of motion;Dry needling;Taping;Spinal Manipulations    PT Next Visit Plan progress tolerance to standing; posture and body mechanics training to reduce lumbar strain; lumbopelvic/hip flexibilty & strengthening; review HEP & update as indicated; manual therapy including STM/DTM and DN as indicated to lumbar paraspinals and hip/buttock; modalities as indicated and benefit noted    PT Home Exercise Plan MedBridge Access Codes: [id number] 2022-05-20), 02-14-1970 (3/23);  [id number] (4/4)    Consulted and Agree with Plan of Care Patient           Patient will benefit from skilled therapeutic intervention in order to improve the following deficits and impairments:  Decreased activity tolerance,Decreased endurance,Decreased knowledge of precautions,Decreased mobility,Decreased range of motion,Decreased safety awareness,Decreased strength,Difficulty walking,Hypomobility,Increased fascial restricitons,Increased muscle spasms,Impaired perceived functional ability,Impaired flexibility,Impaired sensation,Improper body mechanics,Postural dysfunction,Pain  Visit Diagnosis: Chronic bilateral low back pain with bilateral sciatica  Radiculopathy, lumbar region  Muscle spasm of back  Muscle weakness (generalized)     Problem List Patient Active Problem List   Diagnosis Date Noted  . 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 Chronic daily headache 08/17/2016  . Chronic pelvic pain in female 06/27/2016  . Bilateral  mastodynia 06/27/2016  . Galactorrhea, bilateral 06/27/2016  . Low back pain radiating to both legs 12/29/2015  . Convulsions (HCC) 06/08/2015  . Generalized anxiety disorder 06/08/2015  . Chronic pain syndrome 06/08/2015  . Migraine without aura and without status migrainosus, not intractable 06/08/2015  . Nonintractable epilepsy with status epilepticus (HCC) 05/28/2015  . Seizure disorder (HCC) 05/28/2015  . Depression 05/28/2015  . Obstructive sleep apnea 05/28/2015  . Tobacco dependence 05/28/2015  . Neuropathy     05/30/2015, PTA 06/10/2020, 10:53 AM  [facility] [address]  [address] [place], [place], [place] Phone: [phone]   Fax:  [phone]  Name: Angela Doyle MRN: [name] Date of Birth: [birth year]

## 2020-06-16 ENCOUNTER — Ambulatory Visit: Payer: Medicaid Other | Admitting: Physical Therapy

## 2020-06-19 ENCOUNTER — Ambulatory Visit: Payer: Medicaid Other

## 2020-06-22 ENCOUNTER — Other Ambulatory Visit: Payer: Self-pay

## 2020-06-22 ENCOUNTER — Ambulatory Visit: Payer: Medicaid Other

## 2020-06-22 DIAGNOSIS — M62830 Muscle spasm of back: Secondary | ICD-10-CM

## 2020-06-22 DIAGNOSIS — M5442 Lumbago with sciatica, left side: Secondary | ICD-10-CM | POA: Diagnosis not present

## 2020-06-22 DIAGNOSIS — G8929 Other chronic pain: Secondary | ICD-10-CM

## 2020-06-22 DIAGNOSIS — M6281 Muscle weakness (generalized): Secondary | ICD-10-CM

## 2020-06-22 DIAGNOSIS — M5416 Radiculopathy, lumbar region: Secondary | ICD-10-CM

## 2020-06-22 NOTE — Therapy (Addendum)
[facility] [address]  [address] [place], [place], [address] Phone: [phone]   Fax:  [phone]  Physical Therapy Treatment  Patient Details  Name: Angela Doyle MRN: [id number] Date of Birth: Aug 04, 1985 Referring Provider (PT): Carmel Sacramento, NP   Encounter Date: 06/22/2020   PT End of Session - 06/22/20 1026    Visit Number 9    Number of Visits 16    Date for PT Re-Evaluation 06/24/20    Authorization Type UHC Medicaid - VL: 27    Progress Note Due on Visit 16    PT Start Time 0936    PT Stop Time 1021    PT Time Calculation (min) 45 min    Activity Tolerance Patient limited by pain;Patient tolerated treatment well    Behavior During Therapy [facility] for tasks assessed/performed           Past Medical History:  Diagnosis Date  . Chronic back pain   . Depression   . Fibromyalgia   . Headache    migraine  . Hypercholesterolemia   . Neuropathy   . Osteoarthritis   . 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 Seizure (HCC)    pseudo seizures  . TIA (transient ischemic attack)   . Vertigo     Past Surgical History:  Procedure Laterality Date  . CESAREAN SECTION  2011  . OVARIAN CYST SURGERY      There were no vitals filed for this visit.   Subjective Assessment - 06/22/20 0939    Subjective Pt reports flare ups in low back, hard to get around majority of the time.    Diagnostic tests Lumbar MRI 04/18/18: 1. No lumbar disc degeneration or acute osseous abnormality.  2. Widespread lumbar facet hypertrophy. No lumbar spinal stenosis,  but up to mild bilateral neural foraminal stenosis at the L3 through  L5 nerve levels.    Patient Stated Goals "to strengthen my legs and to alleviate some pain in my back so I can walk and stand longer"    Currently in Pain? Yes    Pain Score 6     Pain Location Hip    Pain Orientation Right    Pain Descriptors / Indicators Burning    Pain Type Acute pain    Pain Radiating Towards down the R  LE              [facility] PT Assessment - 06/22/20 0001      Strength   Right Hip Flexion 4/5    Right Hip External Rotation  4/5   pain   Right Hip Internal Rotation 4/5   pain   Right Hip ABduction 4/5    Left Hip Flexion 5/5    Right Knee Flexion 4-/5    Right Knee Extension 4/5    Left Knee Flexion 4+/5    Left Knee Extension 4+/5                         [facility] Adult PT Treatment/Exercise - 06/22/20 0001      Exercises   Exercises Lumbar      Lumbar Exercises: Stretches   ITB Stretch Right;2 reps;30 seconds    ITB Stretch Limitations supine with strap    Piriformis Stretch Right;2 reps;30 seconds    Piriformis Stretch Limitations supine KTOS      Lumbar Exercises: Aerobic   Recumbent Bike L2x57min      Lumbar Exercises: Supine  Bridge Compliant;10 reps    Bridge Limitations mild discomfort on R side      Lumbar Exercises: Sidelying   Other Sidelying Lumbar Exercises open book R side 10x5"      Iontophoresis   Type of Iontophoresis Dexamethasone    Location R greater trochanter    Dose 80 mA-min, 1.0 mL    Time 4-6 hr patch (#3 of 6)                    PT Short Term Goals - 06/22/20 1151      PT SHORT TERM GOAL #1   Title Patient will be independent with initial HEP    Status Achieved   06/01/20   Target Date 05/20/20      PT SHORT TERM GOAL #2   Title Patient will verbalize/demonstrate understanding of neutral spine posture and proper body mechanics to reduce strain on lumbar spine    Status Achieved    Target Date 05/27/20             PT Long Term Goals - 06/22/20 1151      PT LONG TERM GOAL #1   Title Patient will be independent with ongoing/advanced HEP for self-management at home    Status On-going      PT LONG TERM GOAL #2   Title Patient to demonstrate appropriate posture and body mechanics needed for daily activities    Status Achieved      PT LONG TERM GOAL #3   Title Patient to report reduction in frequency  and intensity of LBP and LE radicular pain by >/= 25%    Status On-going      PT LONG TERM GOAL #4   Title Patient will demonstrate improved B LE strength to >/= 4+/5 for improved stability and ease of mobility    Status On-going      PT LONG TERM GOAL #5   Title Patient will improve standing and/or walking tolerance to >/=  20-30 minutes w/o pain interference to allow resumption of normal daily activities    Status On-going                 Plan - 06/22/20 1029    Clinical Impression Statement Pt reported relief from R QL stretching and R hip stretching. She has been having flare ups with pain and decreased standing tolerance d/t her fibromyalgia. Continued to focus on gentle stretching a light exercises to avoid exaccerbating symptoms. Strength findings suggest that pt is still weak in most hip muscles showing 4/5 in most muscles. She is more aware of posture as well as body mechanics as observed during session and seems to keep good posture at rest and during activity. 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; TIA    PT Frequency 2x / week    PT Duration 8 weeks    PT Treatment/Interventions ADLs/Self Care Home Management;Cryotherapy;Electrical Stimulation;Iontophoresis 4mg /ml Dexamethasone;Moist Heat;Traction;Ultrasound;Gait training;Stair training;Functional mobility training;Therapeutic activities;Therapeutic exercise;Balance training;Neuromuscular re-education;Patient/family education;Manual techniques;Passive range of motion;Dry needling;Taping;Spinal Manipulations    PT Next Visit Plan progress tolerance to standing;  posture and body mechanics training to reduce lumbar strain; lumbopelvic/hip flexibilty & strengthening; review HEP & update as indicated; manual therapy including STM/DTM and  DN as indicated to lumbar paraspinals and hip/buttock; modalities as indicated and benefit noted    PT Home Exercise Plan MedBridge Access Codes: [id number] 05/16/2022), [id number] (3/23);  [id number] (4/4)    Consulted and Agree with Plan of Care Patient           Patient will benefit from skilled therapeutic intervention in order to improve the following deficits and impairments:  Decreased activity tolerance,Decreased endurance,Decreased knowledge of precautions,Decreased mobility,Decreased range of motion,Decreased safety awareness,Decreased strength,Difficulty walking,Hypomobility,Increased fascial restricitons,Increased muscle spasms,Impaired perceived functional ability,Impaired flexibility,Impaired sensation,Improper body mechanics,Postural dysfunction,Pain  Visit Diagnosis: Chronic bilateral low back pain with bilateral sciatica  Radiculopathy, lumbar region  Muscle spasm of back  Muscle weakness (generalized)     Problem List Patient Active Problem List   Diagnosis Date Noted  . 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 Chronic daily headache 08/17/2016  . Chronic pelvic pain in female 06/27/2016  . Bilateral mastodynia 06/27/2016  . Galactorrhea, bilateral 06/27/2016  . Low back pain radiating to both legs 12/29/2015  . Convulsions (HCC) 06/08/2015  . Generalized anxiety disorder 06/08/2015  . Chronic pain syndrome 06/08/2015  . Migraine without aura and without status migrainosus, not intractable 06/08/2015  . Nonintractable epilepsy with status epilepticus (HCC) 05/28/2015  . Seizure disorder (HCC) 05/28/2015  . Depression 05/28/2015  . Obstructive sleep apnea 05/28/2015  . Tobacco dependence 05/28/2015  . Neuropathy     Darleene Cleaver, PTA 06/22/2020, 11:53 AM  [facility] [address]  [address] [place], [place], [address] Phone: [phone]   Fax:  [phone]  Name: Tenicia Gural MRN: [id number] Date of Birth: 07-09-1985

## 2020-06-24 ENCOUNTER — Other Ambulatory Visit: Payer: Self-pay

## 2020-06-24 ENCOUNTER — Encounter: Payer: Self-pay | Admitting: Physical Therapy

## 2020-06-24 ENCOUNTER — Ambulatory Visit: Payer: Medicaid Other | Admitting: Physical Therapy

## 2020-06-24 DIAGNOSIS — M5416 Radiculopathy, lumbar region: Secondary | ICD-10-CM

## 2020-06-24 DIAGNOSIS — M5442 Lumbago with sciatica, left side: Secondary | ICD-10-CM

## 2020-06-24 DIAGNOSIS — G8929 Other chronic pain: Secondary | ICD-10-CM

## 2020-06-24 DIAGNOSIS — M6281 Muscle weakness (generalized): Secondary | ICD-10-CM

## 2020-06-24 DIAGNOSIS — M62830 Muscle spasm of back: Secondary | ICD-10-CM

## 2020-06-24 NOTE — Therapy (Signed)
[facility] [address]  [address] [place], [place], [address] Phone: [phone]   Fax:  [phone]  Physical Therapy Treatment / Recert  Patient Details  Name: Angela Doyle MRN: [id number] Date of Birth: 1985/09/12 Referring Provider (PT): Carmel Sacramento, NP  Progress Note  Reporting Period 04/29/2020 to 06/24/2020  See note below for Objective Data and Assessment of Progress/Goals.      Encounter Date: 06/24/2020   PT End of Session - 06/24/20 0935    Visit Number 10    Number of Visits 18    Date for PT Re-Evaluation 07/22/20    Authorization Type UHC Medicaid - VL: 27    Progress Note Due on Visit --   End of POC - Recert on visit #10   PT Start Time 0935    PT Stop Time 1038    PT Time Calculation (min) 63 min    Activity Tolerance Patient limited by pain;Patient tolerated treatment well    Behavior During Therapy [facility] for tasks assessed/performed           Past Medical History:  Diagnosis Date  . Chronic back pain   . Depression   . Fibromyalgia   . Headache    migraine  . Hypercholesterolemia   . Neuropathy   . Osteoarthritis   . 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 Seizure (HCC)    pseudo seizures  . TIA (transient ischemic attack)   . Vertigo    Past Surgical History:  Procedure Laterality Date  . CESAREAN SECTION  2011  . OVARIAN CYST SURGERY      There were no vitals filed for this visit.   Subjective Assessment - 06/24/20 0939    Subjective Pt reports that PT has helped but her R side (low back, hip and leg) is still giving her a problem.    Limitations Sitting;Standing;Walking;House hold activities;Lifting    How long can you sit comfortably? 5-10 minutes    How long can you stand comfortably? 5 minutes    How long can you walk comfortably? 5 minutes    Diagnostic tests Lumbar MRI 04/18/18: 1. No lumbar disc degeneration or acute osseous abnormality.  2. Widespread lumbar facet hypertrophy. No  lumbar spinal stenosis,  but up to mild bilateral neural foraminal stenosis at the L3 through  L5 nerve levels.    Patient Stated Goals "to strengthen my legs and to alleviate some pain in my back so I can walk and stand longer"    Currently in Pain? Yes    Pain Score 5     Pain Location Back    Pain Orientation Right    Pain Descriptors / Indicators Sharp;Aching;Dull    Pain Type Acute pain    Pain Radiating Towards pain into R hip and pain/numbness/tingling in R LE down to toes; legs tend to cramp R>L    Pain Frequency Constant    Aggravating Factors  walking, standing, laying down (supine) with leg straight    Pain Relieving Factors laying on L side, epsom salt bath, TENS unit    Effect of Pain on Daily Activities limited with walking, standing and bending              [facility] PT Assessment - 06/24/20 0935      Assessment   Medical Diagnosis Lumbar radiculopathy    Referring Provider (PT) Carmel Sacramento, NP    Next MD Visit 07/05/20      Prior Function  Level of Independence Independent    Vocation Unemployed    Leisure loves to cook but limited by standing tolerance; would like to be able to walk for exercise but currently unable; read; used to dance      AROM   Lumbar Flexion hands to mid shins - pain    Lumbar Extension 25% limited - increased LBP    Lumbar - Right Side Bend WFL - no pain    Lumbar - Left Side Bend WFL - no pain    Lumbar - Right Rotation 20% limited - increased pain    Lumbar - Left Rotation 10% limited - increased pain      Strength   Right Hip Flexion 4/5    Right Hip Extension 2+/5   very limited ROM + pain   Right Hip External Rotation  4/5   pain   Right Hip Internal Rotation 4/5   pain   Right Hip ABduction 4/5    Right Hip ADduction 3-/5    Left Hip Flexion 5/5    Left Hip Extension 2+/5   very limited ROM   Left Hip External Rotation 4+/5    Left Hip Internal Rotation 4+/5    Left Hip ABduction 5/5    Left Hip ADduction 3+/5    Right Knee  Flexion 4/5    Right Knee Extension 4+/5    Left Knee Flexion 5/5    Left Knee Extension 5/5    Right Ankle Dorsiflexion 5/5    Right Ankle Plantar Flexion --   unable to complete R single leg heel raise   Left Ankle Dorsiflexion 5/5    Left Ankle Plantar Flexion 4/5   9 SLS heel raises     Palpation   Palpation comment increased muscle tension and ttp in R>L lumbar paraspinals, glutes, piriformis, hip flexors, quads, HS, ITB and calves - palpation over glutes and piriformis triggering worsening of radicular pain                         [facility] Adult PT Treatment/Exercise - 06/24/20 0935      Exercises   Exercises Lumbar      Lumbar Exercises: Stretches   Pelvic Tilt 5 reps;10 seconds    Pelvic Tilt Limitations LEs 90-90 with lower legs resting in seat of chair on mat table      Lumbar Exercises: Aerobic   Nustep L3 x 6 min      Modalities   Modalities Traction      Traction   Type of Traction Lumbar    Min (lbs) 60    Max (lbs) 80    Hold Time 60    Rest Time 20    Time 10      Manual Therapy   Manual Therapy Soft tissue mobilization;Myofascial release;Manual Traction    Soft tissue mobilization STM/DTM to R glutes/piriformis    Myofascial Release manual TPR to R upper glutes - gradual relief noted with sustained pressure but pain rebounded when pressure released    Manual Traction R LE longitudinal distraction                  PT Education - 06/24/20 1010    Education Details Progress toward goals and recommendations for continued PT including probable DN; Role of traction    Person(s) Educated Patient    Methods Explanation    Comprehension Verbalized understanding  PT Short Term Goals - 06/24/20 0947      PT SHORT TERM GOAL #1   Title Patient will be independent with initial HEP    Status Achieved   06/01/20     PT SHORT TERM GOAL #2   Title Patient will verbalize/demonstrate understanding of neutral spine posture and  proper body mechanics to reduce strain on lumbar spine    Status Achieved   06/22/20            PT Long Term Goals - 06/24/20 0947      PT LONG TERM GOAL #1   Title Patient will be independent with ongoing/advanced HEP for self-management at home    Status Partially Met   06/22/20 - met for current HEP   Target Date 07/22/20      PT LONG TERM GOAL #2   Title Patient to demonstrate appropriate posture and body mechanics needed for daily activities    Status Achieved   06/22/20     PT LONG TERM GOAL #3   Title Patient to report reduction in frequency and intensity of LBP and LE radicular pain by >/= 25%    Status On-going   06/22/20 - pain and radicular symptoms remain about the same   Target Date 07/22/20      PT LONG TERM GOAL #4   Title Patient will demonstrate improved B LE strength to >/= 4+/5 for improved stability and ease of mobility    Status On-going    Target Date 07/22/20      PT LONG TERM GOAL #5   Title Patient will improve standing and/or walking tolerance to >/=  20-30 minutes w/o pain interference to allow resumption of normal daily activities    Status On-going    Target Date 07/22/20                 Plan - 06/24/20 1015    Clinical Impression Statement Loveah reports her pain has improved a little bit, but her R side remains painful from low back through R hip and down R LE with continued numbness and tingling still present. She notes temporary relief from stretches and exercises as well as estim but not able to get consistent lasting relief thus far. Lumbar ROM improved in B side bending to [facility] as well as to a lesser degree extension but pain and tightness still limiting most motions. Proximal LE strength has improved but continued significant weakness still noted in B hip extension and adduction as well as R weightbearing PF. Manual TPR will produce temporary decrease in pain and radicular symptoms but often rebound pain noted with release of pressure - pt  may benefit from DN to achieve more lasting relief of muscle tension. Initial trial of mechanical traction today resulting in potential reduction in radicular symptoms although increased intensity noted in low back - potential indicating centralization of pain. Inga has good potential to further benefit from skilled PT to reduce LBP and LE radiculopathy as well as improve lumbopelvic flexibility and strength to improve activity tolerance.    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; TIA    Examination-Activity Limitations Bathing;Bed Mobility;Bend;Caring for Others;Carry;Dressing;Hygiene/Grooming;Lift;Locomotion Level;Reach Overhead;Sit;Sleep;Squat;Stairs;Stand;Toileting;Transfers    Examination-Participation Restrictions Cleaning;Community Activity;Driving;Laundry;Meal Prep;Occupation;Shop    Rehab Potential Good    PT Frequency 2x / week    PT Duration 4 weeks  PT Treatment/Interventions ADLs/Self Care Home Management;Cryotherapy;Electrical Stimulation;Iontophoresis 4mg /ml Dexamethasone;Moist Heat;Traction;Ultrasound;Gait training;Stair training;Functional mobility training;Therapeutic activities;Therapeutic exercise;Balance training;Neuromuscular re-education;Patient/family education;Manual techniques;Passive range of motion;Dry needling;Taping;Spinal Manipulations    PT Next Visit Plan assess response to traction; manual therapy including STM/DTM and DN as indicated to lumbar paraspinals and hip/buttock; lumbopelvic/hip flexibilty & strengthening - progress tolerance to standing; review HEP & update as indicated; modalities as indicated and benefit noted; posture and body mechanics review PRN to reduce lumbar strain    PT Home Exercise  Plan MedBridge Access Codes: [id number] 05/19/2022), [id number] (3/23);  [email or web address] (4/4)    Consulted and Agree with Plan of Care Patient           Patient will benefit from skilled therapeutic intervention in order to improve the following deficits and impairments:  Decreased activity tolerance,Decreased endurance,Decreased knowledge of precautions,Decreased mobility,Decreased range of motion,Decreased safety awareness,Decreased strength,Difficulty walking,Hypomobility,Increased fascial restricitons,Increased muscle spasms,Impaired perceived functional ability,Impaired flexibility,Impaired sensation,Improper body mechanics,Postural dysfunction,Pain  Visit Diagnosis: Chronic bilateral low back pain with bilateral sciatica  Radiculopathy, lumbar region  Muscle spasm of back  Muscle weakness (generalized)     Problem List Patient Active Problem List   Diagnosis Date Noted  . 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 Chronic daily headache 08/17/2016  . Chronic pelvic pain in female 06/27/2016  . Bilateral mastodynia 06/27/2016  . Galactorrhea, bilateral 06/27/2016  . Low back pain radiating to both legs 12/29/2015  . Convulsions ([place]) 06/08/2015  . Generalized anxiety disorder 06/08/2015  . Chronic pain syndrome 06/08/2015  . Migraine without aura and without status migrainosus, not intractable 06/08/2015  . Nonintractable epilepsy with status epilepticus ([place]) 05/28/2015  . Seizure disorder ([place]) 05/28/2015  . Depression 05/28/2015  . Obstructive sleep apnea 05/28/2015  . Tobacco dependence 05/28/2015  . Neuropathy     Percival Spanish, PT,  MPT 06/24/2020, 11:18 AM  [facility] [address]  [place], [place], [address] Phone: [phone]   Fax:  [phone]  Name: Caliya Narine MRN: [id number] Date of Birth: [birth year]

## 2020-06-30 ENCOUNTER — Ambulatory Visit: Payer: Medicaid Other | Admitting: Physical Therapy

## 2020-07-02 ENCOUNTER — Ambulatory Visit: Payer: Medicaid Other | Attending: Nurse Practitioner

## 2020-07-02 ENCOUNTER — Other Ambulatory Visit: Payer: Self-pay

## 2020-07-02 DIAGNOSIS — M5416 Radiculopathy, lumbar region: Secondary | ICD-10-CM | POA: Insufficient documentation

## 2020-07-02 DIAGNOSIS — G8929 Other chronic pain: Secondary | ICD-10-CM | POA: Diagnosis present

## 2020-07-02 DIAGNOSIS — M5441 Lumbago with sciatica, right side: Secondary | ICD-10-CM | POA: Insufficient documentation

## 2020-07-02 DIAGNOSIS — M5442 Lumbago with sciatica, left side: Secondary | ICD-10-CM | POA: Diagnosis not present

## 2020-07-02 DIAGNOSIS — M6281 Muscle weakness (generalized): Secondary | ICD-10-CM | POA: Insufficient documentation

## 2020-07-02 DIAGNOSIS — M62830 Muscle spasm of back: Secondary | ICD-10-CM | POA: Diagnosis present

## 2020-07-02 NOTE — Therapy (Signed)
[facility] [address]  [place], [place], [address] Phone: [phone]   Fax:  [phone]  Physical Therapy Treatment  Patient Details  Name: Angela Doyle MRN: [id number] Date of Birth: May 30, 1985 Referring Provider (PT): Emelia Loron, NP   Encounter Date: 07/02/2020   PT End of Session - 07/02/20 1055    Visit Number 11    Number of Visits 18    Date for PT Re-Evaluation 07/22/20    Authorization Type UHC Medicaid - VL: 27    PT Start Time 1010    PT Stop Time 1107    PT Time Calculation (min) 57 min    Activity Tolerance Patient limited by pain;Patient tolerated treatment well    Behavior During Therapy [facility] for tasks assessed/performed           Past Medical History:  Diagnosis Date  . Chronic back pain   . Depression   . Fibromyalgia   . Headache    migraine  . Hypercholesterolemia   . Neuropathy   . Osteoarthritis   . Pseudoseizures ([place])   . Seizure ([place])    pseudo seizures  . TIA (transient ischemic attack)   . Vertigo     Past Surgical History:  Procedure Laterality Date  . CESAREAN SECTION  2011  . OVARIAN CYST SURGERY      There were no vitals filed for this visit.   Subjective Assessment - 07/02/20 1007    Subjective Pt reports increased pain low back possibly d/t centralization of symptoms, still some pain over R greater trochanter area.    Diagnostic tests Lumbar MRI 04/18/18: 1. No lumbar disc degeneration or acute osseous abnormality.  2. Widespread lumbar facet hypertrophy. No lumbar spinal stenosis,  but up to mild bilateral neural foraminal stenosis at the L3 through  L5 nerve levels.    Patient Stated Goals "to strengthen my legs and to alleviate some pain in my back so I can walk and stand longer"    Currently in Pain? Yes    Pain Score 5     Pain Location Back    Pain Orientation Right    Pain Descriptors / Indicators Sharp;Aching;Dull    Pain Type Acute pain     Multiple Pain Sites Yes    Pain Score 7    Pain Location Hip    Pain Orientation Right;Lateral    Pain Descriptors / Indicators Constant;Aching                             [facility] Adult PT Treatment/Exercise - 07/02/20 0001      Exercises   Exercises Lumbar      Lumbar Exercises: Stretches   Press Ups 5 reps;10 seconds    Piriformis Stretch Right;2 reps;30 seconds    Piriformis Stretch Limitations supine KTOS      Lumbar Exercises: Aerobic   Nustep L3x104mn      Lumbar Exercises: Supine   Pelvic Tilt 10 reps;5 seconds    Pelvic Tilt Limitations decreased APT d/t pain on R side      Moist Heat Therapy   Number Minutes Moist Heat 15 Minutes    Moist Heat Location Lumbar Spine      Electrical Stimulation   Electrical Stimulation Location R lumbar paraspinals & upper glutes    Electrical Stimulation Action IFC    Electrical Stimulation Parameters 80-150 Hz, intensity to pt tolerance  Electrical Stimulation Goals Pain;Tone      Manual Therapy   Manual Therapy Joint mobilization    Joint Mobilization B hip inferior glide grade III/IV for lumbar distraction    Soft tissue mobilization STM/DTM to R glutes/piriformis    Myofascial Release manual TPR to R piriformis/glutes                    PT Short Term Goals - 06/24/20 0947      PT SHORT TERM GOAL #1   Title Patient will be independent with initial HEP    Status Achieved   06/01/20     PT SHORT TERM GOAL #2   Title Patient will verbalize/demonstrate understanding of neutral spine posture and proper body mechanics to reduce strain on lumbar spine    Status Achieved   06/22/20            PT Long Term Goals - 06/24/20 0947      PT LONG TERM GOAL #1   Title Patient will be independent with ongoing/advanced HEP for self-management at home    Status Partially Met   06/22/20 - met for current HEP   Target Date 07/22/20      PT LONG TERM GOAL #2   Title Patient to demonstrate appropriate  posture and body mechanics needed for daily activities    Status Achieved   06/22/20     PT LONG TERM GOAL #3   Title Patient to report reduction in frequency and intensity of LBP and LE radicular pain by >/= 25%    Status On-going   06/22/20 - pain and radicular symptoms remain about the same   Target Date 07/22/20      PT LONG TERM GOAL #4   Title Patient will demonstrate improved B LE strength to >/= 4+/5 for improved stability and ease of mobility    Status On-going    Target Date 07/22/20      PT LONG TERM GOAL #5   Title Patient will improve standing and/or walking tolerance to >/=  20-30 minutes w/o pain interference to allow resumption of normal daily activities    Status On-going    Target Date 07/22/20                 Plan - 07/02/20 1058    Clinical Impression Statement Pt noted some centralization of symptoms in R LB with increased low back pain and less pain running down posterior thigh. She reported a good response from manual traction, also noting an increase in LBP d/t centralization of symptoms. She noted immediate relief with manual traction and STM today but worries about getting long lasting relief. Was unable to do bridges d/t causing pain in low back but she did get relief from prone press ups. She is showing improvements in pain levels from traction, STM, stretching exercises.    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; TIA    PT Frequency 2x / week    PT Duration 4 weeks    PT Treatment/Interventions ADLs/Self Care Home Management;Cryotherapy;Electrical Stimulation;Iontophoresis 32m/ml Dexamethasone;Moist Heat;Traction;Ultrasound;Gait training;Stair training;Functional mobility training;Therapeutic  activities;Therapeutic exercise;Balance training;Neuromuscular re-education;Patient/family education;Manual techniques;Passive range of motion;Dry needling;Taping;Spinal Manipulations    PT Next Visit Plan manual therapy including STM/DTM and DN as indicated to lumbar paraspinals and hip/buttock; lumbopelvic/hip flexibilty & strengthening - progress tolerance to standing; review  HEP & update as indicated; modalities as indicated and benefit noted; posture and body mechanics review PRN to reduce lumbar strain    PT [place] Access Codes: [id number] 05-20-22), [id number] (3/23);  [email or web address] (4/4)    Consulted and Agree with Plan of Care Patient           Patient will benefit from skilled therapeutic intervention in order to improve the following deficits and impairments:  Decreased activity tolerance,Decreased endurance,Decreased knowledge of precautions,Decreased mobility,Decreased range of motion,Decreased safety awareness,Decreased strength,Difficulty walking,Hypomobility,Increased fascial restricitons,Increased muscle spasms,Impaired perceived functional ability,Impaired flexibility,Impaired sensation,Improper body mechanics,Postural dysfunction,Pain  Visit Diagnosis: Chronic bilateral low back pain with bilateral sciatica  Radiculopathy, lumbar region  Muscle spasm of back  Muscle weakness (generalized)     Problem List Patient Active Problem List   Diagnosis Date Noted  . 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 Chronic daily headache 08/17/2016  . Chronic pelvic pain in female 06/27/2016  . Bilateral mastodynia 06/27/2016  . Galactorrhea, bilateral 06/27/2016  . Low back pain radiating to both legs 12/29/2015  . Convulsions ([place]) 06/08/2015  . Generalized anxiety disorder 06/08/2015  . Chronic pain syndrome 06/08/2015  . Migraine without aura and without status migrainosus, not intractable 06/08/2015  . 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 Seizure disorder ([place]) 05/28/2015  . Depression 05/28/2015  . Obstructive sleep apnea 05/28/2015  . Tobacco dependence 05/28/2015  . Neuropathy     Artist Pais, PTA 07/02/2020, 11:13 AM  [facility] [address]  [place], [place], [address] Phone: [phone]   Fax:  [phone]  Name: Etter Royall MRN: [id number] Date of Birth: [birth year]

## 2020-07-07 ENCOUNTER — Ambulatory Visit: Payer: Medicaid Other

## 2020-07-07 ENCOUNTER — Other Ambulatory Visit: Payer: Self-pay

## 2020-07-07 DIAGNOSIS — M6281 Muscle weakness (generalized): Secondary | ICD-10-CM

## 2020-07-07 DIAGNOSIS — M5442 Lumbago with sciatica, left side: Secondary | ICD-10-CM | POA: Diagnosis not present

## 2020-07-07 DIAGNOSIS — M62830 Muscle spasm of back: Secondary | ICD-10-CM

## 2020-07-07 DIAGNOSIS — G8929 Other chronic pain: Secondary | ICD-10-CM

## 2020-07-07 DIAGNOSIS — M5416 Radiculopathy, lumbar region: Secondary | ICD-10-CM

## 2020-07-07 NOTE — Patient Instructions (Signed)
Access Code: [email or web address] URL: [email or web address] Date: 07/07/2020 Prepared by: Gardiner Rhyme  Exercises Seated Table Piriformis Stretch - 2-3 x daily - 7 x weekly - 3 reps - 30 sec hold Seated Hamstring Stretch - 2-3 x daily - 7 x weekly - 3 reps - 30 sec hold Sidelying Thoracic Lumbar Rotation - 1 x daily - 7 x weekly - 2 sets - 10 reps - 3 hold Sidelying Quadratus Lumborum Stretch on Table - 1 x daily - 7 x weekly - 3 sets - 30 seconds hold Seated Piriformis Stretch - 1 x daily - 7 x weekly - 2-3 sets - 1 reps - 30 seconds hold Sit to Stand - 1 x daily - 7 x weekly - 1-2 sets - 10 reps Standing with Back Flat Against Wall - 1 x daily - 7 x weekly - 1-2 sets - 10 reps - 5 second holds hold  Patient Education Trigger Point Dry Needling [place] Patient Instructions

## 2020-07-07 NOTE — Therapy (Signed)
[facility] [address]  [place], [place], [address] Phone: [phone]   Fax:  [phone]  Physical Therapy Treatment  Patient Details  Name: Angela Doyle MRN: [id number] Date of Birth: 01/17/1986 Referring Provider (PT): Emelia Loron, NP   Encounter Date: 07/07/2020   PT End of Session - 07/07/20 1117    Visit Number 12    Number of Visits 18    Date for PT Re-Evaluation 07/22/20    Authorization Type U[employer] Medicaid - VL: 27    PT Start Time 1018    PT Stop Time 1100    PT Time Calculation (min) 42 min    Activity Tolerance Patient limited by pain;Patient tolerated treatment well    Behavior During Therapy [facility] for tasks assessed/performed           Past Medical History:  Diagnosis Date  . Chronic back pain   . Depression   . Fibromyalgia   . Headache    migraine  . Hypercholesterolemia   . Neuropathy   . Osteoarthritis   . Pseudoseizures ([place])   . Seizure ([place])    pseudo seizures  . TIA (transient ischemic attack)   . Vertigo     Past Surgical History:  Procedure Laterality Date  . CESAREAN SECTION  2011  . OVARIAN CYST SURGERY      There were no vitals filed for this visit.   Subjective Assessment - 07/07/20 1021    Subjective Pt reports some lasting relief from last session for about 2 hours. She fell yesterday after sitting for 10 minutes and getting up to walk to the kitchen. Her legs gave out and she fell on her right hip.    Diagnostic tests Lumbar MRI 04/18/18: 1. No lumbar disc degeneration or acute osseous abnormality.  2. Widespread lumbar facet hypertrophy. No lumbar spinal stenosis,  but up to mild bilateral neural foraminal stenosis at the L3 through  L5 nerve levels.    Patient Stated Goals "to strengthen my legs and to alleviate some pain in my back so I can walk and stand longer"    Currently in Pain? Yes    Pain Score 7     Pain Location Leg    Pain Orientation Right;Left    R>L   Pain Descriptors / Indicators Sharp;Shooting    Pain Radiating Towards to R knee (lateral thigh)                             [facility] Adult PT Treatment/Exercise - 07/07/20 0001      Self-Care   Self-Care Other Self-Care Comments    Other Self-Care Comments  see pt edu      Exercises   Exercises Lumbar      Lumbar Exercises: Standing   Other Standing Lumbar Exercises PPT against wall with small mini squat to reduce pressure on lumbar spine 10x5"   exhale to activate, inhale to relax     Lumbar Exercises: Seated   Sit to Stand 10 reps    Sit to Stand Limitations TrA activation with exhale prior to initiating standing and sitting   verbal cues to try to achieve PPT/tuck and utilize hands under belly to pull up as needed     Lumbar Exercises: Supine   Pelvic Tilt 15 reps;5 seconds    Pelvic Tilt Limitations PPT with exhale, inhale to neutral (pt already into APT at rest)  Other Supine Lumbar Exercises TrA activation + hip ADD using 2 therapads to increase pelvic floor work (exhale to squeeze, inhale to relax)      Manual Therapy   Manual Therapy Joint mobilization;Soft tissue mobilization;Passive ROM    Manual therapy comments prone with pillow under hips then no pillow    Joint Mobilization sacral thrust and pressure downward for neutral pelvic tilt    Soft tissue mobilization STM/DTM R glutes and piriformis, R hip flexors and quads    Passive ROM manual hip flexor stretching in supien with L LE in H/L                  PT Education - 07/07/20 1115    Education Details Utilized skeleton and pelvis for demonstration of increased lumbar lordosis, anterior pelvic tilt (lower crossed syndrome) and connections of psoas for explanation of tight hip flexors; edu on core strength and alignment for proper firing, TrA firing with sit <> stand and transfers, pelvic tilt against wall, lying in prone on bed at home with pillow under hips progresing to no pillow as  able, added to HEP    Person(s) Educated Patient    Methods Explanation;Demonstration;Tactile cues;Verbal cues;Handout    Comprehension Verbalized understanding;Returned demonstration;Verbal cues required;Tactile cues required            PT Short Term Goals - 06/24/20 0947      PT SHORT TERM GOAL #1   Title Patient will be independent with initial HEP    Status Achieved   06/01/20     PT SHORT TERM GOAL #2   Title Patient will verbalize/demonstrate understanding of neutral spine posture and proper body mechanics to reduce strain on lumbar spine    Status Achieved   06/22/20            PT Long Term Goals - 06/24/20 0947      PT LONG TERM GOAL #1   Title Patient will be independent with ongoing/advanced HEP for self-management at home    Status Partially Met   06/22/20 - met for current HEP   Target Date 07/22/20      PT LONG TERM GOAL #2   Title Patient to demonstrate appropriate posture and body mechanics needed for daily activities    Status Achieved   06/22/20     PT LONG TERM GOAL #3   Title Patient to report reduction in frequency and intensity of LBP and LE radicular pain by >/= 25%    Status On-going   06/22/20 - pain and radicular symptoms remain about the same   Target Date 07/22/20      PT LONG TERM GOAL #4   Title Patient will demonstrate improved B LE strength to >/= 4+/5 for improved stability and ease of mobility    Status On-going    Target Date 07/22/20      PT LONG TERM GOAL #5   Title Patient will improve standing and/or walking tolerance to >/=  20-30 minutes w/o pain interference to allow resumption of normal daily activities    Status On-going    Target Date 07/22/20                 Plan - 07/07/20 1056    Clinical Impression Statement Pt presents 1 day after fall at home due to B LE giving out during a walk to the kitchen after a 10 minute period of sitting. Pt was not up to warming up on the NuStep today. She tolerated  treatment well,  spending increased time on education today to improve knowledge of anatomy and understanding of why pt is having these issues. Pt rests in significant lumbar lordosis and anterior pelvic tilt with very tight psoas and iliopsoas. Initially with partial hooklying, pt was unable to maintain flat back, but could after activating TrA in H/L via PPT and breath. Facilitated TrA and pelvic floor activation before performing sit <> stand with reduced pain reported. Pt did have some residual right hip pain post session.    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; TIA    PT Frequency 2x / week    PT Duration 4 weeks    PT Treatment/Interventions ADLs/Self Care Home Management;Cryotherapy;Electrical Stimulation;Iontophoresis 46m/ml Dexamethasone;Moist Heat;Traction;Ultrasound;Gait training;Stair training;Functional mobility training;Therapeutic activities;Therapeutic exercise;Balance training;Neuromuscular re-education;Patient/family education;Manual techniques;Passive range of motion;Dry needling;Taping;Spinal Manipulations    PT Next Visit Plan manual therapy including STM/DTM and DN as indicated to lumbar paraspinals and hip/buttock; lumbopelvic/hip flexibilty & strengthening - progress tolerance to standing; review HEP & update as indicated; modalities as indicated and benefit noted; posture and body mechanics review PRN to reduce lumbar strain    PT Home Exercise Plan MedBridge Access Codes: [id number] (06-04-22, [id number](3/23);  [email or web address] (4/4)    Consulted and Agree with Plan of Care Patient           Patient will benefit from skilled therapeutic intervention in order to improve the following deficits and impairments:  Decreased activity  tolerance,Decreased endurance,Decreased knowledge of precautions,Decreased mobility,Decreased range of motion,Decreased safety awareness,Decreased strength,Difficulty walking,Hypomobility,Increased fascial restricitons,Increased muscle spasms,Impaired perceived functional ability,Impaired flexibility,Impaired sensation,Improper body mechanics,Postural dysfunction,Pain  Visit Diagnosis: Chronic bilateral low back pain with bilateral sciatica  Radiculopathy, lumbar region  Muscle spasm of back  Muscle weakness (generalized)     Problem List Patient Active Problem List   Diagnosis Date Noted  . 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 Chronic daily headache 08/17/2016  . Chronic pelvic pain in female 06/27/2016  . Bilateral mastodynia 06/27/2016  . Galactorrhea, bilateral 06/27/2016  . Low back pain radiating to both legs 12/29/2015  . [place]ulsions ([place] 06/08/2015  . Generalized anxiety disorder 06/08/2015  . Chronic pain syndrome 06/08/2015  . Migraine without aura and without status migrainosus, not intractable 06/08/2015  . Nonintractable epilepsy with status epilepticus ([place] 05/28/2015  . Seizure disorder ([place] 05/28/2015  . Depression 05/28/2015  . Obstructive sleep apnea 05/28/2015  . Tobacco dependence 05/28/2015  . Neuropathy     KIzell Canadian PT, DPT 07/07/2020, 11:33 AM  [facility] [place] [address]Phone: [phone]  Fax:  [phone] Name: CReina WiltonMRN: [id number] of Birth: 910/06/1985

## 2020-07-09 ENCOUNTER — Other Ambulatory Visit: Payer: Self-pay

## 2020-07-09 ENCOUNTER — Ambulatory Visit: Payer: Medicaid Other

## 2020-07-09 DIAGNOSIS — M62830 Muscle spasm of back: Secondary | ICD-10-CM

## 2020-07-09 DIAGNOSIS — M5442 Lumbago with sciatica, left side: Secondary | ICD-10-CM

## 2020-07-09 DIAGNOSIS — M5416 Radiculopathy, lumbar region: Secondary | ICD-10-CM

## 2020-07-09 DIAGNOSIS — G8929 Other chronic pain: Secondary | ICD-10-CM

## 2020-07-09 DIAGNOSIS — M6281 Muscle weakness (generalized): Secondary | ICD-10-CM

## 2020-07-09 NOTE — Therapy (Signed)
[facility] [address]  [address] [place], [place], [address] Phone: [phone]   Fax:  [phone]  Physical Therapy Treatment  Patient Details  Name: Angela Doyle MRN: [id number] Date of Birth: 03-16-1985 Referring Provider (PT): Carmel Sacramento, NP   Encounter Date: 07/09/2020   PT End of Session - 07/09/20 1153    Visit Number 13    Number of Visits 18    Date for PT Re-Evaluation 07/22/20    Authorization Type UHC Medicaid - VL: 27    PT Start Time 1106    PT Stop Time 1201    PT Time Calculation (min) 55 min    Activity Tolerance Patient limited by pain;Patient tolerated treatment well    Behavior During Therapy [facility] for tasks assessed/performed           Past Medical History:  Diagnosis Date  . Chronic back pain   . Depression   . Fibromyalgia   . Headache    migraine  . Hypercholesterolemia   . Neuropathy   . Osteoarthritis   . 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 Seizure ([facility])    pseudo seizures  . TIA (transient ischemic attack)   . Vertigo     Past Surgical History:  Procedure Laterality Date  . CESAREAN SECTION  2011  . OVARIAN CYST SURGERY      There were no vitals filed for this visit.   Subjective Assessment - 07/09/20 1107    Subjective Pt reports feeling "groggy" today, also has pinched nerve in her R neck. R lower leg feeling about the same.   Diagnostic tests Lumbar MRI 04/18/18: 1. No lumbar disc degeneration or acute osseous abnormality.  2. Widespread lumbar facet hypertrophy. No lumbar spinal stenosis,  but up to mild bilateral neural foraminal stenosis at the L3 through  L5 nerve levels.    Patient Stated Goals "to strengthen my legs and to alleviate some pain in my back so I can walk and stand longer"    Currently in Pain? Yes    Pain Score 5     Pain Location Leg    Pain Orientation Right    Pain Descriptors / Indicators Sharp;Shooting    Pain Type Acute pain    Pain Score 6    Pain  Location Shoulder    Pain Orientation Right;Upper    Pain Descriptors / Indicators Sharp    Pain Type Acute pain                             [facility] Adult PT Treatment/Exercise - 07/09/20 0001      Exercises   Exercises Lumbar      Lumbar Exercises: Stretches   Passive Hamstring Stretch Right;2 reps;20 seconds    Passive Hamstring Stretch Limitations manual ankle DF    Hip Flexor Stretch Right;2 reps;30 seconds    Hip Flexor Stretch Limitations thomas position      Lumbar Exercises: Seated   Other Seated Lumbar Exercises sciatic nerve glide 12 reps      Lumbar Exercises: Supine   Pelvic Tilt 10 reps;5 seconds    Pelvic Tilt Limitations PPT (pt already into APT at rest)      Moist Heat Therapy   Number Minutes Moist Heat 12 Minutes    Moist Heat Location Lumbar Spine      Electrical Stimulation   Electrical Stimulation Location R lumbar paraspinals & upper glutes  Electrical Stimulation Action IFC    Electrical Stimulation Parameters 80-150 Hz, intensity to pt tolerance    Electrical Stimulation Goals Pain;Tone      Manual Therapy   Soft tissue mobilization STM/DTM R glutes and piriformis- had to stop d/t burning    Myofascial Release manual TPR to R piriformis/glutes    Manual Traction B distraction with LEs extended and prolonged holds in supine                    PT Short Term Goals - 06/24/20 0947      PT SHORT TERM GOAL #1   Title Patient will be independent with initial HEP    Status Achieved   06/01/20     PT SHORT TERM GOAL #2   Title Patient will verbalize/demonstrate understanding of neutral spine posture and proper body mechanics to reduce strain on lumbar spine    Status Achieved   06/22/20            PT Long Term Goals - 06/24/20 0947      PT LONG TERM GOAL #1   Title Patient will be independent with ongoing/advanced HEP for self-management at home    Status Partially Met   06/22/20 - met for current HEP   Target Date  07/22/20      PT LONG TERM GOAL #2   Title Patient to demonstrate appropriate posture and body mechanics needed for daily activities    Status Achieved   06/22/20     PT LONG TERM GOAL #3   Title Patient to report reduction in frequency and intensity of LBP and LE radicular pain by >/= 25%    Status On-going   06/22/20 - pain and radicular symptoms remain about the same   Target Date 07/22/20      PT LONG TERM GOAL #4   Title Patient will demonstrate improved B LE strength to >/= 4+/5 for improved stability and ease of mobility    Status On-going    Target Date 07/22/20      PT LONG TERM GOAL #5   Title Patient will improve standing and/or walking tolerance to >/=  20-30 minutes w/o pain interference to allow resumption of normal daily activities    Status On-going    Target Date 07/22/20                 Plan - 07/09/20 1159    Clinical Impression Statement Pt has a lot of tightness in R hip flexors and did some hip flexor stretching to decrease the muscle tension. Also incorporated sciatic nerve glide into todays session to reduce pain. She reported that the nerve glide did help and educated her on how to do at home. Did STM to R glutes and piriformis with a good response from pt initially but had to cease d/t pt reporting increased burning along sciatic nerve. Pt reports radicular symptoms decreasing but pain over the greater trochanter became more noticeable. Pt continues to respond well to manual traction and ended session with estim and heat.    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; TIA    PT Frequency 2x / week    PT Duration 4 weeks    PT Treatment/Interventions ADLs/Self Care Home  Management;Cryotherapy;Electrical Stimulation;Iontophoresis 4mg /ml Dexamethasone;Moist Heat;Traction;Ultrasound;Gait training;Stair training;Functional mobility training;Therapeutic activities;Therapeutic exercise;Balance training;Neuromuscular re-education;Patient/family education;Manual techniques;Passive  range of motion;Dry needling;Taping;Spinal Manipulations    PT Next Visit Plan manual therapy including STM/DTM and DN as indicated to lumbar paraspinals and hip/buttock; lumbopelvic/hip flexibilty & strengthening - progress tolerance to standing; review HEP & update as indicated; modalities as indicated and benefit noted; posture and body mechanics review PRN to reduce lumbar strain    PT Home Exercise Plan MedBridge Access Codes: [id number] May 17, 2022), [id number] (3/23);  [email or web address] (4/4)    Consulted and Agree with Plan of Care Patient           Patient will benefit from skilled therapeutic intervention in order to improve the following deficits and impairments:  Decreased activity tolerance,Decreased endurance,Decreased knowledge of precautions,Decreased mobility,Decreased range of motion,Decreased safety awareness,Decreased strength,Difficulty walking,Hypomobility,Increased fascial restricitons,Increased muscle spasms,Impaired perceived functional ability,Impaired flexibility,Impaired sensation,Improper body mechanics,Postural dysfunction,Pain  Visit Diagnosis: Chronic bilateral low back pain with bilateral sciatica  Radiculopathy, lumbar region  Muscle spasm of back  Muscle weakness (generalized)     Problem List Patient Active Problem List   Diagnosis Date Noted  . 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 Chronic daily headache 08/17/2016  . Chronic pelvic pain in female 06/27/2016  . Bilateral mastodynia 06/27/2016  . Galactorrhea, bilateral 06/27/2016  . Low back pain radiating to both legs 12/29/2015  . Convulsions ([place]) 06/08/2015  . Generalized anxiety  disorder 06/08/2015  . Chronic pain syndrome 06/08/2015  . Migraine without aura and without status migrainosus, not intractable 06/08/2015  . Nonintractable epilepsy with status epilepticus ([place]) 05/28/2015  . Seizure disorder ([place]) 05/28/2015  . Depression 05/28/2015  . Obstructive sleep apnea 05/28/2015  . Tobacco dependence 05/28/2015  . Neuropathy     Artist Pais, PTA 07/09/2020, 12:11 PM  [facility] [address]  [place], [place], [address] Phone: [phone]   Fax:  [phone]  Name: Jaxson Keener MRN: [id number] Date of Birth: 1985-03-22

## 2020-07-14 ENCOUNTER — Encounter: Payer: Self-pay | Admitting: Physical Therapy

## 2020-07-14 ENCOUNTER — Other Ambulatory Visit: Payer: Self-pay

## 2020-07-14 ENCOUNTER — Ambulatory Visit: Payer: Medicaid Other | Admitting: Physical Therapy

## 2020-07-14 DIAGNOSIS — M6281 Muscle weakness (generalized): Secondary | ICD-10-CM

## 2020-07-14 DIAGNOSIS — M5442 Lumbago with sciatica, left side: Secondary | ICD-10-CM | POA: Diagnosis not present

## 2020-07-14 DIAGNOSIS — M62830 Muscle spasm of back: Secondary | ICD-10-CM

## 2020-07-14 DIAGNOSIS — M5416 Radiculopathy, lumbar region: Secondary | ICD-10-CM

## 2020-07-14 DIAGNOSIS — G8929 Other chronic pain: Secondary | ICD-10-CM

## 2020-07-14 DIAGNOSIS — M5441 Lumbago with sciatica, right side: Secondary | ICD-10-CM

## 2020-07-14 NOTE — Therapy (Signed)
[facility] Outpatient Rehabilitation [facility] [address]  [address] [place], [place], [address] Phone: [phone]   Fax:  [phone]  Physical Therapy Treatment  Patient Details  Name: Angela Doyle MRN: [id number] Date of Birth: 1985-12-12 Referring Provider (PT): Carmel Sacramento, NP   Encounter Date: 07/14/2020   PT End of Session - 07/14/20 1018    Visit Number 14    Number of Visits 18    Date for PT Re-Evaluation 07/22/20    Authorization Type UHC Medicaid - VL: 27    PT Start Time 1018    PT Stop Time 1120    PT Time Calculation (min) 62 min    Activity Tolerance Patient limited by pain;Patient tolerated treatment well    Behavior During Therapy [facility] for tasks assessed/performed           Past Medical History:  Diagnosis Date  . Chronic back pain   . Depression   . Fibromyalgia   . Headache    migraine  . Hypercholesterolemia   . Neuropathy   . Osteoarthritis   . 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 Seizure (HCC)    pseudo seizures  . TIA (transient ischemic attack)   . Vertigo     Past Surgical History:  Procedure Laterality Date  . CESAREAN SECTION  2011  . OVARIAN CYST SURGERY      There were no vitals filed for this visit.   Subjective Assessment - 07/14/20 1022    Subjective Pt reports new pain in R ankle this morning upon attempting to put weight on her feet as she was getting out of bed. No known injury but pain is making her limp and have difficulty walking.    Diagnostic tests Lumbar MRI 04/18/18: 1. No lumbar disc degeneration or acute osseous abnormality.  2. Widespread lumbar facet hypertrophy. No lumbar spinal stenosis,  but up to mild bilateral neural foraminal stenosis at the L3 through  L5 nerve levels.    Patient Stated Goals "to strengthen my legs and to alleviate some pain in my back so I can walk and stand longer"    Currently in Pain? Yes    Pain Score 6     Pain Location Hip    Pain Orientation Right    Pain Descriptors /  Indicators Sharp    Pain Frequency Intermittent    Pain Score 8    Pain Location Ankle    Pain Orientation Right    Pain Descriptors / Indicators Sharp;Dull;Aching    Pain Type Acute pain    Pain Onset Today                             [facility] Adult PT Treatment/Exercise - 07/14/20 1018      Exercises   Exercises Lumbar      Lumbar Exercises: Stretches   Passive Hamstring Stretch Right;2 reps;30 seconds    Passive Hamstring Stretch Limitations supine with strap + DF for heelcord stretch    ITB Stretch Right;2 reps;30 seconds    ITB Stretch Limitations standing lateral lean over back of chair    Figure 4 Stretch 2 reps;30 seconds;Seated    Figure 4 Stretch Limitations side-sitting figure-4    Gastroc Stretch Right;2 reps;30 seconds   each   Gastroc Stretch Limitations supine gastroc & soleus stretches with strap; standing runner's stretch, negative heel and toes on baseboard      Lumbar Exercises: Aerobic  Nustep L3 x 4 min (UE/LE)   pt had to stop due to ankle pain     Modalities   Modalities Electrical Stimulation;Moist Heat;Iontophoresis      Moist Heat Therapy   Number Minutes Moist Heat 15 Minutes    Moist Heat Location Lumbar Spine;Ankle   R calf/ankle     Electrical Stimulation   Electrical Stimulation Location R med/lat ankle & calf    Electrical Stimulation Action IFC    Electrical Stimulation Parameters 80-150 Hz, intensity to pt tolerance x 15'    Electrical Stimulation Goals Pain;Tone      Iontophoresis   Type of Iontophoresis Dexamethasone    Location R greater trochanter    Dose 80 mA-min, 1.0 mL    Time 4-6 hr patch (#4 of 6)      Manual Therapy   Manual Therapy Soft tissue mobilization    Soft tissue mobilization STM/DTM & roller stick to R calf                    PT Short Term Goals - 06/24/20 0947      PT SHORT TERM GOAL #1   Title Patient will be independent with initial HEP    Status Achieved   06/01/20     PT  SHORT TERM GOAL #2   Title Patient will verbalize/demonstrate understanding of neutral spine posture and proper body mechanics to reduce strain on lumbar spine    Status Achieved   06/22/20            PT Long Term Goals - 06/24/20 0947      PT LONG TERM GOAL #1   Title Patient will be independent with ongoing/advanced HEP for self-management at home    Status Partially Met   06/22/20 - met for current HEP   Target Date 07/22/20      PT LONG TERM GOAL #2   Title Patient to demonstrate appropriate posture and body mechanics needed for daily activities    Status Achieved   06/22/20     PT LONG TERM GOAL #3   Title Patient to report reduction in frequency and intensity of LBP and LE radicular pain by >/= 25%    Status On-going   06/22/20 - pain and radicular symptoms remain about the same   Target Date 07/22/20      PT LONG TERM GOAL #4   Title Patient will demonstrate improved B LE strength to >/= 4+/5 for improved stability and ease of mobility    Status On-going    Target Date 07/22/20      PT LONG TERM GOAL #5   Title Patient will improve standing and/or walking tolerance to >/=  20-30 minutes w/o pain interference to allow resumption of normal daily activities    Status On-going    Target Date 07/22/20                 Plan - 07/14/20 1120    Clinical Impression Statement Angela Doyle limped into PT today reporting new onset R ankle pain this morning upon rising from bed which makes it difficult for her to put weight on her R leg - she initially could not identify any trigger for pain but at end of visit did note she had been walking more yesterday. R ankle pain limiting tolerance for warm-up on NuStep. Increased muscle tension noted in R gastroc/soleus but no isolated TPs identified - address with STM and stretching, providing instruction in stretching alternatives as  well as self-STM options with rolling pin for home. Pt also reporting increased lateral hip pain at R greater  trochanter, therefore reviewed stretches for ITB and piriformis to address tension over greater trochanter. Session concluded with estim and moist heat to R ankle/calf to promote further reduction in pain and muscle tension as well as ionto patch #4 for R greater trochanter.    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; TIA    PT Frequency 2x / week    PT Duration 4 weeks    PT Treatment/Interventions ADLs/Self Care Home Management;Cryotherapy;Electrical Stimulation;Iontophoresis 4mg /ml Dexamethasone;Moist Heat;Traction;Ultrasound;Gait training;Stair training;Functional mobility training;Therapeutic activities;Therapeutic exercise;Balance training;Neuromuscular re-education;Patient/family education;Manual techniques;Passive range of motion;Dry needling;Taping;Spinal Manipulations    PT Next Visit Plan manual therapy including STM/DTM and DN as indicated to lumbar paraspinals and hip/buttock; lumbopelvic/hip flexibilty & strengthening - progress tolerance to standing; review HEP & update as indicated; modalities as indicated and benefit noted; posture and body mechanics review PRN to reduce lumbar strain    PT Home Exercise Plan MedBridge Access Codes: [id number] 05/21/22), [id number] (3/23);  [email or web address] (4/4)    Consulted and Agree with Plan of Care Patient           Patient will benefit from skilled therapeutic intervention in order to improve the following deficits and impairments:  Decreased activity tolerance,Decreased endurance,Decreased knowledge of precautions,Decreased mobility,Decreased range of motion,Decreased safety awareness,Decreased strength,Difficulty walking,Hypomobility,Increased fascial restricitons,Increased muscle spasms,Impaired  perceived functional ability,Impaired flexibility,Impaired sensation,Improper body mechanics,Postural dysfunction,Pain  Visit Diagnosis: Chronic bilateral low back pain with bilateral sciatica  Radiculopathy, lumbar region  Muscle spasm of back  Muscle weakness (generalized)     Problem List Patient Active Problem List   Diagnosis Date Noted  . 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 Chronic daily headache 08/17/2016  . Chronic pelvic pain in female 06/27/2016  . Bilateral mastodynia 06/27/2016  . Galactorrhea, bilateral 06/27/2016  . Low back pain radiating to both legs 12/29/2015  . Convulsions ([place]) 06/08/2015  . Generalized anxiety disorder 06/08/2015  . Chronic pain syndrome 06/08/2015  . Migraine without aura and without status migrainosus, not intractable 06/08/2015  . 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 Seizure disorder ([place]) 05/28/2015  . Depression 05/28/2015  . Obstructive sleep apnea 05/28/2015  . Tobacco dependence 05/28/2015  . Neuropathy     Percival Spanish, PT, MPT 07/14/2020, 11:52 AM  [facility] [address]  [place], [place], [address] Phone: [phone]   Fax:  [phone]  Name: Angela Doyle MRN: [id number] Date of Birth: [birth year]

## 2020-07-16 ENCOUNTER — Encounter: Payer: Medicaid Other | Admitting: Physical Therapy

## 2020-07-21 ENCOUNTER — Ambulatory Visit: Payer: Medicaid Other | Admitting: Physical Therapy

## 2020-07-21 ENCOUNTER — Other Ambulatory Visit: Payer: Self-pay

## 2020-07-21 DIAGNOSIS — M5442 Lumbago with sciatica, left side: Secondary | ICD-10-CM

## 2020-07-21 DIAGNOSIS — M6281 Muscle weakness (generalized): Secondary | ICD-10-CM

## 2020-07-21 DIAGNOSIS — G8929 Other chronic pain: Secondary | ICD-10-CM

## 2020-07-21 DIAGNOSIS — M62830 Muscle spasm of back: Secondary | ICD-10-CM

## 2020-07-21 DIAGNOSIS — M5416 Radiculopathy, lumbar region: Secondary | ICD-10-CM

## 2020-07-21 NOTE — Therapy (Signed)
[facility] Outpatient Rehabilitation [facility] [address]  [address] [place], [place], [address] Phone: [phone]   Fax:  [phone]  Physical Therapy Treatment  Patient Details  Name: Angela Doyle MRN: [id number] Date of Birth: 1985-05-02 Referring Provider (PT): Carmel Sacramento, NP   Encounter Date: 07/21/2020   PT End of Session - 07/21/20 1023    Visit Number 15    Number of Visits 18    Date for PT Re-Evaluation --    Authorization Type UHC Medicaid - VL: 27    PT Start Time 1023   Pt arrived late - trsnportation issues   PT Stop Time 1102    PT Time Calculation (min) 39 min    Activity Tolerance Patient limited by pain;Patient tolerated treatment well    Behavior During Therapy [facility] for tasks assessed/performed           Past Medical History:  Diagnosis Date  . Chronic back pain   . Depression   . Fibromyalgia   . Headache    migraine  . Hypercholesterolemia   . Neuropathy   . Osteoarthritis   . 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 Seizure (HCC)    pseudo seizures  . TIA (transient ischemic attack)   . Vertigo     Past Surgical History:  Procedure Laterality Date  . CESAREAN SECTION  2011  . OVARIAN CYST SURGERY      There were no vitals filed for this visit.   Subjective Assessment - 07/21/20 1026    Subjective Pt reports she saw the MD regarding her R ankle pain - MD told her to try icing it. Pt also reports pain now in the bottom of her L foot which the MD told her was likely plantar fasciitis. Also notes in increased pain in both legs since the weekend - she attributes this to the weather.    Limitations Sitting;Standing;Walking;House hold activities;Lifting    How long can you sit comfortably? 10 minutes    How long can you stand comfortably? 10 minutes    How long can you walk comfortably? 10 minutes    Diagnostic tests Lumbar MRI 04/18/18: 1. No lumbar disc degeneration or acute osseous abnormality.  2. Widespread lumbar facet  hypertrophy. No lumbar spinal stenosis,  but up to mild bilateral neural foraminal stenosis at the L3 through  L5 nerve levels.    Patient Stated Goals "to strengthen my legs and to alleviate some pain in my back so I can walk and stand longer"    Currently in Pain? Yes    Pain Score 7     Pain Location Leg    Pain Descriptors / Indicators Aching;Burning    Pain Score 6    Pain Location Hip    Pain Orientation Right    Pain Descriptors / Indicators Sharp    Pain Score 4    Pain Location Ankle    Pain Orientation Right    Pain Descriptors / Indicators Sharp    Pain Score 4    Pain Location Foot    Pain Orientation Left    Pain Descriptors / Indicators Clance Boll PT Assessment - 07/21/20 1023      Assessment   Medical Diagnosis Lumbar radiculopathy    Referring Provider (PT) Carmel Sacramento, NP    Next MD Visit June 2022      Strength   Right Hip Flexion 4+/5  Right Hip Extension 2+/5   very limited ROM + pain   Right Hip External Rotation  4/5   pain   Right Hip Internal Rotation 4+/5   pain   Right Hip ABduction 4+/5    Right Hip ADduction 3-/5    Left Hip Flexion 5/5    Left Hip Extension 2+/5   very limited ROM   Left Hip External Rotation 4+/5    Left Hip Internal Rotation 4+/5    Left Hip ABduction 5/5    Left Hip ADduction 4-/5    Right Knee Flexion 4+/5    Right Knee Extension 4+/5    Left Knee Flexion 5/5    Left Knee Extension 5/5    Right Ankle Dorsiflexion 5/5    Left Ankle Dorsiflexion 5/5   discomfort                        [facility] Adult PT Treatment/Exercise - 07/21/20 1023      Exercises   Exercises Lumbar      Lumbar Exercises: Aerobic   Recumbent Bike L2 x 72min                  PT Education - 07/21/20 1102    Education Details HEP update (plantar faciitis stretches/STM) -Access Code: [email or web address]    Person(s) Educated Patient    Methods Explanation;Demonstration;Verbal cues;Handout    Comprehension  Verbalized understanding;Verbal cues required;Returned demonstration;Need further instruction            PT Short Term Goals - 06/24/20 0947      PT SHORT TERM GOAL #1   Title Patient will be independent with initial HEP    Status Achieved   06/01/20     PT SHORT TERM GOAL #2   Title Patient will verbalize/demonstrate understanding of neutral spine posture and proper body mechanics to reduce strain on lumbar spine    Status Achieved   06/22/20            PT Long Term Goals - 07/21/20 1038      PT LONG TERM GOAL #1   Title Patient will be independent with ongoing/advanced HEP for self-management at home    Status Partially Met   06/22/20 - met for current HEP     PT LONG TERM GOAL #2   Title Patient to demonstrate appropriate posture and body mechanics needed for daily activities    Status Achieved   06/22/20     PT LONG TERM GOAL #3   Title Patient to report reduction in frequency and intensity of LBP and LE radicular pain by >/= 25%    Status Partially Met   07/21/20 - 20-25% reduction in LBP and radicular symptoms     PT LONG TERM GOAL #4   Title Patient will demonstrate improved B LE strength to >/= 4+/5 for improved stability and ease of mobility    Status Partially Met      PT LONG TERM GOAL #5   Title Patient will improve standing and/or walking tolerance to >/=  20-30 minutes w/o pain interference to allow resumption of normal daily activities    Status On-going   07/21/20 - currently can tolerate ~10 minutes standing or walking w/o pain interference                Plan - 07/21/20 1102    Clinical Impression Statement Angela Doyle reports 20-25% improvement since start of PT with lessening of the sharp pain in  her legs while walking with improved walking tolerance before onset of pain in legs or back. Pain remains sharper at her R hip with more recent onset of pain noted in R ankle and L plantar fascia. Provided instructions in HEP to address plantar fasciitis. Angela Doyle  currently has one more scheduled PT visit and would like to take a break from PT with a 30-day hold to f/u with MD following comprehensive HEP review on final visit.    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; TIA    Rehab Potential Good    PT Frequency 2x / week    PT Duration 4 weeks    PT Treatment/Interventions ADLs/Self Care Home Management;Cryotherapy;Electrical Stimulation;Iontophoresis 4mg /ml Dexamethasone;Moist Heat;Traction;Ultrasound;Gait training;Stair training;Functional mobility training;Therapeutic activities;Therapeutic exercise;Balance training;Neuromuscular re-education;Patient/family education;Manual techniques;Passive range of motion;Dry needling;Taping;Spinal Manipulations    PT Next Visit Plan HEP review & consolidation with planned transition to HEP + 30-day hold    PT [place] Access Codes: [id number] 05/18/2022), [id number] (3/23);  [id number] (4/4); [email or web address] (5/24)    Consulted and Agree with Plan of Care Patient           Patient will benefit from skilled therapeutic intervention in order to improve the following deficits and impairments:  Decreased activity tolerance,Decreased endurance,Decreased knowledge of precautions,Decreased mobility,Decreased range of motion,Decreased safety awareness,Decreased strength,Difficulty walking,Hypomobility,Increased fascial restricitons,Increased muscle spasms,Impaired perceived functional ability,Impaired flexibility,Impaired sensation,Improper body mechanics,Postural dysfunction,Pain  Visit Diagnosis: Chronic bilateral low back pain with bilateral sciatica  Radiculopathy, lumbar region  Muscle spasm of back  Muscle weakness (generalized)     Problem  List Patient Active Problem List   Diagnosis Date Noted  . 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 Chronic daily headache 08/17/2016  . Chronic pelvic pain in female 06/27/2016  . Bilateral mastodynia 06/27/2016  . Galactorrhea, bilateral 06/27/2016  . Low back pain radiating to both legs 12/29/2015  . Convulsions ([place]) 06/08/2015  . Generalized anxiety disorder 06/08/2015  . Chronic pain syndrome 06/08/2015  . Migraine without aura and without status migrainosus, not intractable 06/08/2015  . Nonintractable epilepsy with status epilepticus ([place]) 05/28/2015  . Seizure disorder ([place]) 05/28/2015  . Depression 05/28/2015  . Obstructive sleep apnea 05/28/2015  . Tobacco dependence 05/28/2015  . Neuropathy     Percival Spanish, PT, MPT 07/21/2020, 12:44 PM  [facility] [address]  [place], [place], [address] Phone: [phone]   Fax:  [phone]  Name: Angela Doyle MRN: [id number] Date of Birth: 1985-11-04

## 2020-07-21 NOTE — Patient Instructions (Signed)
      Access Code: [email or web address] URL: [email or web address] Date: 07/21/2020 Prepared by: Glenetta Hew  Exercises Long Sitting Plantar Fascia Stretch with Towel - 2-3 x daily - 7 x weekly - 3 reps - 30 sec hold Seated Plantar Fascia Stretch - 2-3 x daily - 7 x weekly - 3 reps - 30 sec hold Plantar Fascia Stretch on Step - 2-3 x daily - 7 x weekly - 3 reps - 30 sec hold Gastroc Stretch with Foot at Wall - 2-3 x daily - 7 x weekly - 3 reps - 30 sec hold Seated Plantar Fascia Mobilization with Small Ball - 2-3 x daily - 7 x weekly - 1-2 min hold Foot Roller Plantar Massage - 2-3 x daily - 7 x weekly - 2-3 min hold

## 2020-07-23 ENCOUNTER — Encounter: Payer: Self-pay | Admitting: Physical Therapy

## 2020-07-23 ENCOUNTER — Ambulatory Visit: Payer: Medicaid Other | Admitting: Physical Therapy

## 2020-07-23 ENCOUNTER — Other Ambulatory Visit: Payer: Self-pay

## 2020-07-23 DIAGNOSIS — M5442 Lumbago with sciatica, left side: Secondary | ICD-10-CM | POA: Diagnosis not present

## 2020-07-23 DIAGNOSIS — M5416 Radiculopathy, lumbar region: Secondary | ICD-10-CM

## 2020-07-23 DIAGNOSIS — M62830 Muscle spasm of back: Secondary | ICD-10-CM

## 2020-07-23 DIAGNOSIS — M6281 Muscle weakness (generalized): Secondary | ICD-10-CM

## 2020-07-23 DIAGNOSIS — G8929 Other chronic pain: Secondary | ICD-10-CM

## 2020-07-23 NOTE — Patient Instructions (Signed)
          Access Code: [email or web address] URL: [email or web address] Date: 07/23/2020 Prepared by: Glenetta Hew  Exercises Sidelying Thoracic Lumbar Rotation - 2-3 x daily - 7 x weekly - 2 sets - 10 reps - 3 hold Hooklying Clamshell with Resistance - 1 x daily - 7 x weekly - 2 sets - 10 reps - 3-5 sec hold Cat-Camel - 2 x daily - 7 x weekly - 2 sets - 10 reps - 5 sec hold Seated Table Piriformis Stretch - 2-3 x daily - 7 x weekly - 3 reps - 30 sec hold Seated Table Hamstring Stretch - 2-3 x daily - 7 x weekly - 3 reps - 30 sec hold Seated Quadratus Lumborum Stretch in Chair - 2-3 x daily - 7 x weekly - 3 reps - 30 sec hold Seated Piriformis Stretch - 2-3 x daily - 7 x weekly - 2-3 sets - 1 reps - 30 seconds hold Seated Pelvic Tilts - 2 x daily - 7 x weekly - 2 sets - 10 reps - 5 sec hold Sit to Stand with Resistance Around Legs - 1 x daily - 7 x weekly - 1-2 sets - 10 reps Standing with Back Flat Against Wall - 2-3 x daily - 7 x weekly - 1-2 sets - 10 reps - 5 second holds hold Gastroc Stretch on Wall - 2-3 x daily - 7 x weekly - 3 reps - 30 sec hold  Patient Education Trigger Point Dry Needling [place] Patient Instructions

## 2020-07-23 NOTE — Therapy (Addendum)
[facility] [address]  [place], [place], [address] Phone: [phone]   Fax:  [phone]  Physical Therapy Treatment / Progress Note / Discharge Summary  Patient Details  Name: Angela Doyle MRN: [id number] Date of Birth: 11-05-1985 Referring Provider (PT): Emelia Loron, NP   Encounter Date: 07/23/2020   PT End of Session - 07/23/20 1019     Visit Number 16    Number of Visits 18    Authorization Type [employer] Medicaid - VL: 27    PT Start Time 1019    PT Stop Time 1101    PT Time Calculation (min) 42 min    Activity Tolerance Patient limited by pain;Patient tolerated treatment well    Behavior During Therapy [facility] for tasks assessed/performed             Past Medical History:  Diagnosis Date   Chronic back pain    Depression    Fibromyalgia    Headache    migraine   Hypercholesterolemia    Neuropathy    Osteoarthritis    Pseudoseizures ([place])    Seizure ([place])    pseudo seizures   TIA (transient ischemic attack)    Vertigo     Past Surgical History:  Procedure Laterality Date   CESAREAN SECTION  2011   OVARIAN CYST SURGERY      There were no vitals filed for this visit.   Subjective Assessment - 07/23/20 1021     Subjective Pt reports increased stiffness today due to the inclement weather.    Limitations Sitting;Standing;Walking;House hold activities;Lifting    How long can you sit comfortably? 10 minutes    How long can you stand comfortably? 10 minutes    How long can you walk comfortably? 10 minutes    Diagnostic tests Lumbar MRI 04/18/18: 1. No lumbar disc degeneration or acute osseous abnormality.  2. Widespread lumbar facet hypertrophy. No lumbar spinal stenosis,  but up to mild bilateral neural foraminal stenosis at the L3 through  L5 nerve levels.    Patient Stated Goals "to strengthen my legs and to alleviate some pain in my back so I can walk and stand longer"    Currently in Pain? Yes     Pain Score 7     Pain Location Leg    Pain Orientation Right    Pain Descriptors / Indicators Burning;Aching    Pain Type Acute pain    Aggravating Factors  walking, standing, cleaning house, AC or cold weather    Pain Relieving Factors heating blanket    Pain Score 7    Pain Location Hip    Pain Orientation Right    Pain Descriptors / Indicators Sharp    Pain Type Acute pain    Aggravating Factors  sitting or laying down, walking                [facility] PT Assessment - 07/23/20 1019       Assessment   Medical Diagnosis Lumbar radiculopathy    Referring Provider (PT) Emelia Loron, NP    Next MD Visit June 2022      Strength   Right Hip Flexion 4+/5    Right Hip Extension 2+/5   very limited ROM + pain   Right Hip External Rotation  4/5   pain   Right Hip Internal Rotation 4+/5   pain   Right Hip ABduction 4+/5    Right Hip ADduction 3-/5  Left Hip Flexion 5/5    Left Hip Extension 2+/5   very limited ROM   Left Hip External Rotation 4+/5    Left Hip Internal Rotation 4+/5    Left Hip ABduction 5/5    Left Hip ADduction 4-/5    Right Knee Flexion 4+/5    Right Knee Extension 4+/5    Left Knee Flexion 5/5    Left Knee Extension 5/5    Right Ankle Dorsiflexion 5/5    Left Ankle Dorsiflexion 5/5   discomfort                          [facility] Adult PT Treatment/Exercise - 07/23/20 1019       Lumbar Exercises: Stretches   Passive Hamstring Stretch Right;2 reps;30 seconds    Passive Hamstring Stretch Limitations long-sitting hip hinge    Standing Side Bend Right;2 reps;30 seconds    Standing Side Bend Limitations seated QL/ITB stretch with leg lowered over side of chair    Piriformis Stretch Right;2 reps;30 seconds    Piriformis Stretch Limitations seated KTOS    Figure 4 Stretch 2 reps;30 seconds;Seated    Figure 4 Stretch Limitations side-sitting figure-4    Other Lumbar Stretch Exercise Open book stretch 10 x 5"      Lumbar Exercises:  Standing   Other Standing Lumbar Exercises PPT against wall with small mini squat to reduce pressure on lumbar spine 10x5"      Lumbar Exercises: Seated   Sit to Stand 10 reps    Sit to Stand Limitations TrA activation with exhale prior to initiating standing and sitting + green TB hip ABD isometric    Other Seated Lumbar Exercises PPT 10 x 5"      Lumbar Exercises: Supine   Clam 10 reps;5 seconds    Clam Limitations green TB    Bridge 10 reps;5 seconds    Bridge Limitations + green TB isometric; deferred from HEP d/t R hip discomfort      Lumbar Exercises: Quadruped   Madcat/Old Horse 10 reps    Madcat/Old Horse Limitations 5" hold                    PT Education - 07/23/20 1101     Education Details HEP review & consolidation (excluding plantar fasciits HEP) - Access Code: [email or web address]              PT Short Term Goals - 06/24/20 0947       PT SHORT TERM GOAL #1   Title Patient will be independent with initial HEP    Status Achieved   06/01/20     PT SHORT TERM GOAL #2   Title Patient will verbalize/demonstrate understanding of neutral spine posture and proper body mechanics to reduce strain on lumbar spine    Status Achieved   06/22/20              PT Long Term Goals - 07/23/20 1026       PT LONG TERM GOAL #1   Title Patient will be independent with ongoing/advanced HEP for self-management at home    Status Achieved   07/23/20     PT LONG TERM GOAL #2   Title Patient to demonstrate appropriate posture and body mechanics needed for daily activities    Status Achieved   06/22/20     PT LONG TERM GOAL #3   Title Patient to report reduction in frequency and intensity  of LBP and LE radicular pain by >/= 25%    Status Partially Met   07/21/20 - 20-25% reduction in LBP and radicular symptoms     PT LONG TERM GOAL #4   Title Patient will demonstrate improved B LE strength to >/= 4+/5 for improved stability and ease of mobility    Status Partially Met       PT LONG TERM GOAL #5   Title Patient will improve standing and/or walking tolerance to >/=  20-30 minutes w/o pain interference to allow resumption of normal daily activities    Status Not Met   07/21/20 - currently can tolerate ~10 minutes standing or walking w/o pain interference                  Plan - 07/23/20 1025     Clinical Impression Statement Angela Doyle reports 20-25% improvement since start of PT with lessening of the sharp pain in her legs while walking with improved walking tolerance before onset of pain in legs or back. Pain remains sharper at her R hip with more recent onset of pain noted in R ankle and L plantar fascia. She notes good initial benefit from HEP exercises to address plantar fasciitis and denies need for review of these exercises. Session focusing on review and consolidation of exercises for hip pain, LBP and radiculopathy with pt reporting good comfort with revised HEP. All STGs met and LTGs mostly met or partially met with exception of walking and standing tolerance. Angela Doyle would like to try transitioning to the HEP with a 30-day hold for PT to conserve her Medicaid visits while she arranges to f/u with MD.    Comorbidities UE/LE peripheral neuropathy; fibromyalgia; chronic pain syndrome; OA; migraines, OSA, anxiety; conversion disorder with seizures/pseudoseizures or convulsions; TIA    PT Treatment/Interventions ADLs/Self Care Home Management;Cryotherapy;Electrical Stimulation;Iontophoresis 81m/ml Dexamethasone;Moist Heat;Traction;Ultrasound;Gait training;Stair training;Functional mobility training;Therapeutic activities;Therapeutic exercise;Balance training;Neuromuscular re-education;Patient/family education;Manual techniques;Passive range of motion;Dry needling;Taping;Spinal Manipulations    PT Next Visit Plan transition to HEP + 30-day hold    PT HHulbertAccess Codes: [id number] (05/16/22, [id number](3/23);  [id number] (4/4); [id number](5/24)     Consulted and Agree with Plan of Care Patient             Patient will benefit from skilled therapeutic intervention in order to improve the following deficits and impairments:  Decreased activity tolerance,Decreased endurance,Decreased knowledge of precautions,Decreased mobility,Decreased range of motion,Decreased safety awareness,Decreased strength,Difficulty walking,Hypomobility,Increased fascial restricitons,Increased muscle spasms,Impaired perceived functional ability,Impaired flexibility,Impaired sensation,Improper body mechanics,Postural dysfunction,Pain  Visit Diagnosis: Chronic bilateral low back pain with bilateral sciatica  Radiculopathy, lumbar region  Muscle spasm of back  Muscle weakness (generalized)     Problem List Patient Active Problem List   Diagnosis Date Noted   Conversion disorder with seizures or convulsions 08/17/2016   Chronic daily headache 08/17/2016   Chronic pelvic pain in female 06/27/2016   Bilateral mastodynia 06/27/2016   Galactorrhea, bilateral 06/27/2016   Low back pain radiating to both legs 12/29/2015   Convulsions ([place] 06/08/2015   Generalized anxiety disorder 06/08/2015   Chronic pain syndrome 06/08/2015   Migraine without aura and without status migrainosus, not intractable 06/08/2015   Nonintractable epilepsy with status epilepticus ([facility] 05/28/2015   Seizure disorder ([place] 05/28/2015   Depression 05/28/2015   Obstructive sleep apnea 05/28/2015   Tobacco dependence 05/28/2015   Neuropathy     JPercival Spanish PT, MPT 07/23/2020, 1:04 PM  [facility] [address]  [place], [place], [address] Phone: [phone]   Fax:  [phone]  Name: Angela Doyle MRN: [id number] Date of Birth: 03/03/1985    PHYSICAL THERAPY DISCHARGE SUMMARY  Visits from Start of Care: 16  Current functional level related to goals / functional outcomes:   Refer to above clinical  impression for status as of last visit on 07/23/2020. Patient was placed on hold for 30 days pending f/u with MD and has not returned to PT in >30 days, therefore will proceed with discharge from PT for this episode.   Remaining deficits:   As above.   Education / Equipment:   HEP, Biomedical scientist education   Patient agrees to discharge. Patient goals were partially met. Patient is being discharged due to not returning since the last visit.   Percival Spanish, PT, MPT 09/04/20, 10:34 AM  [facility] [address]  [place], [place], [address] Phone: [phone]   Fax:  [phone]

## 2020-07-28 ENCOUNTER — Ambulatory Visit: Payer: Medicaid Other | Admitting: Obstetrics

## 2020-07-31 ENCOUNTER — Other Ambulatory Visit: Payer: Self-pay

## 2020-07-31 ENCOUNTER — Encounter: Payer: Self-pay | Admitting: Obstetrics

## 2020-07-31 ENCOUNTER — Other Ambulatory Visit (HOSPITAL_COMMUNITY)
Admission: RE | Admit: 2020-07-31 | Discharge: 2020-07-31 | Disposition: A | Discharge status: Home / Self Care | Payer: Medicaid Other | Source: Ambulatory Visit | Attending: Obstetrics | Admitting: Obstetrics

## 2020-07-31 ENCOUNTER — Ambulatory Visit (INDEPENDENT_AMBULATORY_CARE_PROVIDER_SITE_OTHER): Payer: Medicaid Other | Admitting: Obstetrics

## 2020-07-31 VITALS — BP 141/87 | HR 77 | Ht 62.0 in | Wt 259.2 lb

## 2020-07-31 DIAGNOSIS — J301 Allergic rhinitis due to pollen: Secondary | ICD-10-CM

## 2020-07-31 DIAGNOSIS — N939 Abnormal uterine and vaginal bleeding, unspecified: Secondary | ICD-10-CM | POA: Insufficient documentation

## 2020-07-31 DIAGNOSIS — N946 Dysmenorrhea, unspecified: Secondary | ICD-10-CM

## 2020-07-31 MED ORDER — LORATADINE 10 MG PO TABS: 10.0000 mg | ORAL_TABLET | Freq: Every day | ORAL | 11 refills | Status: AC

## 2020-07-31 MED ORDER — IBUPROFEN 800 MG PO TABS: 800.0000 mg | ORAL_TABLET | Freq: Three times a day (TID) | ORAL | 5 refills | Status: DC | PRN

## 2020-07-31 NOTE — Progress Notes (Signed)
Patient ID: Angela Doyle, female   DOB: [birth year], 35 y.o.   MRN: [id number]  Chief Complaint  Patient presents with  . GYN    HPI Angela Doyle is a 35 y.o. female.  Complaint of onset of vaginal bleeding while in bed.  Denies cramping, dysuria, diarrhea or constipation. HPI  Past Medical History:  Diagnosis Date  . Chronic back pain   . Depression   . Fibromyalgia   . Headache    migraine  . Hypercholesterolemia   . Neuropathy   . Osteoarthritis   . 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 Seizure ([facility])    pseudo seizures  . TIA (transient ischemic attack)   . Vertigo     Past Surgical History:  Procedure Laterality Date  . CESAREAN SECTION  2011  . OVARIAN CYST SURGERY      Family History  Problem Relation Age of Onset  . Neuropathy Mother   . Arthritis Mother   . Hypertension Mother   . Heart disease Maternal Grandmother   . Diabetes Maternal Grandmother   . Breast cancer Maternal Aunt     Social History Social History   Tobacco Use  . Smoking status: Light Tobacco Smoker    Packs/day: 0.25    Types: Cigarettes  . Smokeless tobacco: Never Used  . Tobacco comment: 4-5 per day   Vaping Use  . Vaping Use: Never used  Substance Use Topics  . Alcohol use: Yes    Alcohol/week: 0.0 standard drinks    Comment: occ  . Drug use: No    Allergies  Allergen Reactions  . Pineapple Swelling    Oral swelling    Current Outpatient Medications  Medication Sig Dispense Refill  . allopurinol (ZYLOPRIM) 100 MG tablet TK 1 T PO D    . loratadine (CLARITIN) 10 MG tablet Take 1 tablet (10 mg total) by mouth daily. 30 tablet 11  . methocarbamol (ROBAXIN) 750 MG tablet TK 1 T PO Q 12 H PRN    . oxyCODONE-acetaminophen (PERCOCET) 10-325 MG tablet TK 1 T PO TID    . pregabalin (LYRICA) 200 MG capsule Take 1 capsule (200 mg total) by mouth 2 (two) times daily for 30 days. 60 capsule 0  . Prenat w/o A-FeCbn-Meth-FA-DHA (PRENATE MINI) 29-0.6-0.4-350 MG CAPS Take 1 capsule by mouth  daily before breakfast. 30 capsule 11  . tiZANidine (ZANAFLEX) 4 MG tablet TK 1 T PO TID PRN    . butalbital-acetaminophen-caffeine (FIORICET) 50-325-40 MG tablet TK 1-2 CAPLETS PO EVERY 6 HOURS PRN FOR HEADACHES (Patient not taking: Reported on 04/29/2020)    . DULoxetine (CYMBALTA) 60 MG capsule TK ONE C PO D (Patient not taking: Reported on 04/29/2020)    . ibuprofen (ADVIL) 800 MG tablet Take 1 tablet (800 mg total) by mouth every 8 (eight) hours as needed. 30 tablet 5  . Levonorgest-Eth Estrad-Fe Bisg (BALCOLTRA) 0.1-20 MG-MCG(21) TABS Take 1 tablet by mouth daily. (Patient not taking: Reported on 04/29/2020) 28 tablet 11  . metroNIDAZOLE (FLAGYL) 500 MG tablet Take 1 tablet (500 mg total) by mouth 2 (two) times daily. (Patient not taking: Reported on 07/31/2020) 14 tablet 2  . rizatriptan (MAXALT-MLT) 10 MG disintegrating tablet Take 1 tablet (10 mg total) by mouth as needed for migraine. May repeat in 2 hours if needed (Patient not taking: No sig reported) 9 tablet 11  . topiramate (TOPAMAX) 50 MG tablet Take 1 tablet (50 mg total) by mouth 2 (two) times daily. (Patient not taking: Reported on 07/31/2020)  60 tablet 12  . traZODone (DESYREL) 50 MG tablet as needed.    Marland Kitchen XTAMPZA ER 9 MG C12A TK 1 C PO BID (Patient not taking: No sig reported)     No current facility-administered medications for this visit.    Review of Systems Review of Systems Constitutional: negative for fatigue and weight loss Respiratory: negative for cough and wheezing Cardiovascular: negative for chest pain, fatigue and palpitations Gastrointestinal: negative for abdominal pain and change in bowel habits Genitourinary: positive for vaginal discharge and vaginal bleeding Integument/breast: negative for nipple discharge Musculoskeletal:negative for myalgias Neurological: negative for gait problems and tremors Behavioral/Psych: negative for abusive relationship, depression Endocrine: negative for temperature intolerance       Blood pressure (!) 141/87, pulse 77, height 5\' 2"  (1.575 m), weight 259 lb 3.2 oz (117.6 kg), last menstrual period 07/05/2020.  Physical Exam Physical Exam General:   alert and no distress  Skin:   no rash or abnormalities  Lungs:   clear to auscultation bilaterally  Heart:   regular rate and rhythm, S1, S2 normal, no murmur, click, rub or gallop  Breasts:   not examined  Abdomen:  normal findings: no organomegaly, soft, non-tender and no hernia  Pelvis:  External genitalia: normal general appearance Urinary system: urethral meatus normal and bladder without fullness, nontender Vaginal: normal without tenderness, induration or masses Cervix: normal appearance Adnexa: normal bimanual exam Uterus: anteverted and non-tender, normal size    I have spent a total of 20 minutes of face-to-face time, excluding clinical staff time, reviewing notes and preparing to see patient, ordering tests and/or medications, and counseling the patient.  Data Reviewed Wet Prep Cultures  Assessment     1. Abnormal uterine bleeding (AUB) Rx: - 09/04/2020 PELVIC COMPLETE WITH TRANSVAGINAL; Future - Cervicovaginal ancillary only( [place])  2. Non-seasonal allergic rhinitis due to pollen Rx: - loratadine (CLARITIN) 10 MG tablet; Take 1 tablet (10 mg total) by mouth daily.  Dispense: 30 tablet; Refill: 11  3. Dysmenorrhea Rx: - ibuprofen (ADVIL) 800 MG tablet; Take 1 tablet (800 mg total) by mouth every 8 (eight) hours as needed.  Dispense: 30 tablet; Refill: 5    Plan   Follow up in 2 weeks  Orders Placed This Encounter  Procedures  . US PELVIC COMPLETE WITH TRANSVAGINAL    Standing Status:   Future    Standing Expiration Date:   07/31/2021    Order Specific Question:   Reason for Exam (SYMPTOM  OR DIAGNOSIS REQUIRED)    Answer:   AUB    Order Specific Question:   Preferred imaging location?    Answer:   [facility]   Meds ordered this encounter  Medications  . loratadine (CLARITIN) 10  MG tablet    Sig: Take 1 tablet (10 mg total) by mouth daily.    Dispense:  30 tablet    Refill:  11  . ibuprofen (ADVIL) 800 MG tablet    Sig: Take 1 tablet (800 mg total) by mouth every 8 (eight) hours as needed.    Dispense:  30 tablet    Refill:  5     09/30/2021, MD 07/31/2020 1:24 PM

## 2020-07-31 NOTE — Progress Notes (Signed)
Pt is in the office for GYN visit. Reports waking on 07-28-20 to a gush of bright red blood and some clots.  Denies any pain or cramping LMP 07-05-20 Last pap 04-17-20

## 2020-08-03 LAB — CERVICOVAGINAL ANCILLARY ONLY
Bacterial Vaginitis (gardnerella): POSITIVE — AB
Candida Glabrata: NEGATIVE
Candida Vaginitis: NEGATIVE
Chlamydia: NEGATIVE
Comment: NEGATIVE
Comment: NEGATIVE
Comment: NEGATIVE
Comment: NEGATIVE
Comment: NEGATIVE
Comment: NORMAL
Neisseria Gonorrhea: NEGATIVE
Trichomonas: NEGATIVE

## 2020-08-04 ENCOUNTER — Other Ambulatory Visit: Payer: Self-pay | Admitting: Obstetrics

## 2020-08-04 DIAGNOSIS — B9689 Other specified bacterial agents as the cause of diseases classified elsewhere: Secondary | ICD-10-CM

## 2020-08-04 MED ORDER — METRONIDAZOLE 500 MG PO TABS: 500.0000 mg | ORAL_TABLET | Freq: Two times a day (BID) | ORAL | 2 refills | Status: DC

## 2020-08-11 ENCOUNTER — Ambulatory Visit (HOSPITAL_COMMUNITY): Admission: RE | Admit: 2020-08-11 | Payer: Medicaid Other | Source: Ambulatory Visit

## 2020-08-14 ENCOUNTER — Telehealth: Payer: Medicaid Other | Admitting: Obstetrics

## 2020-12-25 ENCOUNTER — Ambulatory Visit: Payer: Medicaid Other | Admitting: Neurology

## 2020-12-25 ENCOUNTER — Other Ambulatory Visit: Payer: Self-pay

## 2020-12-25 ENCOUNTER — Encounter: Payer: Self-pay | Admitting: Neurology

## 2020-12-25 VITALS — BP 157/89 | HR 99 | Ht 62.0 in | Wt 273.6 lb

## 2020-12-25 DIAGNOSIS — R531 Weakness: Secondary | ICD-10-CM | POA: Diagnosis not present

## 2020-12-25 DIAGNOSIS — R202 Paresthesia of skin: Secondary | ICD-10-CM

## 2020-12-25 DIAGNOSIS — G5622 Lesion of ulnar nerve, left upper limb: Secondary | ICD-10-CM

## 2020-12-25 DIAGNOSIS — G43009 Migraine without aura, not intractable, without status migrainosus: Secondary | ICD-10-CM

## 2020-12-25 MED ORDER — RIZATRIPTAN BENZOATE 10 MG PO TBDP: 10.0000 mg | ORAL_TABLET | ORAL | 11 refills | Status: DC | PRN

## 2020-12-25 MED ORDER — TOPIRAMATE 50 MG PO TABS: 50.0000 mg | ORAL_TABLET | Freq: Two times a day (BID) | ORAL | 12 refills | Status: DC
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
## 2020-12-25 NOTE — Patient Instructions (Addendum)
Schedule EMG/NCV of both UE and LE  2. Restart Topiramate 50mg  twice a day and as needed Maxalt for migraines  3. Follow-up in 6 months, call for any changes

## 2021-01-19 ENCOUNTER — Other Ambulatory Visit: Payer: Self-pay

## 2021-01-19 ENCOUNTER — Ambulatory Visit: Payer: Medicaid Other | Admitting: Neurology

## 2021-01-19 DIAGNOSIS — R531 Weakness: Secondary | ICD-10-CM

## 2021-01-19 DIAGNOSIS — G43009 Migraine without aura, not intractable, without status migrainosus: Secondary | ICD-10-CM

## 2021-01-19 DIAGNOSIS — R202 Paresthesia of skin: Secondary | ICD-10-CM

## 2021-01-19 DIAGNOSIS — G5622 Lesion of ulnar nerve, left upper limb: Secondary | ICD-10-CM

## 2021-01-19 NOTE — Procedures (Signed)
[facility]  [address] [place], [address]  [place], [place] [address] Tel: [phone] Fax:  [phone] Test Date:  01/19/2021  Patient: Angela Doyle DOB: 1985-07-03 Physician: Nita Sickle, DO  Sex: Female Height: 5\' 2"  Ref Phys: , M.D.  ID#: Patrcia Dolly   Technician:    Patient Complaints: This is a 35 year old female referred for evaluation of generalized paresthesias of the legs.  NCV & EMG Findings: Electrodiagnostic testing of the right lower extremity and additional studies of the left shows: Bilateral sural and superficial peroneal sensory responses are within normal limits. Bilateral peroneal and tibial motor responses are within normal limits. Bilateral tibial H reflex studies are within normal limits. There is no evidence of active or chronic motor axonal changes affecting any of the tested muscles.  Motor unit configuration and recruitment pattern is within normal limits.  Impression: This is a normal study of the lower extremities.  In particular, there is no evidence of a sensorimotor polyneuropathy or lumbosacral radiculopathy.   ___________________________ 34, DO    Nerve Conduction Studies Anti Sensory Summary Table   Stim Site NR Peak (ms) Norm Peak (ms) P-T Amp (V) Norm P-T Amp  Left Sup Peroneal Anti Sensory (Ant Lat Mall)  32C  12 cm    1.8 <4.5 20.6 >5  Right Sup Peroneal Anti Sensory (Ant Lat Mall)  32C  12 cm    1.8 <4.5 20.4 >5  Left Sural Anti Sensory (Lat Mall)  32C  Calf    2.6 <4.5 15.2 >5  Right Sural Anti Sensory (Lat Mall)  32C  Calf    2.9 <4.5 10.7 >5   Motor Summary Table   Stim Site NR Onset (ms) Norm Onset (ms) O-P Amp (mV) Norm O-P Amp Site1 Site2 Delta-0 (ms) Dist (cm) Vel (m/s) Norm Vel (m/s)  Left Peroneal Motor (Ext Dig Brev)  32C  Ankle    2.7 <5.5 3.1 >3 B Fib Ankle 7.1 38.0 54 >40  B Fib    9.8  2.7  Poplt B Fib 1.4 8.0 57 >40  Poplt    11.2  2.6         Right Peroneal Motor (Ext Dig  Brev)  32C  Ankle    4.0 <5.5 3.8 >3 B Fib Ankle 7.8 40.0 51 >40  B Fib    11.8  3.8  Poplt B Fib 1.5 9.0 60 >40  Poplt    13.3  3.8         Left Tibial Motor (Abd Hall Brev)  32C  Ankle    4.4 <6.0 9.2 >8 Knee Ankle 7.6 41.0 54 >40  Knee    12.0  4.6         Right Tibial Motor (Abd Hall Brev)  32C  Ankle    4.9 <6.0 8.5 >8 Knee Ankle 6.7 41.0 61 >40  Knee    11.6  8.5          H Reflex Studies   NR H-Lat (ms) Lat Norm (ms) L-R H-Lat (ms)  Left Tibial (Gastroc)  32C     32.79 <35 0.00  Right Tibial (Gastroc)  32C     32.79 <35 0.00   EMG   Side Muscle Ins Act Fibs Psw Fasc Number Recrt Dur Dur. Amp Amp. Poly Poly. Comment  Left AntTibialis Nml Nml Nml Nml Nml Nml Nml Nml Nml Nml Nml Nml N/A  Left Gastroc Nml Nml Nml Nml Nml Nml Nml Nml Nml Nml Nml  Nml N/A  Left Flex Dig Long Nml Nml Nml Nml Nml Nml Nml Nml Nml Nml Nml Nml N/A  Left RectFemoris Nml Nml Nml Nml Nml Nml Nml Nml Nml Nml Nml Nml N/A  Left BicepsFemS Nml Nml Nml Nml Nml Nml Nml Nml Nml Nml Nml Nml N/A  Right BicepsFemS Nml Nml Nml Nml Nml Nml Nml Nml Nml Nml Nml Nml N/A  Right AntTibialis Nml Nml Nml Nml Nml Nml Nml Nml Nml Nml Nml Nml N/A  Right Gastroc Nml Nml Nml Nml Nml Nml Nml Nml Nml Nml Nml Nml N/A  Right Flex Dig Long Nml Nml Nml Nml Nml Nml Nml Nml Nml Nml Nml Nml N/A  Right RectFemoris Nml Nml Nml Nml Nml Nml Nml Nml Nml Nml Nml Nml N/A      Waveforms:

## 2021-01-20 ENCOUNTER — Telehealth: Payer: Self-pay

## 2021-01-20 NOTE — Telephone Encounter (Signed)
-----   Message from Van Clines, MD sent at 01/20/2021 11:07 AM EST ----- Pls let her know the nerve and muscle test on her legs was normal. Proceed with test for the arms as scheduled, thanks

## 2021-01-20 NOTE — Telephone Encounter (Signed)
Pt called no answer left a voice per DPR the nerve and muscle test on her legs was normal. Proceed with test for the arms as scheduled. Any question call the office back at [phone]

## 2021-01-28 ENCOUNTER — Encounter: Payer: Medicaid Other | Admitting: Neurology

## 2021-02-11 ENCOUNTER — Encounter: Payer: Medicaid Other | Admitting: Neurology

## 2021-02-18 ENCOUNTER — Ambulatory Visit: Payer: Medicaid Other | Admitting: Neurology

## 2021-02-18 ENCOUNTER — Other Ambulatory Visit: Payer: Self-pay

## 2021-02-18 DIAGNOSIS — R202 Paresthesia of skin: Secondary | ICD-10-CM | POA: Diagnosis not present

## 2021-02-18 DIAGNOSIS — G5622 Lesion of ulnar nerve, left upper limb: Secondary | ICD-10-CM

## 2021-02-18 NOTE — Procedures (Signed)
[facility]  [address] [place], [address]  [place], [place] [address] Tel: [phone] Fax:  [phone] Test Date:  02/18/2021  Patient: Angela Doyle DOB: 1986-01-02 Physician: Nita Sickle, DO  Sex: Female Height: 5\' 2"  Ref Phys: , M.D.  ID#: Patrcia Dolly   Technician:    Patient Complaints: This is a 35 year old female referred for evaluation of bilateral arm paresthesias.  NCV & EMG Findings: Extensive electrodiagnostic testing of the right upper extremity and additional studies of the left shows: Bilateral median, ulnar, and mixed palmar sensory responses are within normal limits. Bilateral median and right ulnar motor responses are within normal limits.  Left ulnar motor response shows slowed conduction velocity across the elbow. There is no evidence of active or chronic motor axonal loss changes affecting any of the tested muscles.  Motor unit configuration and recruitment pattern is within normal limits.  Impression: Left ulnar neuropathy with slowing across the elbow, purely demyelinating, mild.  Compared to prior study on 01/28/2016, there has been some improvement. There is no evidence of carpal tunnel syndrome or cervical radiculopathy affecting either upper extremity.   ___________________________ 01/30/2016, DO    Nerve Conduction Studies Anti Sensory Summary Table   Stim Site NR Peak (ms) Norm Peak (ms) P-T Amp (V) Norm P-T Amp  Left Median Anti Sensory (2nd Digit)  34C  Wrist    2.8 <3.4 47.7 >20  Right Median Anti Sensory (2nd Digit)  Wrist    2.8 <3.4 52.4 >20  Left Ulnar Anti Sensory (5th Digit)  34C  Wrist    2.7 <3.1 23.9 >12  Right Ulnar Anti Sensory (5th Digit)  34C  Wrist    2.6 <3.1 31.5 >12   Motor Summary Table   Stim Site NR Onset (ms) Norm Onset (ms) O-P Amp (mV) Norm O-P Amp Site1 Site2 Delta-0 (ms) Dist (cm) Vel (m/s) Norm Vel (m/s)  Left Median Motor (Abd Poll Brev)  34C  Wrist    2.7 <3.9 16.2 >6 Elbow Wrist  4.3 26.0 60 >50  Elbow    7.0  16.1         Right Median Motor (Abd Poll Brev)  34C  Wrist    2.7 <3.9 19.0 >6 Elbow Wrist 4.1 26.0 63 >50  Elbow    6.8  18.3         Left Ulnar Motor (Abd Dig Minimi)  34C  Wrist    1.6 <3.1 7.4 >7 B Elbow Wrist 4.2 22.0 52 >50  B Elbow    5.8  7.2  A Elbow B Elbow 2.2 10.0 45 >50  A Elbow    8.0  7.1         Right Ulnar Motor (Abd Dig Minimi)  34C  Wrist    1.9 <3.1 9.6 >7 B Elbow Wrist 3.7 23.0 62 >50  B Elbow    5.6  9.5  A Elbow B Elbow 1.9 10.0 53 >50  A Elbow    7.5  9.0          Comparison Summary Table   Stim Site NR Peak (ms) Norm Peak (ms) P-T Amp (V) Site1 Site2 Delta-P (ms) Norm Delta (ms)  Left Median/Ulnar Palm Comparison (Wrist - 8cm)  34C  Median Palm    1.8 <2.2 114.7 Median Palm Ulnar Palm 0.2   Ulnar Palm    1.6 <2.2 13.0      Right Median/Ulnar Palm Comparison (Wrist - 8cm)  34C  Median Nita Sickle  1.6 <2.2 62.6 Median Palm Ulnar Palm 0.1   Ulnar Palm    1.5 <2.2 8.2       EMG   Side Muscle Ins Act Fibs Psw Fasc Number Recrt Dur Dur. Amp Amp. Poly Poly. Comment  Right 1stDorInt Nml Nml Nml Nml Nml Nml Nml Nml Nml Nml Nml Nml N/A  Right PronatorTeres Nml Nml Nml Nml Nml Nml Nml Nml Nml Nml Nml Nml N/A  Right Biceps Nml Nml Nml Nml Nml Nml Nml Nml Nml Nml Nml Nml N/A  Right Triceps Nml Nml Nml Nml Nml Nml Nml Nml Nml Nml Nml Nml N/A  Right Deltoid Nml Nml Nml Nml Nml Nml Nml Nml Nml Nml Nml Nml N/A  Left 1stDorInt Nml Nml Nml Nml Nml Nml Nml Nml Nml Nml Nml Nml N/A  Left PronatorTeres Nml Nml Nml Nml Nml Nml Nml Nml Nml Nml Nml Nml N/A  Left Biceps Nml Nml Nml Nml Nml Nml Nml Nml Nml Nml Nml Nml N/A  Left Triceps Nml Nml Nml Nml Nml Nml Nml Nml Nml Nml Nml Nml N/A  Left Deltoid Nml Nml Nml Nml Nml Nml Nml Nml Nml Nml Nml Nml N/A  Left FlexCarpiUln Nml Nml Nml Nml Nml Nml Nml Nml Nml Nml Nml Nml N/A      Waveforms:

## 2021-05-20 ENCOUNTER — Emergency Department (HOSPITAL_BASED_OUTPATIENT_CLINIC_OR_DEPARTMENT_OTHER): Payer: Medicaid Other

## 2021-05-20 ENCOUNTER — Encounter (HOSPITAL_BASED_OUTPATIENT_CLINIC_OR_DEPARTMENT_OTHER): Payer: Self-pay

## 2021-05-20 ENCOUNTER — Emergency Department (HOSPITAL_BASED_OUTPATIENT_CLINIC_OR_DEPARTMENT_OTHER)
Admission: EM | Admit: 2021-05-20 | Discharge: 2021-05-20 | Disposition: A | Discharge status: Home / Self Care | Payer: Medicaid Other | Attending: Emergency Medicine | Admitting: Emergency Medicine

## 2021-05-20 ENCOUNTER — Other Ambulatory Visit: Payer: Self-pay

## 2021-05-20 DIAGNOSIS — I10 Essential (primary) hypertension: Secondary | ICD-10-CM | POA: Insufficient documentation

## 2021-05-20 DIAGNOSIS — M549 Dorsalgia, unspecified: Secondary | ICD-10-CM | POA: Diagnosis not present

## 2021-05-20 DIAGNOSIS — R600 Localized edema: Secondary | ICD-10-CM | POA: Insufficient documentation

## 2021-05-20 DIAGNOSIS — R609 Edema, unspecified: Secondary | ICD-10-CM

## 2021-05-20 DIAGNOSIS — G8929 Other chronic pain: Secondary | ICD-10-CM | POA: Diagnosis not present

## 2021-05-20 DIAGNOSIS — M7989 Other specified soft tissue disorders: Secondary | ICD-10-CM | POA: Diagnosis present

## 2021-05-20 LAB — URINALYSIS, ROUTINE W REFLEX MICROSCOPIC
Bilirubin Urine: NEGATIVE
Glucose, UA: NEGATIVE mg/dL
Hgb urine dipstick: NEGATIVE
Ketones, ur: NEGATIVE mg/dL
Leukocytes,Ua: NEGATIVE
Nitrite: NEGATIVE
Protein, ur: NEGATIVE mg/dL
Specific Gravity, Urine: 1.02 (ref 1.005–1.030)
pH: 8.5 — ABNORMAL HIGH (ref 5.0–8.0)

## 2021-05-20 LAB — BASIC METABOLIC PANEL
Anion gap: 4 — ABNORMAL LOW (ref 5–15)
BUN: 11 mg/dL (ref 6–20)
CO2: 23 mmol/L (ref 22–32)
Calcium: 8.7 mg/dL — ABNORMAL LOW (ref 8.9–10.3)
Chloride: 110 mmol/L (ref 98–111)
Creatinine, Ser: 1.13 mg/dL — ABNORMAL HIGH (ref 0.44–1.00)
GFR, Estimated: >60 mL/min (ref >60)
Glucose, Bld: 137 mg/dL — ABNORMAL HIGH (ref 70–99)
Potassium: 3.6 mmol/L (ref 3.5–5.1)
Sodium: 137 mmol/L (ref 135–145)

## 2021-05-20 LAB — CBC WITH DIFFERENTIAL/PLATELET
Abs Immature Granulocytes: 0.02 10*3/uL (ref 0.00–0.07)
Basophils Absolute: 0 10*3/uL (ref 0.0–0.1)
Basophils Relative: 1 %
Eosinophils Absolute: 0.1 10*3/uL (ref 0.0–0.5)
Eosinophils Relative: 3 %
HCT: 38.4 % (ref 36.0–46.0)
Hemoglobin: 12.9 g/dL (ref 12.0–15.0)
Immature Granulocytes: 0 %
Lymphocytes Relative: 47 %
Lymphs Abs: 2.4 10*3/uL (ref 0.7–4.0)
MCH: 30.8 pg (ref 26.0–34.0)
MCHC: 33.6 g/dL (ref 30.0–36.0)
MCV: 91.6 fL (ref 80.0–100.0)
Monocytes Absolute: 0.5 10*3/uL (ref 0.1–1.0)
Monocytes Relative: 11 %
Neutro Abs: 1.9 10*3/uL (ref 1.7–7.7)
Neutrophils Relative %: 38 %
Platelets: 224 10*3/uL (ref 150–400)
RBC: 4.19 MIL/uL (ref 3.87–5.11)
RDW: 14.9 % (ref 11.5–15.5)
WBC: 5 10*3/uL (ref 4.0–10.5)
nRBC: 0 % (ref 0.0–0.2)

## 2021-05-20 LAB — HCG, SERUM, QUALITATIVE: Preg, Serum: NEGATIVE

## 2021-05-20 MED ORDER — FUROSEMIDE 20 MG PO TABS: 20.0000 mg | ORAL_TABLET | Freq: Every day | ORAL | 0 refills | Status: DC

## 2021-05-20 MED ORDER — FUROSEMIDE 10 MG/ML IJ SOLN
20.0000 mg | Freq: Once | INTRAMUSCULAR | Status: AC
  Administered 2021-05-20: 20 mg via INTRAVENOUS
  Filled 2021-05-20: qty 2

## 2021-05-20 MED ORDER — HYDROCODONE-ACETAMINOPHEN 5-325 MG PO TABS
2.0000 | ORAL_TABLET | Freq: Once | ORAL | Status: AC
  Administered 2021-05-20: 2 via ORAL
  Filled 2021-05-20: qty 2

## 2021-05-20 MED ORDER — POTASSIUM CHLORIDE CRYS ER 20 MEQ PO TBCR: 20.0000 meq | EXTENDED_RELEASE_TABLET | Freq: Every day | ORAL | 0 refills | Status: DC

## 2021-05-20 MED ORDER — POTASSIUM CHLORIDE CRYS ER 20 MEQ PO TBCR
20.0000 meq | EXTENDED_RELEASE_TABLET | Freq: Once | ORAL | Status: AC
  Administered 2021-05-20: 20 meq via ORAL
  Filled 2021-05-20: qty 1

## 2021-05-20 NOTE — Discharge Instructions (Addendum)
You were seen in the emergency department for bilateral leg swelling and pain.  You had an ultrasound that did not show any evidence of a blood clot.  Your blood work did not show an obvious explanation for your symptoms.  Your blood pressure was also elevated here.  You were given a dose of a medication to help you urinate.  We are sending prescriptions to the pharmacy to do this for 5 more days.  You also need to take potassium.  Please follow-up with your primary care doctor for further evaluation of this.  Return if any worsening or concerning symptoms ?

## 2021-05-20 NOTE — ED Provider Notes (Signed)
?[facility] EMERGENCY DEPARTMENT ?Provider Note ? ? ?CSN: [id number] ?Arrival date & time: 05/20/21  1803 ? ?  ? ?History ? ?Chief Complaint  ?Patient presents with  ? Leg Swelling  ? ? ?Angela Doyle is a 36 y.o. female.  She is here with complaint of 2 weeks of increased pain and swelling in her legs.  No trauma.  Has tried elevation without improvement.  Today when she woke up she felt dizzy and could hear her heart beating in her ears.  Figured her blood pressure was high.  Does not usually have hypertension.  She said she has been using a low-salt diet.  No recent medication changes.  No chest pain or shortness of breath.  Has chronic back pain and sees pain management. ? ?The history is provided by the patient and a parent.  ?Leg Pain ?Location:  Leg ?Time since incident:  2 weeks ?Injury: no   ?Leg location:  R leg and L leg ?Pain details:  ?  Quality:  Aching ?  Severity:  Severe ?  Onset quality:  Gradual ?  Duration:  2 weeks ?  Timing:  Constant ?  Progression:  Unchanged ?Chronicity:  Recurrent ?Relieved by:  Nothing ?Worsened by:  Bearing weight ?Ineffective treatments:  Elevation ?Associated symptoms: back pain, stiffness and swelling   ?Associated symptoms: no fever   ? ?  ? ?Home Medications ?Prior to Admission medications   ?Medication Sig Start Date End Date Taking? Authorizing Provider  ?allopurinol (ZYLOPRIM) 100 MG tablet TK 1 T PO D 10/15/18   [provider]  ?ibuprofen (ADVIL) 800 MG tablet Take 1 tablet (800 mg total) by mouth every 8 (eight) hours as needed. 07/31/20   Brock Bad, MD  ?loratadine (CLARITIN) 10 MG tablet Take 1 tablet (10 mg total) by mouth daily. 07/31/20   Brock Bad, MD  ?methocarbamol (ROBAXIN) 750 MG tablet TK 1 T PO Q 12 H PRN 09/30/18   [provider]  ?nortriptyline (PAMELOR) 50 MG capsule Take 100 mg by mouth at bedtime. 12/10/20   [provider]  ?oxyCODONE-acetaminophen (PERCOCET) 10-325 MG tablet TK 1 T PO TID 10/17/18    [provider]  ?pregabalin (LYRICA) 200 MG capsule Take 1 capsule (200 mg total) by mouth 2 (two) times daily for 30 days. ?Patient taking differently: Take 200 mg by mouth 2 (two) times daily. Taking 150mg  BID 03/12/18 12/25/20  12/27/20, FNP  ?Prenat w/o A-FeCbn-Meth-FA-DHA (PRENATE MINI) 29-0.6-0.4-350 MG CAPS Take 1 capsule by mouth daily before breakfast. 12/17/18   12/19/18, MD  ?rizatriptan (MAXALT-MLT) 10 MG disintegrating tablet Take 1 tablet (10 mg total) by mouth as needed for migraine. May repeat in 2 hours if needed 12/25/20   12/27/20, MD  ?tiZANidine (ZANAFLEX) 4 MG tablet TK 1 T PO TID PRN 09/29/18   [provider]  ?topiramate (TOPAMAX) 50 MG tablet Take 1 tablet (50 mg total) by mouth 2 (two) times daily. 12/25/20   12/27/20, MD  ?   ? ?Allergies    ?Pineapple   ? ?Review of Systems   ?Review of Systems  ?Constitutional:  Negative for fever.  ?HENT:  Negative for sore throat.   ?Eyes:  Negative for visual disturbance.  ?Respiratory:  Negative for shortness of breath.   ?Cardiovascular:  Positive for leg swelling. Negative for chest pain.  ?Gastrointestinal:  Negative for abdominal pain.  ?Genitourinary:  Negative for dysuria.  ?Musculoskeletal:  Positive for  back pain and stiffness.  ?Skin:  Negative for rash.  ?Neurological:  Positive for dizziness and headaches.  ? ?Physical Exam ?Updated Vital Signs ?BP (!) 173/100 (BP Location: Right Arm)   Pulse (!) 104   Temp 98.6 ?F (37 ?C) (Oral)   Resp 16   Ht 5\' 2"  (1.575 m)   Wt 113.4 kg   LMP 04/27/2021   SpO2 100%   BMI 45.73 kg/m?  ?Physical Exam ?Vitals and nursing note reviewed.  ?Constitutional:   ?   General: She is not in acute distress. ?   Appearance: Normal appearance. She is well-developed. She is obese.  ?HENT:  ?   Head: Normocephalic and atraumatic.  ?Eyes:  ?   Conjunctiva/sclera: Conjunctivae normal.  ?Cardiovascular:  ?   Rate and Rhythm: Normal rate and regular rhythm.  ?   Heart  sounds: No murmur heard. ?Pulmonary:  ?   Effort: Pulmonary effort is normal. No respiratory distress.  ?   Breath sounds: Normal breath sounds.  ?Abdominal:  ?   Palpations: Abdomen is soft.  ?   Tenderness: There is no abdominal tenderness. There is no guarding or rebound.  ?Musculoskeletal:     ?   General: Swelling and tenderness present.  ?   Cervical back: Neck supple.  ?   Right lower leg: Edema present.  ?   Left lower leg: Edema present.  ?   Comments: She has diffuse tenderness of both of her legs.  There is no color change or temperature change.  Distal pulses intact.  Sensorimotor intact.  No cords appreciated.  Cap refill brisk.  ?Skin: ?   General: Skin is warm and dry.  ?   Capillary Refill: Capillary refill takes less than 2 seconds.  ?Neurological:  ?   General: No focal deficit present.  ?   Mental Status: She is alert.  ?   Sensory: No sensory deficit.  ?   Motor: No weakness.  ? ? ?ED Results / Procedures / Treatments   ?Labs ?(all labs ordered are listed, but only abnormal results are displayed) ?Labs Reviewed  ?BASIC METABOLIC PANEL - Abnormal; Notable for the following components:  ?    Result Value  ? Glucose, Bld 137 (*)   ? Creatinine, Ser 1.13 (*)   ? Calcium 8.7 (*)   ? Anion gap 4 (*)   ? All other components within normal limits  ?URINALYSIS, ROUTINE W REFLEX MICROSCOPIC - Abnormal; Notable for the following components:  ? APPearance CLOUDY (*)   ? pH 8.5 (*)   ? All other components within normal limits  ?CBC WITH DIFFERENTIAL/PLATELET  ?HCG, SERUM, QUALITATIVE  ? ? ?EKG ?EKG Interpretation ? ?Date/Time:  Thursday May 20 2021 18:50:41 EDT ?Ventricular Rate:  95 ?PR Interval:  159 ?QRS Duration: 89 ?QT Interval:  355 ?QTC Calculation: 447 ?R Axis:   -11 ?Text Interpretation: Sinus rhythm Low voltage, precordial leads Consider anterior infarct No significant change since prior 6/17 Confirmed by Meridee ScoreButler, Cornelious Bartolucci [phone]([id number]) on 05/20/2021 6:53:18 PM ? ?Radiology ?US Venous Img Lower Bilateral  (DVT) ? ?Result Date: 05/20/2021 ?CLINICAL DATA:  Lower extremity swelling EXAM: BILATERAL LOWER EXTREMITY VENOUS DOPPLER ULTRASOUND TECHNIQUE: Gray-scale sonography with compression, as well as color and duplex ultrasound, were performed to evaluate the deep venous system(s) from the level of the common femoral vein through the popliteal and proximal calf veins. COMPARISON:  None. FINDINGS: VENOUS Normal compressibility of the common femoral, superficial femoral, and popliteal veins, as well as the  visualized calf veins. Visualized portions of profunda femoral vein and great saphenous vein unremarkable. No filling defects to suggest DVT on grayscale or color Doppler imaging. Doppler waveforms show normal direction of venous flow, normal respiratory plasticity and response to augmentation. OTHER None. Limitations: none IMPRESSION: 1. No evidence of deep venous thrombosis within either lower extremity. Electronically Signed   By: Sharlet Salina M.D.   On: 05/20/2021 20:47  ? ?DG Chest Port 1 View ? ?Result Date: 05/20/2021 ?CLINICAL DATA:  Bilateral lower extremity swelling for 2 weeks, dizziness EXAM: PORTABLE CHEST 1 VIEW COMPARISON:  07/31/2015 FINDINGS: Single frontal view of the chest demonstrates an enlarged cardiac silhouette. There is mild central vascular congestion without airspace disease, effusion, or pneumothorax. No acute bony abnormalities. IMPRESSION: 1. Mild central vascular congestion without overt edema. 2. Mild cardiomegaly. Electronically Signed   By: Sharlet Salina M.D.   On: 05/20/2021 20:13   ? ?Procedures ?Procedures  ? ? ?Medications Ordered in ED ?Medications  ?furosemide (LASIX) injection 20 mg (20 mg Intravenous Given 05/20/21 2111)  ?potassium chloride SA (KLOR-CON M) CR tablet 20 mEq (20 mEq Oral Given 05/20/21 2110)  ?HYDROcodone-acetaminophen (NORCO/VICODIN) 5-325 MG per tablet 2 tablet (2 tablets Oral Given 05/20/21 2130)  ? ? ?ED Course/ Medical Decision Making/ A&P ?  ?                         ?Medical Decision Making ?Amount and/or Complexity of Data Reviewed ?Labs: ordered. ?Radiology: ordered. ? ?Risk ?Prescription drug management. ? ?This patient complains of bilateral lower extremity

## 2021-05-20 NOTE — ED Notes (Signed)
Patient discharged to home.  All discharge instructions reviewed.  Patient verbalized understanding via teachback method.  VS WDL.  Respirations even and unlabored.  Ambulatory out of ED.   °

## 2021-05-20 NOTE — ED Triage Notes (Signed)
Pt c/o bilat LE swelling x 1-2 weeks-NAD-to triage in w/c ?

## 2021-06-17 ENCOUNTER — Emergency Department (HOSPITAL_BASED_OUTPATIENT_CLINIC_OR_DEPARTMENT_OTHER): Payer: Medicaid Other

## 2021-06-17 ENCOUNTER — Encounter (HOSPITAL_BASED_OUTPATIENT_CLINIC_OR_DEPARTMENT_OTHER): Payer: Self-pay

## 2021-06-17 ENCOUNTER — Other Ambulatory Visit: Payer: Self-pay

## 2021-06-17 ENCOUNTER — Emergency Department (HOSPITAL_BASED_OUTPATIENT_CLINIC_OR_DEPARTMENT_OTHER)
Admission: EM | Admit: 2021-06-17 | Discharge: 2021-06-17 | Disposition: A | Discharge status: Home / Self Care | Payer: Medicaid Other | Attending: Emergency Medicine | Admitting: Emergency Medicine

## 2021-06-17 DIAGNOSIS — R0602 Shortness of breath: Secondary | ICD-10-CM | POA: Insufficient documentation

## 2021-06-17 DIAGNOSIS — R079 Chest pain, unspecified: Secondary | ICD-10-CM | POA: Insufficient documentation

## 2021-06-17 DIAGNOSIS — M546 Pain in thoracic spine: Secondary | ICD-10-CM | POA: Diagnosis not present

## 2021-06-17 DIAGNOSIS — R519 Headache, unspecified: Secondary | ICD-10-CM | POA: Diagnosis not present

## 2021-06-17 DIAGNOSIS — R7989 Other specified abnormal findings of blood chemistry: Secondary | ICD-10-CM | POA: Diagnosis not present

## 2021-06-17 LAB — CBC
HCT: 41.9 % (ref 36.0–46.0)
Hemoglobin: 14.2 g/dL (ref 12.0–15.0)
MCH: 30.7 pg (ref 26.0–34.0)
MCHC: 33.9 g/dL (ref 30.0–36.0)
MCV: 90.7 fL (ref 80.0–100.0)
Platelets: 238 10*3/uL (ref 150–400)
RBC: 4.62 MIL/uL (ref 3.87–5.11)
RDW: 13.6 % (ref 11.5–15.5)
WBC: 6.7 10*3/uL (ref 4.0–10.5)
nRBC: 0 % (ref 0.0–0.2)

## 2021-06-17 LAB — BASIC METABOLIC PANEL
Anion gap: 9 (ref 5–15)
BUN: 14 mg/dL (ref 6–20)
CO2: 24 mmol/L (ref 22–32)
Calcium: 9.6 mg/dL (ref 8.9–10.3)
Chloride: 109 mmol/L (ref 98–111)
Creatinine, Ser: 1.09 mg/dL — ABNORMAL HIGH (ref 0.44–1.00)
GFR, Estimated: >60 mL/min (ref >60)
Glucose, Bld: 131 mg/dL — ABNORMAL HIGH (ref 70–99)
Potassium: 3.3 mmol/L — ABNORMAL LOW (ref 3.5–5.1)
Sodium: 142 mmol/L (ref 135–145)

## 2021-06-17 LAB — PREGNANCY, URINE: Preg Test, Ur: NEGATIVE

## 2021-06-17 LAB — TSH: TSH: 1.064 u[IU]/mL (ref 0.350–4.500)

## 2021-06-17 LAB — BRAIN NATRIURETIC PEPTIDE: B Natriuretic Peptide: 21.3 pg/mL (ref 0.0–100.0)

## 2021-06-17 LAB — TROPONIN I (HIGH SENSITIVITY)
Troponin I (High Sensitivity): 2 ng/L (ref <18)
Troponin I (High Sensitivity): <2 ng/L (ref <18)

## 2021-06-17 LAB — D-DIMER, QUANTITATIVE: D-Dimer, Quant: 0.31 ug/mL-FEU (ref 0.00–0.50)

## 2021-06-17 MED ORDER — IOHEXOL 350 MG/ML SOLN
100.0000 mL | Freq: Once | INTRAVENOUS | Status: AC | PRN
  Administered 2021-06-17: 100 mL via INTRAVENOUS

## 2021-06-17 MED ORDER — FUROSEMIDE 20 MG PO TABS
20.0000 mg | ORAL_TABLET | Freq: Every day | ORAL | 0 refills | Status: DC
Start: 2021-06-17 — End: 2022-06-29

## 2021-06-17 MED ORDER — DIPHENHYDRAMINE HCL 50 MG/ML IJ SOLN
25.0000 mg | Freq: Once | INTRAMUSCULAR | Status: AC
  Administered 2021-06-17: 25 mg via INTRAVENOUS
  Filled 2021-06-17: qty 1

## 2021-06-17 MED ORDER — FUROSEMIDE 20 MG PO TABS
20.0000 mg | ORAL_TABLET | Freq: Once | ORAL | Status: AC
Start: 2021-06-17 — End: 2021-06-17
  Administered 2021-06-17: 20 mg via ORAL
  Filled 2021-06-17: qty 1

## 2021-06-17 MED ORDER — POTASSIUM CHLORIDE CRYS ER 20 MEQ PO TBCR
40.0000 meq | EXTENDED_RELEASE_TABLET | Freq: Once | ORAL | Status: AC
Start: 2021-06-17 — End: 2021-06-17
  Administered 2021-06-17: 40 meq via ORAL
  Filled 2021-06-17: qty 2

## 2021-06-17 MED ORDER — LORAZEPAM 2 MG/ML IJ SOLN
1.0000 mg | Freq: Once | INTRAMUSCULAR | Status: AC
Start: 2021-06-17 — End: 2021-06-17
  Administered 2021-06-17: 1 mg via INTRAVENOUS
  Filled 2021-06-17: qty 1

## 2021-06-17 MED ORDER — HYDROCODONE-ACETAMINOPHEN 5-325 MG PO TABS
2.0000 | ORAL_TABLET | Freq: Four times a day (QID) | ORAL | Status: DC | PRN
  Administered 2021-06-17: 2 via ORAL
  Filled 2021-06-17: qty 2

## 2021-06-17 MED ORDER — POTASSIUM CHLORIDE CRYS ER 20 MEQ PO TBCR: 20.0000 meq | EXTENDED_RELEASE_TABLET | Freq: Two times a day (BID) | ORAL | 0 refills | Status: DC

## 2021-06-17 MED ORDER — METOCLOPRAMIDE HCL 5 MG/ML IJ SOLN
10.0000 mg | Freq: Once | INTRAMUSCULAR | Status: AC
  Administered 2021-06-17: 10 mg via INTRAVENOUS
  Filled 2021-06-17: qty 2

## 2021-06-17 NOTE — ED Triage Notes (Signed)
Pt c/o mid/lower back pain since Tuesday, shortness of breath since yesterday, chest heaviness.  ? ?Dyspnea on exertion, HR increased to 145 upon ambulation to room, SpO2 remained at 100% ?

## 2021-06-17 NOTE — ED Notes (Signed)
RT called to assess patient. HR in the 160s and more SOB when when she arrived. BBS clear and equal. Encourage patient to take some slow deep breaths, RN and MD aware.  ?

## 2021-06-17 NOTE — ED Notes (Signed)
CT scanner went down during scan, will return when machine is fixed. Pt noted to have improved symptoms after return from CT and ativan administration.  ?

## 2021-06-17 NOTE — ED Notes (Signed)
Patient now complains of increased pain. Ivin Booty PA made aware. Repeat EKG preformed at this time and given to PA.  ?

## 2021-06-17 NOTE — ED Notes (Signed)
Patient HR noted to be 145-160, this RN entered room, pt noted to be have increased work of breathing. Repeat EKG obtained. Dr. Rush Landmark and [place], [place] notified. CT chest ordered and ativan ordered. Pt states unable to move arms, instructed to slow deep breathe. ?

## 2021-06-17 NOTE — ED Provider Notes (Signed)
?[facility] EMERGENCY DEPARTMENT ?Provider Note ? ? ?CSN: [id number] ?Arrival date & time: 06/17/21  1027 ? ?  ? ?History ? ?Chief Complaint  ?Patient presents with  ? Shortness of Breath  ? ? ?Angela Doyle is a 36 y.o. female. ? ?Patient presents emergency department today for evaluation of shortness of breath and middle back pain starting about 2 days ago.  She states that shortness of breath is worse with exertion.  No chest pain but describes a heavy sensation at times.  No cough or fever.  No wheezing or history of asthma.  She reports leg swelling that has been going on for several weeks.  She was seen in the emergency department 1 month ago and had negative DVT studies.  She reports that she gets short of breath even walking down the hall to the bathroom. Patient denies risk factors for pulmonary embolism including: unilateral leg swelling, history of DVT/PE/other blood clots, use of exogenous hormones, recent immobilizations, recent surgery, recent travel (>4hr segment), malignancy, hemoptysis.  During previous ED visit, she had mildly elevated creatinine and mild cardiomegaly on chest x-ray with vascular congestion.  No reported history of heart failure. ? ? ?  ? ?Home Medications ?Prior to Admission medications   ?Medication Sig Start Date End Date Taking? Authorizing Provider  ?busPIRone (BUSPAR) 10 MG tablet Take 10 mg by mouth 3 (three) times daily. 04/23/21   [provider]  ?furosemide (LASIX) 20 MG tablet Take 1 tablet (20 mg total) by mouth daily. 05/20/21   Terrilee Files, MD  ?HYDROcodone-acetaminophen [facility]) 10-325 MG tablet Take 1 tablet by mouth every 6 (six) hours as needed. 04/15/21   [provider]  ?hydrOXYzine (ATARAX) 50 MG tablet Take 50 mg by mouth 3 (three) times daily as needed. 05/05/21   [provider]  ?loratadine (CLARITIN) 10 MG tablet Take 1 tablet (10 mg total) by mouth daily. 07/31/20   Brock Bad, MD  ?meloxicam (MOBIC) 15 MG  tablet Take 15 mg by mouth daily. 05/18/21   [provider]  ?nortriptyline (PAMELOR) 50 MG capsule Take 100 mg by mouth at bedtime. 12/10/20   [provider]  ?potassium chloride SA (KLOR-CON M) 20 MEQ tablet Take 1 tablet (20 mEq total) by mouth daily. 05/20/21   Terrilee Files, MD  ?prazosin (MINIPRESS) 1 MG capsule Take 1 mg by mouth at bedtime. 04/23/21   [provider]  ?pregabalin (LYRICA) 150 MG capsule Take 150 mg by mouth 2 (two) times daily. 04/19/21   [provider]  ?Prenat w/o A-FeCbn-Meth-FA-DHA (PRENATE MINI) 29-0.6-0.4-350 MG CAPS Take 1 capsule by mouth daily before breakfast. 12/17/18   Brock Bad, MD  ?rizatriptan (MAXALT-MLT) 10 MG disintegrating tablet Take 1 tablet (10 mg total) by mouth as needed for migraine. May repeat in 2 hours if needed 12/25/20   Van Clines, MD  ?tiZANidine (ZANAFLEX) 4 MG tablet TK 1 T PO TID PRN 09/29/18   [provider]  ?topiramate (TOPAMAX) 50 MG tablet Take 1 tablet (50 mg total) by mouth 2 (two) times daily. 12/25/20   Van Clines, MD  ?traZODone (DESYREL) 100 MG tablet Take 100-200 mg by mouth at bedtime. 04/23/21   [provider]  ?   ? ?Allergies    ?Pineapple   ? ?Review of Systems   ?Review of Systems ? ?Physical Exam ?Updated Vital Signs ?BP 140/80 (BP Location: Right Arm)   Pulse (!) 107   Temp 98.2 ?F (  36.8 ?C) (Oral)   Resp (!) 21   Ht 5\' 2"  (1.575 m)   Wt 126 kg   LMP 05/20/2021 (Approximate)   SpO2 100%   BMI 50.81 kg/m?  ? ?Physical Exam ?Vitals and nursing note reviewed.  ?Constitutional:   ?   Appearance: She is well-developed. She is not diaphoretic.  ?HENT:  ?   Head: Normocephalic and atraumatic.  ?   Mouth/Throat:  ?   Mouth: Mucous membranes are not dry.  ?Eyes:  ?   Conjunctiva/sclera: Conjunctivae normal.  ?Neck:  ?   Vascular: Normal carotid pulses. No JVD.  ?   Trachea: Trachea normal. No tracheal deviation.  ?Cardiovascular:  ?   Rate and Rhythm: Normal rate  and regular rhythm.  ?   Pulses: No decreased pulses.     ?     Radial pulses are 2+ on the right side and 2+ on the left side.  ?   Heart sounds: Normal heart sounds, S1 normal and S2 normal. No murmur heard. ?Pulmonary:  ?   Effort: Pulmonary effort is normal. No respiratory distress.  ?   Breath sounds: No wheezing.  ?Chest:  ?   Chest wall: No tenderness.  ?Abdominal:  ?   General: Bowel sounds are normal.  ?   Palpations: Abdomen is soft.  ?   Tenderness: There is no abdominal tenderness. There is no guarding or rebound.  ?Musculoskeletal:     ?   General: Normal range of motion.  ?   Cervical back: Normal range of motion and neck supple. No muscular tenderness.  ?   Right lower leg: No tenderness. No edema.  ?   Left lower leg: No tenderness. No edema.  ?Skin: ?   General: Skin is warm and dry.  ?   Coloration: Skin is not pale.  ?Neurological:  ?   Mental Status: She is alert.  ? ? ?ED Results / Procedures / Treatments   ?Labs ?(all labs ordered are listed, but only abnormal results are displayed) ?Labs Reviewed  ?BASIC METABOLIC PANEL - Abnormal; Notable for the following components:  ?    Result Value  ? Potassium 3.3 (*)   ? Glucose, Bld 131 (*)   ? Creatinine, Ser 1.09 (*)   ? All other components within normal limits  ?CBC  ?PREGNANCY, URINE  ?BRAIN NATRIURETIC PEPTIDE  ?D-DIMER, QUANTITATIVE  ?TSH  ?TROPONIN I (HIGH SENSITIVITY)  ?TROPONIN I (HIGH SENSITIVITY)  ? ? ?ED ECG REPORT ? ? Date: 06/17/2021 ? Rate: 96 ? Rhythm: normal sinus rhythm ? QRS Axis: right ? Intervals: normal ? ST/T Wave abnormalities: nonspecific T wave changes ? Conduction Disutrbances:none ? Narrative Interpretation:  ? Old EKG Reviewed: unchanged ? ?I have personally reviewed the EKG tracing and agree with the computerized printout as noted. ? ? ?Radiology ?DG Chest 2 View ? ?Result Date: 06/17/2021 ?CLINICAL DATA:  Shortness of breath. EXAM: CHEST - 2 VIEW COMPARISON:  May 20, 2021 radiograph FINDINGS: Mild cardiac enlargement  with central vascular prominence. Bibasilar predominant interstitial opacities. No pleural effusion. No pneumothorax. No acute osseous abnormality. IMPRESSION: Findings suggestive of CHF with mild pulmonary edema. Electronically Signed   By: Maudry MayhewJeffrey  Waltz M.D.   On: 06/17/2021 11:20   ? ?Procedures ?Procedures  ? ? ?Medications Ordered in ED ?Medications  ?HYDROcodone-acetaminophen (NORCO/VICODIN) 5-325 MG per tablet 2 tablet (2 tablets Oral Given 06/17/21 1245)  ?furosemide (LASIX) tablet 20 mg (has no administration in time range)  ?potassium chloride SA (KLOR-CON  M) CR tablet 40 mEq (has no administration in time range)  ?metoCLOPramide (REGLAN) injection 10 mg (10 mg Intravenous Given 06/17/21 1412)  ?diphenhydrAMINE (BENADRYL) injection 25 mg (25 mg Intravenous Given 06/17/21 1410)  ?LORazepam (ATIVAN) injection 1 mg (1 mg Intravenous Given 06/17/21 1445)  ?iohexol (OMNIPAQUE) 350 MG/ML injection 100 mL (100 mLs Intravenous Contrast Given 06/17/21 1449)  ? ? ?ED Course/ Medical Decision Making/ A&P ?  ? ?Patient seen and examined. History obtained directly from patient.  ? ?Labs/EKG: Ordered CBC, BMP, BNP, troponin, D-dimer.  EKG personally viewed and interpreted. ? ?Imaging: Ordered chest x-ray. ? ?Medications/Fluids: None ordered. ? ?Most recent vital signs reviewed and are as follows: ?BP 140/80 (BP Location: Right Arm)   Pulse (!) 107   Temp 98.2 ?F (36.8 ?C) (Oral)   Resp (!) 21   Ht 5\' 2"  (1.575 m)   Wt 126 kg   LMP 05/20/2021 (Approximate)   SpO2 100%   BMI 50.81 kg/m?  ? ?Initial impression: Shortness of breath and back pain ? ?Patient's chronic home pain medication ordered for back pain. ? ?1:51 PM Reassessment performed. Patient appears stable.  Patient discussed with Dr. 05/22/2021.  Still complaining of some chest pain and headache. ? ?Labs personally reviewed and interpreted including: Pregnancy negative; BMP potassium 3.3, creatinine stable at 1.09; CBC unremarkable; troponin 2, BNP 21.3, D-dimer  0.31 all reassuring.  Due to tachycardia at this visit and last, TSH ordered and is pending.  May need follow-up at next PCP appointment on Monday. ? ?Imaging personally visualized and interpreted including: Chest x-ray, ag

## 2021-06-17 NOTE — Discharge Instructions (Signed)
Please read and follow all provided instructions. ? ?Your diagnoses today include:  ?1. Shortness of breath   ? ? ?Tests performed today include: ?An EKG of your heart: no signs of stress on the heart or heart attack ?A chest x-ray: possible signs of heart failure ?Cardiac enzymes: a blood test for heart muscle damage, no sign of heart attack or stress on the heart ?Blood counts and electrolytes: slightly low potassium ?Screening test for blood clot: negative ?Screening test for heart failure: blood test for heart failure is negative ?Vital signs. See below for your results today.  ? ?Medications prescribed:  ?Lasix: Fluid pill ? ?Potassium supplement -to treat and help prevent low potassium ? ?Take any prescribed medications only as directed. ? ?Follow-up instructions: ?Please call your cardiologist and your primary care provider tomorrow to schedule a follow-up appointment.  ? ?Return instructions:  ?SEEK IMMEDIATE MEDICAL ATTENTION IF: ?You have severe chest pain, especially if the pain is crushing or pressure-like and spreads to the arms, back, neck, or jaw, or if you have sweating, nausea or vomiting, or trouble with breathing. THIS IS AN EMERGENCY. Do not wait to see if the pain will go away. Get medical help at once. Call 911. DO NOT drive yourself to the hospital.  ?Your chest pain gets worse and does not go away after a few minutes of rest.  ?You have an attack of chest pain lasting longer than what you usually experience.  ?You have significant dizziness, if you pass out, or have trouble walking.  ?You have chest pain not typical of your usual pain for which you originally saw your caregiver.  ?You have any other emergent concerns regarding your health. ? ?Additional Information: ?Chest pain comes from many different causes. Your caregiver has diagnosed you as having chest pain that is not specific for one problem, but does not require admission.  You are at low risk for an acute heart condition or other  serious illness.  ? ?Your vital signs today were: ?BP (!) 148/91   Pulse 92   Temp 98.2 ?F (36.8 ?C) (Oral)   Resp 16   Ht 5\' 2"  (1.575 m)   Wt 126 kg   LMP 05/20/2021 (Approximate) Comment: neg upreg in er today.  SpO2 99%   BMI 50.81 kg/m?  ?If your blood pressure (BP) was elevated above 135/85 this visit, please have this repeated by your doctor within one month. ?-------------- ? ? ?

## 2021-06-28 ENCOUNTER — Ambulatory Visit: Payer: Medicaid Other | Admitting: Neurology

## 2021-06-28 ENCOUNTER — Encounter: Payer: Self-pay | Admitting: Neurology

## 2021-06-28 VITALS — BP 127/82 | HR 104 | Ht 62.0 in | Wt 263.0 lb

## 2021-06-28 DIAGNOSIS — G43009 Migraine without aura, not intractable, without status migrainosus: Secondary | ICD-10-CM

## 2021-06-28 MED ORDER — TOPIRAMATE 50 MG PO TABS: 50.0000 mg | ORAL_TABLET | Freq: Two times a day (BID) | ORAL | 12 refills | Status: DC

## 2021-06-28 MED ORDER — RIZATRIPTAN BENZOATE 10 MG PO TBDP: 10.0000 mg | ORAL_TABLET | ORAL | 11 refills | Status: DC | PRN

## 2021-06-28 NOTE — Patient Instructions (Signed)
Good to see you. ? ?Continue Topamax 50mg  twice a day and as needed Maxalt ? ?2. Proceed with Ortho evaluation, continue follow-up with Pain Management ? ?3. Follow-up in 1 year, call for any changes ?
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
## 2022-05-13 ENCOUNTER — Ambulatory Visit (INDEPENDENT_AMBULATORY_CARE_PROVIDER_SITE_OTHER): Payer: Medicaid Other | Admitting: Obstetrics

## 2022-05-13 ENCOUNTER — Encounter: Payer: Self-pay | Admitting: Obstetrics

## 2022-05-13 ENCOUNTER — Other Ambulatory Visit (HOSPITAL_COMMUNITY)
Admission: RE | Admit: 2022-05-13 | Discharge: 2022-05-13 | Disposition: A | Discharge status: Home / Self Care | Payer: Medicaid Other | Source: Ambulatory Visit | Attending: Obstetrics | Admitting: Obstetrics

## 2022-05-13 VITALS — BP 131/89 | HR 96 | Ht 62.0 in | Wt 237.6 lb

## 2022-05-13 DIAGNOSIS — N898 Other specified noninflammatory disorders of vagina: Secondary | ICD-10-CM | POA: Insufficient documentation

## 2022-05-13 DIAGNOSIS — Z1339 Encounter for screening examination for other mental health and behavioral disorders: Secondary | ICD-10-CM

## 2022-05-13 DIAGNOSIS — Z3009 Encounter for other general counseling and advice on contraception: Secondary | ICD-10-CM

## 2022-05-13 DIAGNOSIS — Z01419 Encounter for gynecological examination (general) (routine) without abnormal findings: Secondary | ICD-10-CM | POA: Diagnosis present

## 2022-05-13 DIAGNOSIS — Z113 Encounter for screening for infections with a predominantly sexual mode of transmission: Secondary | ICD-10-CM

## 2022-05-13 DIAGNOSIS — Z6841 Body Mass Index (BMI) 40.0 and over, adult: Secondary | ICD-10-CM
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
## 2022-05-13 NOTE — Progress Notes (Signed)
Pt is in the office for annual. Last pap 04/17/2020 LMP 04/12/22 Declines BC Desires STD testing

## 2022-05-14 LAB — HIV ANTIBODY (ROUTINE TESTING W REFLEX): HIV Screen 4th Generation wRfx: NONREACTIVE

## 2022-05-14 LAB — RPR: RPR Ser Ql: NONREACTIVE

## 2022-05-14 LAB — HEPATITIS B SURFACE ANTIGEN: Hepatitis B Surface Ag: NEGATIVE

## 2022-05-14 LAB — HEPATITIS C ANTIBODY: Hep C Virus Ab: NONREACTIVE

## 2022-05-16 LAB — CERVICOVAGINAL ANCILLARY ONLY
Bacterial Vaginitis (gardnerella): POSITIVE — AB
Candida Glabrata: NEGATIVE
Candida Vaginitis: NEGATIVE
Chlamydia: NEGATIVE
Comment: NEGATIVE
Comment: NEGATIVE
Comment: NEGATIVE
Comment: NEGATIVE
Comment: NEGATIVE
Comment: NORMAL
Neisseria Gonorrhea: NEGATIVE
Trichomonas: NEGATIVE

## 2022-05-18 LAB — CYTOLOGY - PAP
Comment: NEGATIVE
Comment: NEGATIVE
Comment: NEGATIVE
Diagnosis: NEGATIVE
HPV 16: NEGATIVE
HPV 18 / 45: NEGATIVE
High risk HPV: POSITIVE — AB

## 2022-06-29 ENCOUNTER — Ambulatory Visit: Payer: Medicaid Other | Admitting: Neurology

## 2022-06-29 ENCOUNTER — Encounter: Payer: Self-pay | Admitting: Neurology

## 2022-06-29 VITALS — BP 116/64 | HR 88 | Ht 62.0 in | Wt 243.6 lb

## 2022-06-29 DIAGNOSIS — G43009 Migraine without aura, not intractable, without status migrainosus: Secondary | ICD-10-CM | POA: Diagnosis not present

## 2022-06-29 DIAGNOSIS — R200 Anesthesia of skin: Secondary | ICD-10-CM

## 2022-06-29 DIAGNOSIS — R202 Paresthesia of skin: Secondary | ICD-10-CM | POA: Diagnosis not present

## 2022-06-29 MED ORDER — TOPIRAMATE 50 MG PO TABS: 50.0000 mg | ORAL_TABLET | Freq: Two times a day (BID) | ORAL | 12 refills | Status: DC

## 2022-06-29 MED ORDER — RIZATRIPTAN BENZOATE 10 MG PO TBDP: 10.0000 mg | ORAL_TABLET | ORAL | 11 refills | Status: AC | PRN
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
## 2022-06-29 NOTE — Patient Instructions (Signed)
Good to see you doing well.  Schedule EMG/NCV of the right upper extremity  2. Continue Topiramate 50mg  twice a day and as needed Maxalt, refills sent  3. Follow-up in 1 year, call for any changes

## 2022-07-24 ENCOUNTER — Other Ambulatory Visit: Payer: Self-pay | Admitting: Obstetrics

## 2022-07-24 DIAGNOSIS — J301 Allergic rhinitis due to pollen: Secondary | ICD-10-CM

## 2022-08-01 ENCOUNTER — Ambulatory Visit: Payer: Medicaid Other | Admitting: Neurology

## 2022-08-01 DIAGNOSIS — R202 Paresthesia of skin: Secondary | ICD-10-CM | POA: Diagnosis not present

## 2022-08-01 DIAGNOSIS — R200 Anesthesia of skin: Secondary | ICD-10-CM | POA: Diagnosis not present

## 2022-08-01 DIAGNOSIS — M5412 Radiculopathy, cervical region: Secondary | ICD-10-CM

## 2022-08-01 NOTE — Procedures (Signed)
  [facility]  [address] [place], [address]  [place], [place] [address] Tel: [phone] Fax: [phone] Test Date:  08/01/2022  Patient: Angela Doyle DOB: December 12, 1985 Physician: Jacquelyne Balint, MD  Sex: Female Height: 5\' 2"  Ref Phys: Patrcia Dolly, MD  ID#: [id number]   Technician:    History: This is a 37 year old female with numbness, tingling, and cramps in her right hand.  NCV & EMG Findings: Extensive electrodiagnostic evaluation of the right upper limb shows: Right median, ulnar, and radial sensory responses are within normal limits. Right median (APB) and ulnar (ADM) motor responses are within normal limits. Chronic motor axon loss changes without accompanying active denervation changes are seen in the right first dorsal interosseous, extensor indicis proprius, flexor pollicis longus, and abductor pollicis brevis muscles.  Impression: This is an abnormal study. 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 The findings are moderate in degree electrically at the right C8 level and mild at the right T1 level. No electrodiagnostic evidence of a right median mononeuropathy at or distal to the wrist (ie: carpal tunnel syndrome). Screening studies for a right ulnar or radial mononeuropathy are normal.    ___________________________ Jacquelyne Balint, MD    Nerve Conduction Studies Motor Nerve Results    Latency Amplitude F-Lat Segment Distance CV Comment  Site (ms) Norm (mV) Norm (ms)  (cm) (m/s) Norm   Right Median (APB) Motor  Wrist 1.78  < 3.9 11.5  > 6.0        Elbow 5.7 - 11.4 -  Elbow-Wrist 25 64  > 50   Right Ulnar (ADM) Motor  Wrist 1.83  < 3.1 7.1  > 7.0        Bel elbow 5.7 - 6.1 -  Bel elbow-Wrist 22 56  > 50   Ab elbow 7.7 - 5.6 -  Ab elbow-Bel elbow 10 50 -    Sensory Sites    Neg Peak Lat Amplitude (O-P) Segment Distance  Velocity Comment  Site (ms) Norm (V) Norm  (cm) (ms)   Right Median Sensory  Wrist-Dig II 2.5  < 3.4 36  > 20 Wrist-Dig II 13    Right Radial Sensory  Forearm-Wrist 1.83  < 2.7 23  > 18 Forearm-Wrist 10    Right Ulnar Sensory  Wrist-Dig V 2.3  < 3.1 21  > 12 Wrist-Dig V 11     Electromyography   Side Muscle Ins.Act Fibs Fasc Recrt Amp Dur Poly Activation Comment  Right FDI Nml Nml Nml *2- *1+ *1+ *1+ Nml N/A  Right EIP Nml Nml Nml *2- *1+ *1+ *1+ Nml N/A  Right FPL Nml Nml Nml *2- *1+ *1+ *1+ Nml N/A  Right APB Nml Nml Nml *1- *1+ *1+ *1+ Nml N/A  Right Pronator teres Nml Nml Nml Nml Nml Nml Nml Nml N/A  Right Biceps Nml Nml Nml Nml Nml Nml Nml Nml N/A  Right Triceps Nml Nml Nml Nml Nml Nml Nml Nml N/A  Right Deltoid Nml Nml Nml Nml Nml Nml Nml Nml N/A  Right C7 PSP Nml Nml Nml Nml Nml Nml Nml Nml N/A      Waveforms:  Motor      Sensory

## 2022-08-03 ENCOUNTER — Telehealth: Payer: Self-pay

## 2022-08-03 DIAGNOSIS — M5412 Radiculopathy, cervical region: Secondary | ICD-10-CM

## 2022-08-03 NOTE — Telephone Encounter (Signed)
-----   Message from Van Clines, MD sent at 08/03/2022  3:34 PM EDT ----- Pls let her know the nerve and muscle test did not show carpal tunnel syndrome, however it showed evidence of a pinched nerve in her neck. Would do MRI cervical spine without contrast (dx: radiculopathy, right hand weakness), thanks

## 2022-08-03 NOTE — Telephone Encounter (Signed)
Pt called informed nerve and muscle test did not show carpal tunnel syndrome, however it showed evidence of a pinched nerve in her neck. Would do MRI cervical spine without contrast, pt would like to do the MRI

## 2022-08-28 ENCOUNTER — Other Ambulatory Visit: Payer: Medicaid Other

## 2023-06-29 ENCOUNTER — Ambulatory Visit: Payer: Medicaid Other | Admitting: Neurology

## 2023-06-29 ENCOUNTER — Encounter: Payer: Self-pay | Admitting: Neurology

## 2023-06-29 VITALS — BP 115/73 | HR 89 | Ht 62.0 in | Wt 217.0 lb

## 2023-06-29 DIAGNOSIS — M5412 Radiculopathy, cervical region: Secondary | ICD-10-CM

## 2023-06-29 DIAGNOSIS — G43009 Migraine without aura, not intractable, without status migrainosus: Secondary | ICD-10-CM

## 2023-06-29 DIAGNOSIS — M542 Cervicalgia: Secondary | ICD-10-CM | POA: Diagnosis not present

## 2023-06-29 MED ORDER — TOPIRAMATE 50 MG PO TABS: 50.0000 mg | ORAL_TABLET | Freq: Two times a day (BID) | ORAL | 12 refills | Status: AC

## 2023-06-29 NOTE — Addendum Note (Signed)
 Addended by: Arturo Bing on: 06/29/2023 09:31 AM   Modules accepted: Orders

## 2023-06-29 NOTE — Patient Instructions (Signed)
 It's so good to see you doing well!  Schedule MRI cervical spine without contrast  2. Referral will be sent for Physical therapy for neck pain and right arm weakness  3. Continue Topiramate  50mg  twice a day. Let me know if you need refills for Maxalt   4. Follow-up in 1 year, call for any changes.
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
## 2023-07-27 ENCOUNTER — Encounter: Payer: Self-pay | Admitting: Neurology

## 2023-08-03 ENCOUNTER — Ambulatory Visit
Admission: RE | Admit: 2023-08-03 | Discharge: 2023-08-03 | Disposition: A | Discharge status: Home / Self Care | Source: Ambulatory Visit | Attending: Neurology | Admitting: Neurology

## 2023-08-03 DIAGNOSIS — M542 Cervicalgia: Secondary | ICD-10-CM

## 2023-08-03 DIAGNOSIS — M5412 Radiculopathy, cervical region: Secondary | ICD-10-CM

## 2023-08-09 ENCOUNTER — Ambulatory Visit: Payer: Self-pay | Admitting: Neurology

## 2023-08-09 DIAGNOSIS — M542 Cervicalgia: Secondary | ICD-10-CM

## 2023-08-09 DIAGNOSIS — R200 Anesthesia of skin: Secondary | ICD-10-CM

## 2023-08-09 DIAGNOSIS — M5412 Radiculopathy, cervical region: Secondary | ICD-10-CM

## 2023-08-09 DIAGNOSIS — G43009 Migraine without aura, not intractable, without status migrainosus: Secondary | ICD-10-CM

## 2023-08-09 NOTE — Telephone Encounter (Signed)
-----   Message from Ellene Gustin sent at 08/09/2023  7:50 AM EDT ----- Can we let patient know results of MRI cervical spine? There is an area of tightness around the spinal cord and nerve roots that may be the cause of her symptoms. The next steps would be PT (which I think is already ordered) then referral to spine surgery if this does not help. If she would like to speak with someone in spine, would could go ahead and order this for patient. Thank you.

## 2023-08-09 NOTE — Telephone Encounter (Signed)
 Pt called an informed that There is an area of tightness around the spinal cord and nerve roots that may be the cause of her symptoms. The next steps would be PT (which I think is already ordered) she has talked to PT and is getting started  then referral to spine surgery if this does not help. If she would like to speak with someone in spine, would could go ahead and order this for patient. Pt would like the referral to spine surgery. Will place and fax referral to [facility]

## 2023-11-03 ENCOUNTER — Ambulatory Visit: Admitting: Obstetrics

## 2023-11-06 ENCOUNTER — Ambulatory Visit: Admitting: Obstetrics

## 2023-11-20 ENCOUNTER — Other Ambulatory Visit (HOSPITAL_COMMUNITY)
Admission: RE | Admit: 2023-11-20 | Discharge: 2023-11-20 | Disposition: A | Discharge status: Home / Self Care | Source: Ambulatory Visit | Attending: Obstetrics | Admitting: Obstetrics

## 2023-11-20 ENCOUNTER — Encounter: Payer: Self-pay | Admitting: Obstetrics

## 2023-11-20 ENCOUNTER — Ambulatory Visit: Admitting: Obstetrics

## 2023-11-20 VITALS — BP 114/74 | HR 74 | Ht 62.0 in | Wt 217.1 lb

## 2023-11-20 DIAGNOSIS — E66813 Obesity, class 3: Secondary | ICD-10-CM | POA: Diagnosis not present

## 2023-11-20 DIAGNOSIS — N946 Dysmenorrhea, unspecified: Secondary | ICD-10-CM | POA: Diagnosis not present

## 2023-11-20 DIAGNOSIS — Z01419 Encounter for gynecological examination (general) (routine) without abnormal findings: Secondary | ICD-10-CM | POA: Insufficient documentation

## 2023-11-20 DIAGNOSIS — Z3009 Encounter for other general counseling and advice on contraception: Secondary | ICD-10-CM

## 2023-11-20 DIAGNOSIS — Z6841 Body Mass Index (BMI) 40.0 and over, adult: Secondary | ICD-10-CM

## 2023-11-20 MED ORDER — IBUPROFEN 800 MG PO TABS: 800.0000 mg | ORAL_TABLET | Freq: Three times a day (TID) | ORAL | 5 refills | Status: AC | PRN

## 2023-11-20 NOTE — Progress Notes (Signed)
 Subjective:        Angela Doyle is a 38 y.o. female here for a routine exam.  Current complaints: Vaginal discharge.    Personal health questionnaire:  Is patient [name], have a family history of breast and/or ovarian cancer: yes Is there a family history of uterine cancer diagnosed at age < 42, gastrointestinal cancer, urinary tract cancer, family member who is a Personnel officer syndrome-associated carrier: no Is the patient overweight and hypertensive, family history of diabetes, personal history of gestational diabetes, preeclampsia or PCOS: no Is patient over 83, have PCOS,  family history of premature CHD under age 98, diabetes, smoke, have hypertension or peripheral artery disease:  no At any time, has a partner hit, kicked or otherwise hurt or frightened you?: no Over the past 2 weeks, have you felt down, depressed or hopeless?: no Over the past 2 weeks, have you felt little interest or pleasure in doing things?:no   Gynecologic History Patient's last menstrual period was 10/29/2023 (exact date). Contraception: abstinence Last Pap:  05-13-2022  . Results were: NILM with positive High Risk HPV Last mammogram: n/a. Results were: n/a  Obstetric History OB History  Gravida Para Term Preterm AB Living  2 1 1  1 1   SAB IAB Ectopic Multiple Live Births  1    1    # Outcome Date GA Lbr Len/2nd Weight Sex Type Anes PTL Lv  2 Term 03/13/09    M CS-LTranv   LIV  1 SAB  [redacted]w[redacted]d           Past Medical History:  Diagnosis Date   Chronic back pain    Depression    Fibromyalgia    Headache    migraine   Hypercholesterolemia    Neuropathy    Osteoarthritis    Pseudoseizures    Seizure (HCC)    pseudo seizures   TIA (transient ischemic attack)    Vertigo     Past Surgical History:  Procedure Laterality Date   CESAREAN SECTION  2011   OVARIAN CYST SURGERY       Current Outpatient Medications:    ibuprofen  (ADVIL ) 800 MG tablet, Take 1 tablet (800 mg total) by mouth  every 8 (eight) hours as needed., Disp: 30 tablet, Rfl: 5   loratadine  (CLARITIN ) 10 MG tablet, Take 1 tablet (10 mg total) by mouth daily., Disp: 30 tablet, Rfl: 11   oxyCODONE -acetaminophen  (PERCOCET) 7.5-325 MG tablet, Take 1 tablet by mouth 4 (four) times daily as needed., Disp: , Rfl:    tiZANidine  (ZANAFLEX ) 4 MG tablet, TK 1 T PO TID PRN, Disp: , Rfl:    rizatriptan  (MAXALT -MLT) 10 MG disintegrating tablet, Take 1 tablet (10 mg total) by mouth as needed for migraine. May repeat in 2 hours if needed (Patient not taking: Reported on 11/20/2023), Disp: 9 tablet, Rfl: 11   topiramate  (TOPAMAX ) 50 MG tablet, Take 1 tablet (50 mg total) by mouth 2 (two) times daily. (Patient not taking: Reported on 11/20/2023), Disp: 60 tablet, Rfl: 12 Allergies  Allergen Reactions   Pineapple Swelling    Oral swelling    Social History   Tobacco Use   Smoking status: Former    Current packs/day: 0.00    Types: Cigarettes    Quit date: 2023    Years since quitting: 2.7   Smokeless tobacco: Never   Tobacco comments:    4-5 per day / Stopped 2 months 06/29/23  Substance Use Topics   Alcohol use: Yes  Alcohol/week: 0.0 standard drinks of alcohol    Comment: occ    Family History  Problem Relation Age of Onset   Neuropathy Mother    Arthritis Mother    Hypertension Mother    Heart disease Maternal Grandmother    Diabetes Maternal Grandmother    Breast cancer Maternal Aunt       Review of Systems  Constitutional: negative for fatigue and weight loss Respiratory: negative for cough and wheezing Cardiovascular: negative for chest pain, fatigue and palpitations Gastrointestinal: negative for abdominal pain and change in bowel habits Musculoskeletal:negative for myalgias Neurological: negative for gait problems and tremors Behavioral/Psych: negative for abusive relationship, depression Endocrine: negative for temperature intolerance    Genitourinary: positive for vaginal discharge.  negative for  abnormal menstrual periods, genital lesions, hot flashes, sexual problems  Integument/breast: negative for breast lump, breast tenderness, nipple discharge and skin lesion(s)    Objective:       BP 114/74   Pulse 74   Ht 5' 2 (1.575 m)   Wt 217 lb 1.6 oz (98.5 kg)   LMP 10/29/2023 (Exact Date)   BMI 39.71 kg/m  General:   Alert and no distress  Skin:   no rash or abnormalities  Lungs:   clear to auscultation bilaterally  Heart:   regular rate and rhythm, S1, S2 normal, no murmur, click, rub or gallop  Breasts:   normal without suspicious masses, skin or nipple changes or axillary nodes  Abdomen:  normal findings: no organomegaly, soft, non-tender and no hernia  Pelvis:  External genitalia: normal general appearance Urinary system: urethral meatus normal and bladder without fullness, nontender Vaginal: normal without tenderness, induration or masses Cervix: normal appearance Adnexa: normal bimanual exam Uterus: anteverted and non-tender, normal size   Lab Review Urine pregnancy test Labs reviewed yes Radiologic studies reviewed no  I have spent a total of 20 minutes of face-to-face time, excluding clinical staff time, reviewing notes and preparing to see patient, ordering tests and/or medications, and counseling the patient.   Assessment:    1. Encounter for gynecological examination with Papanicolaou smear of cervix (Primary) Rx: - Cytology - PAP( [place]) - Cervicovaginal ancillary only( [place])  2. Dysmenorrhea Rx: - ibuprofen  (ADVIL ) 800 MG tablet; Take 1 tablet (800 mg total) by mouth every 8 (eight) hours as needed.  Dispense: 30 tablet; Refill: 5  3. Encounter for other general counseling and advice on contraception - declines contraception.  She is abstinent   4. Class 3 severe obesity without serious comorbidity with body mass index (BMI) of 40.0 to 44.9 in adult, unspecified obesity type - weight reduction with the aid of dietary changs, exercise  and behavioral modification recommended     Plan:    Education reviewed: calcium supplements, depression evaluation, low fat, low cholesterol diet, safe sex/STD prevention, self breast exams, and weight bearing exercise. Contraception: abstinence. Follow up in: 1 year.   Meds ordered this encounter  Medications   ibuprofen  (ADVIL ) 800 MG tablet    Sig: Take 1 tablet (800 mg total) by mouth every 8 (eight) hours as needed.    Dispense:  30 tablet    Refill:  5     CARLIN RONAL CENTERS, MD, FACOG Attending Obstetrician & Gynecologist, [facility] for [facility], [facility], [place] 11/20/2023

## 2023-11-20 NOTE — Progress Notes (Signed)
 Pt presents for annual. Pt declines std testing. Pt has no questions or concerns at this time.

## 2023-11-21 LAB — CERVICOVAGINAL ANCILLARY ONLY
Bacterial Vaginitis (gardnerella): POSITIVE — AB
Candida Glabrata: NEGATIVE
Candida Vaginitis: NEGATIVE
Chlamydia: NEGATIVE
Comment: NEGATIVE
Comment: NEGATIVE
Comment: NEGATIVE
Comment: NEGATIVE
Comment: NEGATIVE
Comment: NORMAL
Neisseria Gonorrhea: NEGATIVE
Trichomonas: NEGATIVE

## 2023-11-22 ENCOUNTER — Ambulatory Visit: Payer: Self-pay | Admitting: Obstetrics

## 2023-11-22 ENCOUNTER — Other Ambulatory Visit: Payer: Self-pay

## 2023-11-22 DIAGNOSIS — N760 Acute vaginitis: Secondary | ICD-10-CM

## 2023-11-22 MED ORDER — METRONIDAZOLE 500 MG PO TABS: 500.0000 mg | ORAL_TABLET | Freq: Two times a day (BID) | ORAL | 0 refills | Status: AC

## 2023-11-22 MED ORDER — METRONIDAZOLE 500 MG PO TABS
500.0000 mg | ORAL_TABLET | Freq: Two times a day (BID) | ORAL | 2 refills | Status: AC
Start: 2023-11-22 — End: ?

## 2023-11-24 ENCOUNTER — Other Ambulatory Visit: Payer: Self-pay | Admitting: Obstetrics

## 2023-11-24 LAB — CYTOLOGY - PAP
Adequacy: ABSENT
Comment: NEGATIVE
Diagnosis: NEGATIVE
Diagnosis: REACTIVE
High risk HPV: NEGATIVE

## 2024-01-17 ENCOUNTER — Encounter: Payer: Self-pay | Admitting: Neurology

## 2024-04-03 ENCOUNTER — Encounter (HOSPITAL_BASED_OUTPATIENT_CLINIC_OR_DEPARTMENT_OTHER): Payer: Self-pay

## 2024-04-03 ENCOUNTER — Emergency Department (HOSPITAL_BASED_OUTPATIENT_CLINIC_OR_DEPARTMENT_OTHER)

## 2024-04-03 ENCOUNTER — Other Ambulatory Visit: Payer: Self-pay

## 2024-04-03 ENCOUNTER — Emergency Department (HOSPITAL_BASED_OUTPATIENT_CLINIC_OR_DEPARTMENT_OTHER)
Admission: EM | Admit: 2024-04-03 | Discharge: 2024-04-03 | Disposition: A | Discharge status: Home / Self Care | Attending: Emergency Medicine | Admitting: Emergency Medicine

## 2024-04-03 DIAGNOSIS — M5412 Radiculopathy, cervical region: Secondary | ICD-10-CM | POA: Insufficient documentation

## 2024-04-03 MED ORDER — PREDNISONE 10 MG (21) PO TBPK: ORAL_TABLET | Freq: Every day | ORAL | 0 refills | Status: AC

## 2024-04-03 MED ORDER — KETOROLAC TROMETHAMINE 15 MG/ML IJ SOLN
15.0000 mg | Freq: Once | INTRAMUSCULAR | Status: AC
  Administered 2024-04-03: 15 mg via INTRAMUSCULAR
  Filled 2024-04-03: qty 1

## 2024-04-03 NOTE — ED Notes (Signed)
 Report received from [place], [place]. Assuming patient care at this time.

## 2024-04-03 NOTE — ED Triage Notes (Signed)
 Pt voices left shoulder pain x 2 weeks. States that she is unsure of what happened. Pain is keeping her up at night.

## 2024-04-03 NOTE — ED Provider Notes (Signed)
 " [place] [facility] Provider Note   CSN: [id number] Arrival date & time: 04/03/24  1118     Patient presents with: Shoulder Pain   Angela Doyle is a 39 y.o. female.   39 year old female presents today for concern of cervical radiculopathy on the left side.  She has history of cervical stenosis.  Symptoms have been ongoing for the past 2 weeks.  No weakness.  Has not taken anything prior to arrival.  Initially she thought it was her shoulder that was causing the symptoms but later realized that it likely is coming from her cervical spine.  Has history of the similar symptoms on the right side but never on the left side before.  Has previously undergone procedure for this by a neurologist.  Does not have a spine specialist that she sees.  The history is provided by the patient. No language interpreter was used.       Prior to Admission medications  Medication Sig Start Date End Date Taking? Authorizing Provider  ibuprofen  (ADVIL ) 800 MG tablet Take 1 tablet (800 mg total) by mouth every 8 (eight) hours as needed. 11/20/23   Rudy Carlin LABOR, MD  loratadine  (CLARITIN ) 10 MG tablet Take 1 tablet (10 mg total) by mouth daily. 07/31/20   Rudy Carlin LABOR, MD  metroNIDAZOLE  (FLAGYL ) 500 MG tablet Take 1 tablet (500 mg total) by mouth 2 (two) times daily. 11/22/23   Rudy Carlin LABOR, MD  metroNIDAZOLE  (FLAGYL ) 500 MG tablet Take 1 tablet (500 mg total) by mouth 2 (two) times daily. 11/22/23   Rudy Carlin LABOR, MD  oxyCODONE -acetaminophen  (PERCOCET) 7.5-325 MG tablet Take 1 tablet by mouth 4 (four) times daily as needed. 06/10/22   [provider]  rizatriptan  (MAXALT -MLT) 10 MG disintegrating tablet Take 1 tablet (10 mg total) by mouth as needed for migraine. May repeat in 2 hours if needed Patient not taking: Reported on 11/20/2023 06/29/22   Georjean Darice HERO, MD  tiZANidine  (ZANAFLEX ) 4 MG tablet TK 1 T PO TID PRN 09/29/18   [provider]   topiramate  (TOPAMAX ) 50 MG tablet Take 1 tablet (50 mg total) by mouth 2 (two) times daily. Patient not taking: Reported on 11/20/2023 06/29/23   Georjean Darice HERO, MD    Allergies: Pineapple    Review of Systems  Constitutional:  Negative for chills and fever.  Musculoskeletal:  Positive for arthralgias. Negative for neck pain.  All other systems reviewed and are negative.   Updated Vital Signs BP 129/88 (BP Location: Right Arm)   Pulse 64   Resp 16   LMP 03/11/2024   SpO2 100%   Physical Exam Vitals and nursing note reviewed.  Constitutional:      General: She is not in acute distress.    Appearance: Normal appearance. She is not ill-appearing.  HENT:     Head: Normocephalic and atraumatic.     Nose: Nose normal.  Eyes:     Conjunctiva/sclera: Conjunctivae normal.  Pulmonary:     Effort: Pulmonary effort is normal. No respiratory distress.  Musculoskeletal:        General: No deformity.     Comments: Cervical, thoracic, lumbar spine without tenderness to palpation.  No step-offs.  Spine well aligned.  Good strength in bilateral upper extremities in extensor and flexor muscle groups.  Skin:    Findings: No rash.  Neurological:     Mental Status: She is alert.     (all labs ordered are listed,  but only abnormal results are displayed) Labs Reviewed - No data to display  EKG: None  Radiology: DG Shoulder Left Result Date: 04/03/2024 CLINICAL DATA:  Left shoulder pain. EXAM: LEFT SHOULDER - 2+ VIEW COMPARISON:  None Available. FINDINGS: No acute osseous joint abnormality. No degenerative changes. Visualized left chest is grossly unremarkable. IMPRESSION: Negative. Electronically Signed   By: Newell Eke M.D.   On: 04/03/2024 12:28     Procedures   Medications Ordered in the ED - No data to display                                  Medical Decision Making Amount and/or Complexity of Data Reviewed Radiology: ordered.   39 year old female presents today for  concern of cervical radiculopathy in the left arm.  No weakness.  Neurovascularly intact.  Has not taken anything prior to.  She is not a diabetic.  Will prescribe her steroid course and have her follow-up with the spine clinic.  Patient is in agreement with this plan.  Strict return precautions discussed.  No weakness on exam. No trauma to her cervical spine.  No headache.  Patient discharged in stable condition.   Final diagnoses:  Cervical radiculopathy    ED Discharge Orders          Ordered    predniSONE  (STERAPRED UNI-PAK 21 TAB) 10 MG (21) TBPK tablet  Daily        04/03/24 1322               Hildegard Loge, PA-C 04/03/24 1322    Towana Ozell BROCKS, MD 04/03/24 1701  "

## 2024-04-03 NOTE — Discharge Instructions (Signed)
 Likely has cervical radiculopathy.  Take prednisone  as directed.  Follow-up with the spine clinic as listed above.  Return for any emergent symptoms.  X-ray did not show any worrisome findings.

## 2024-06-28 ENCOUNTER — Ambulatory Visit: Admitting: Neurology
